# Patient Record
Sex: Male | Born: 1955 | Race: White | Hispanic: No | Marital: Single | Smoking: Former smoker
Health system: Southern US, Community
[De-identification: ages and names within clinical notes are randomized; demographics above are authoritative.]

## PROBLEM LIST (undated history)

## (undated) DIAGNOSIS — F419 Anxiety disorder, unspecified: Secondary | ICD-10-CM

## (undated) DIAGNOSIS — E079 Disorder of thyroid, unspecified: Secondary | ICD-10-CM

## (undated) DIAGNOSIS — C801 Malignant (primary) neoplasm, unspecified: Secondary | ICD-10-CM

## (undated) DIAGNOSIS — K219 Gastro-esophageal reflux disease without esophagitis: Secondary | ICD-10-CM

## (undated) DIAGNOSIS — F1010 Alcohol abuse, uncomplicated: Secondary | ICD-10-CM

## (undated) DIAGNOSIS — G4730 Sleep apnea, unspecified: Secondary | ICD-10-CM

## (undated) DIAGNOSIS — I10 Essential (primary) hypertension: Secondary | ICD-10-CM

## (undated) DIAGNOSIS — E785 Hyperlipidemia, unspecified: Secondary | ICD-10-CM

## (undated) HISTORY — PX: COLONOSCOPY: SHX5424

## (undated) HISTORY — DX: Hyperlipidemia, unspecified: E78.5

## (undated) HISTORY — DX: Essential (primary) hypertension: I10

## (undated) HISTORY — DX: Anxiety disorder, unspecified: F41.9

## (undated) HISTORY — DX: Disorder of thyroid, unspecified: E07.9

## (undated) HISTORY — PX: UPPER GASTROINTESTINAL ENDOSCOPY: SHX188

## (undated) HISTORY — DX: Gastro-esophageal reflux disease without esophagitis: K21.9

## (undated) HISTORY — PX: THYROIDECTOMY: SHX17

---

## 2011-03-31 DIAGNOSIS — C73 Malignant neoplasm of thyroid gland: Secondary | ICD-10-CM | POA: Insufficient documentation

## 2011-10-01 DIAGNOSIS — K279 Peptic ulcer, site unspecified, unspecified as acute or chronic, without hemorrhage or perforation: Secondary | ICD-10-CM

## 2011-10-01 HISTORY — DX: Peptic ulcer, site unspecified, unspecified as acute or chronic, without hemorrhage or perforation: K27.9

## 2016-04-30 HISTORY — PX: COLONOSCOPY: SHX174

## 2017-05-19 ENCOUNTER — Emergency Department (HOSPITAL_COMMUNITY)
Admission: EM | Admit: 2017-05-19 | Discharge: 2017-05-19 | Disposition: A | Discharge status: Home / Self Care | Payer: BC Managed Care – PPO | Attending: Emergency Medicine | Admitting: Emergency Medicine

## 2017-05-19 ENCOUNTER — Encounter (HOSPITAL_COMMUNITY): Payer: Self-pay | Admitting: Emergency Medicine

## 2017-05-19 DIAGNOSIS — F17210 Nicotine dependence, cigarettes, uncomplicated: Secondary | ICD-10-CM | POA: Insufficient documentation

## 2017-05-19 DIAGNOSIS — F1010 Alcohol abuse, uncomplicated: Secondary | ICD-10-CM | POA: Insufficient documentation

## 2017-05-19 DIAGNOSIS — F329 Major depressive disorder, single episode, unspecified: Secondary | ICD-10-CM | POA: Diagnosis not present

## 2017-05-19 DIAGNOSIS — R260 Ataxic gait: Secondary | ICD-10-CM | POA: Diagnosis present

## 2017-05-19 HISTORY — DX: Alcohol abuse, uncomplicated: F10.10

## 2017-05-19 LAB — ETHANOL: Alcohol, Ethyl (B): 394 mg/dL (ref <5)

## 2017-05-19 LAB — COMPREHENSIVE METABOLIC PANEL
ALBUMIN: 4.9 g/dL (ref 3.5–5.0)
ALK PHOS: 59 U/L (ref 38–126)
ALT: 37 U/L (ref 17–63)
ANION GAP: 20 — AB (ref 5–15)
AST: 35 U/L (ref 15–41)
BUN: 16 mg/dL (ref 6–20)
CALCIUM: 9.3 mg/dL (ref 8.9–10.3)
CO2: 18 mmol/L — AB (ref 22–32)
Chloride: 101 mmol/L (ref 101–111)
Creatinine, Ser: 0.76 mg/dL (ref 0.61–1.24)
GFR calc Af Amer: >60 mL/min (ref >60)
GFR calc non Af Amer: >60 mL/min (ref >60)
GLUCOSE: 95 mg/dL (ref 65–99)
Potassium: 3.8 mmol/L (ref 3.5–5.1)
SODIUM: 139 mmol/L (ref 135–145)
Total Bilirubin: 0.8 mg/dL (ref 0.3–1.2)
Total Protein: 8.3 g/dL — ABNORMAL HIGH (ref 6.5–8.1)

## 2017-05-19 LAB — CBC
HCT: 45.1 % (ref 39.0–52.0)
HEMOGLOBIN: 16.7 g/dL (ref 13.0–17.0)
MCH: 33.2 pg (ref 26.0–34.0)
MCHC: 37 g/dL — ABNORMAL HIGH (ref 30.0–36.0)
MCV: 89.7 fL (ref 78.0–100.0)
Platelets: 303 10*3/uL (ref 150–400)
RBC: 5.03 MIL/uL (ref 4.22–5.81)
RDW: 12.9 % (ref 11.5–15.5)
WBC: 10.7 10*3/uL — ABNORMAL HIGH (ref 4.0–10.5)

## 2017-05-19 NOTE — Discharge Instructions (Signed)
Follow up with out patient treatment of alcohol abuse
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
## 2017-05-19 NOTE — ED Notes (Signed)
MD made aware that pt has arrived.

## 2017-05-19 NOTE — ED Notes (Signed)
Bed: [facility] Expected date:  Expected time:  Means of arrival:  Comments: 

## 2017-05-19 NOTE — ED Notes (Signed)
Spoke with male officer who verbalizes en route with pt to hospital.

## 2017-05-19 NOTE — ED Notes (Addendum)
Spoke with Roderic Palau, MD regarding pt status and complaint. Verbal to place CMP, CBC, and ethanol level.   Pt ambulated with steady gait to consultation room for blood draw. Pt tearful.

## 2017-05-19 NOTE — ED Provider Notes (Signed)
[name]   CSN: [id number] Arrival date & time: 05/19/17  1133     History   Chief Complaint Chief Complaint  Patient presents with  . Alcohol Intoxication    HPI Vernon Fowler is a 61 y.o. male.  Patient brought in by police for alcohol abuse.   The history is provided by the patient. No language interpreter was used.  Alcohol Intoxication  This is a chronic problem. The current episode started more than 1 week ago. The problem occurs constantly. The problem has not changed since onset.Pertinent negatives include no chest pain, no abdominal pain and no headaches. Nothing aggravates the symptoms. Nothing relieves the symptoms.    Past Medical History:  Diagnosis Date  . Alcohol abuse     There are no active problems to display for this patient.   History reviewed. No pertinent surgical history.     Home Medications    Prior to Admission medications   Not on File    Family History No family history on file.  Social History Social History  Substance Use Topics  . Smoking status: Current Every Day Smoker    Types: Cigarettes  . Smokeless tobacco: Not on file  . Alcohol use Yes     Allergies   Patient has no known allergies.   Review of Systems Review of Systems  Constitutional: Negative for appetite change and fatigue.  HENT: Negative for congestion, ear discharge and sinus pressure.   Eyes: Negative for discharge.  Respiratory: Negative for cough.   Cardiovascular: Negative for chest pain.  Gastrointestinal: Negative for abdominal pain and diarrhea.  Genitourinary: Negative for frequency and hematuria.  Musculoskeletal: Negative for back pain.  Skin: Negative for rash.  Neurological: Negative for seizures and headaches.  Psychiatric/Behavioral: Positive for dysphoric mood. Negative for hallucinations.     Physical Exam Updated Vital Signs BP 127/86 (BP Location: Right Arm)   Pulse 78   Temp 98.3 F (36.8 C)  (Oral)   Resp 16   SpO2 94%   Physical Exam  Constitutional: He is oriented to person, place, and time. He appears well-developed.  HENT:  Head: Normocephalic.  Eyes: Conjunctivae and EOM are normal. No scleral icterus.  Neck: Neck supple. No thyromegaly present.  Cardiovascular: Normal rate and regular rhythm.  Exam reveals no gallop and no friction rub.   No murmur heard. Pulmonary/Chest: No stridor. He has no wheezes. He has no rales. He exhibits no tenderness.  Abdominal: He exhibits no distension. There is no tenderness. There is no rebound.  Musculoskeletal: Normal range of motion. He exhibits no edema.  Lymphadenopathy:    He has no cervical adenopathy.  Neurological: He is oriented to person, place, and time. He exhibits normal muscle tone. Coordination normal.  Mildly ataxic  Skin: No rash noted. No erythema.  Psychiatric:  Patient depressed but not suicidal or homicidal     ED Treatments / Results  Labs (all labs ordered are listed, but only abnormal results are displayed) Labs Reviewed  CBC - Abnormal; Notable for the following:       Result Value   WBC 10.7 (*)    MCHC 37.0 (*)    All other components within normal limits  COMPREHENSIVE METABOLIC PANEL - Abnormal; Notable for the following:    CO2 18 (*)    Total Protein 8.3 (*)    Anion gap 20 (*)    All other components within normal limits  ETHANOL - Abnormal; Notable for the following:  Alcohol, Ethyl (B) 394 (*)    All other components within normal limits    EKG  EKG Interpretation None       Radiology No results found.  Procedures Procedures (including critical care time)  Medications Ordered in ED Medications - No data to display   Initial Impression / Assessment and Plan / ED Course  I have reviewed the triage vital signs and the nursing notes.  Pertinent labs & imaging results that were available during my care of the patient were reviewed by me and considered in my medical  decision making (see chart for details).     Patient with alcohol abuse. He is going be taken away by the police. And then will follow-up for alcohol abuse evident outpatient  Final Clinical Impressions(s) / ED Diagnoses   Final diagnoses:  ETOH abuse    New Prescriptions New Prescriptions   No medications on file     Milton Ferguson, MD 05/19/17 1434

## 2017-05-19 NOTE — ED Notes (Addendum)
Per Beulah Gandy, [facility] police left with pt after blood draw. This Probation officer informed Rosita Fire, EMT and Oran Rein, off duty [employer] that pt is not medically cleared by EDP. Oran Rein, off duty [employer] calling info channel to inform pt is not medically cleared from hospital. Hanley Seamen, charge RN and Roderic Palau, MD aware of all the above.

## 2017-06-10 ENCOUNTER — Other Ambulatory Visit (HOSPITAL_COMMUNITY): Payer: Self-pay | Admitting: Ophthalmology

## 2017-06-10 ENCOUNTER — Ambulatory Visit (HOSPITAL_COMMUNITY)
Admission: RE | Admit: 2017-06-10 | Discharge: 2017-06-10 | Disposition: A | Discharge status: Home / Self Care | Payer: BC Managed Care – PPO | Source: Ambulatory Visit | Attending: Cardiology | Admitting: Cardiology

## 2017-06-10 DIAGNOSIS — H3582 Retinal ischemia: Secondary | ICD-10-CM

## 2017-06-10 DIAGNOSIS — H539 Unspecified visual disturbance: Secondary | ICD-10-CM

## 2017-06-10 DIAGNOSIS — I6523 Occlusion and stenosis of bilateral carotid arteries: Secondary | ICD-10-CM | POA: Insufficient documentation

## 2017-10-15 ENCOUNTER — Encounter (HOSPITAL_COMMUNITY): Payer: Self-pay | Admitting: Emergency Medicine

## 2017-10-15 ENCOUNTER — Emergency Department (HOSPITAL_COMMUNITY)
Admission: EM | Admit: 2017-10-15 | Discharge: 2017-10-15 | Disposition: A | Discharge status: Home / Self Care | Payer: BC Managed Care – PPO | Attending: Emergency Medicine | Admitting: Emergency Medicine

## 2017-10-15 DIAGNOSIS — Z7982 Long term (current) use of aspirin: Secondary | ICD-10-CM | POA: Diagnosis not present

## 2017-10-15 DIAGNOSIS — F17210 Nicotine dependence, cigarettes, uncomplicated: Secondary | ICD-10-CM | POA: Diagnosis not present

## 2017-10-15 DIAGNOSIS — Z79899 Other long term (current) drug therapy: Secondary | ICD-10-CM | POA: Insufficient documentation

## 2017-10-15 DIAGNOSIS — R0789 Other chest pain: Secondary | ICD-10-CM | POA: Diagnosis not present

## 2017-10-15 DIAGNOSIS — Y908 Blood alcohol level of 240 mg/100 ml or more: Secondary | ICD-10-CM | POA: Diagnosis not present

## 2017-10-15 DIAGNOSIS — F1010 Alcohol abuse, uncomplicated: Secondary | ICD-10-CM | POA: Diagnosis not present

## 2017-10-15 DIAGNOSIS — F1020 Alcohol dependence, uncomplicated: Secondary | ICD-10-CM | POA: Diagnosis present

## 2017-10-15 LAB — RAPID URINE DRUG SCREEN, HOSP PERFORMED
Amphetamines: NOT DETECTED
Barbiturates: NOT DETECTED
Benzodiazepines: NOT DETECTED
Cocaine: NOT DETECTED
Opiates: NOT DETECTED
Tetrahydrocannabinol: NOT DETECTED

## 2017-10-15 LAB — COMPREHENSIVE METABOLIC PANEL
ALBUMIN: 5.1 g/dL — AB (ref 3.5–5.0)
ALK PHOS: 57 U/L (ref 38–126)
ALT: 58 U/L (ref 17–63)
AST: 51 U/L — AB (ref 15–41)
Anion gap: 17 — ABNORMAL HIGH (ref 5–15)
BUN: 14 mg/dL (ref 6–20)
CALCIUM: 9.3 mg/dL (ref 8.9–10.3)
CO2: 21 mmol/L — ABNORMAL LOW (ref 22–32)
CREATININE: 0.79 mg/dL (ref 0.61–1.24)
Chloride: 100 mmol/L — ABNORMAL LOW (ref 101–111)
GFR calc non Af Amer: >60 mL/min (ref >60)
GLUCOSE: 95 mg/dL (ref 65–99)
Potassium: 4 mmol/L (ref 3.5–5.1)
Sodium: 138 mmol/L (ref 135–145)
Total Bilirubin: 1.2 mg/dL (ref 0.3–1.2)
Total Protein: 8.4 g/dL — ABNORMAL HIGH (ref 6.5–8.1)

## 2017-10-15 LAB — ETHANOL: Alcohol, Ethyl (B): 270 mg/dL — ABNORMAL HIGH (ref <10)

## 2017-10-15 LAB — CBC
HCT: 43.9 % (ref 39.0–52.0)
Hemoglobin: 16 g/dL (ref 13.0–17.0)
MCH: 34.3 pg — AB (ref 26.0–34.0)
MCHC: 36.4 g/dL — AB (ref 30.0–36.0)
MCV: 94 fL (ref 78.0–100.0)
PLATELETS: 300 10*3/uL (ref 150–400)
RBC: 4.67 MIL/uL (ref 4.22–5.81)
RDW: 14.3 % (ref 11.5–15.5)
WBC: 6.4 10*3/uL (ref 4.0–10.5)

## 2017-10-15 LAB — TROPONIN I

## 2017-10-15 MED ORDER — CHLORDIAZEPOXIDE HCL 25 MG PO CAPS: ORAL_CAPSULE | ORAL | 0 refills | Status: DC

## 2017-10-15 MED ORDER — LORAZEPAM 1 MG PO TABS
1.0000 mg | ORAL_TABLET | Freq: Once | ORAL | Status: AC
  Administered 2017-10-15: 1 mg via ORAL
  Filled 2017-10-15: qty 1

## 2017-10-15 NOTE — Patient Outreach (Signed)
CPSS met with the patient and provided substance use recovery support. Patient states his plan after discharge is to attend more AA meetings and get connected with a good sponsor. Patient had 6 months sober attending AA/NA meetings before recently relapsing. Patient received inpatient substance use treatment earlier last year at [facility]. CPSS utilized motivational interviewing skills to highlight patients strengths and hope for his substance use recovery. CPSS also provided CPSS contact information and encouraged the patient to contact CPSS at anytime for substance use recovery support.

## 2017-10-15 NOTE — ED Notes (Signed)
Pt discharged to home. DC instructions given. Pt received discharge prescription up front in the Main ED. Left unit ambulatory and stable. No concerns voiced. Hale Bogus.

## 2017-10-15 NOTE — Discharge Instructions (Signed)
As discussed, your evaluation today has been largely reassuring.  But, it is important that you monitor your condition carefully, and do not hesitate to return to the ED if you develop new, or concerning changes in your condition. ? ?Otherwise, please follow-up with your physician for appropriate ongoing care. ? ?
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
## 2017-10-15 NOTE — ED Triage Notes (Signed)
Patient here from home with complaints of etoh. Patient requesting detox. States that he has a sponsor at [facility] and was sober. States that when he detox he does not have seizures. Denies SI/HI. Last drink last night.

## 2017-10-15 NOTE — ED Notes (Signed)
Bed: WA32 Expected date:  Expected time:  Means of arrival:  Comments: 

## 2017-10-28 ENCOUNTER — Encounter: Payer: Self-pay | Admitting: Physician Assistant

## 2017-10-28 ENCOUNTER — Ambulatory Visit: Payer: BC Managed Care – PPO | Admitting: Physician Assistant

## 2017-10-28 ENCOUNTER — Other Ambulatory Visit: Payer: Self-pay

## 2017-10-28 VITALS — BP 132/70 | HR 69 | Temp 97.9°F | Resp 18 | Ht 74.0 in | Wt 209.8 lb

## 2017-10-28 DIAGNOSIS — K219 Gastro-esophageal reflux disease without esophagitis: Secondary | ICD-10-CM | POA: Diagnosis not present

## 2017-10-28 DIAGNOSIS — F411 Generalized anxiety disorder: Secondary | ICD-10-CM | POA: Diagnosis not present

## 2017-10-28 DIAGNOSIS — F1010 Alcohol abuse, uncomplicated: Secondary | ICD-10-CM | POA: Diagnosis not present

## 2017-10-28 DIAGNOSIS — I10 Essential (primary) hypertension: Secondary | ICD-10-CM

## 2017-10-28 MED ORDER — BUSPIRONE HCL 10 MG PO TABS: 10.0000 mg | ORAL_TABLET | Freq: Three times a day (TID) | ORAL | 0 refills | Status: DC

## 2017-10-28 NOTE — Patient Instructions (Addendum)
Please download the APP called mychart - then use the text to activate this APP - this will allow you to look at your labs and contact me as well as make appointments to see me in the future.     IF you received an x-ray today, you will receive an invoice from [facility]. Please contact [facility] at [phone] with questions or concerns regarding your invoice.   IF you received labwork today, you will receive an invoice from [facility]. Please contact [facility] at [phone] with questions or concerns regarding your invoice.   Our billing staff will not be able to assist you with questions regarding bills from these companies.  You will be contacted with the lab results as soon as they are available. The fastest way to get your results is to activate your My Chart account. Instructions are located on the last page of this paperwork. If you have not heard from us regarding the results in 2 weeks, please contact this office.     
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
## 2017-11-23 ENCOUNTER — Other Ambulatory Visit: Payer: Self-pay | Admitting: Physician Assistant

## 2017-11-23 DIAGNOSIS — F411 Generalized anxiety disorder: Secondary | ICD-10-CM

## 2017-12-09 ENCOUNTER — Encounter (HOSPITAL_COMMUNITY): Payer: Self-pay | Admitting: Psychology

## 2017-12-09 ENCOUNTER — Ambulatory Visit (INDEPENDENT_AMBULATORY_CARE_PROVIDER_SITE_OTHER): Payer: BC Managed Care – PPO | Admitting: Psychology

## 2017-12-09 DIAGNOSIS — F1020 Alcohol dependence, uncomplicated: Secondary | ICD-10-CM | POA: Diagnosis not present

## 2017-12-09 NOTE — Addendum Note (Signed)
Addended byAmalia Hailey, Oaklie Durrett on: 12/09/2017 01:30 PM   Modules accepted: Level of Service
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
## 2017-12-12 ENCOUNTER — Other Ambulatory Visit (HOSPITAL_COMMUNITY): Payer: BC Managed Care – PPO | Attending: Psychiatry | Admitting: Psychology

## 2017-12-12 ENCOUNTER — Encounter (HOSPITAL_COMMUNITY): Payer: Self-pay | Admitting: Psychology

## 2017-12-12 DIAGNOSIS — F1020 Alcohol dependence, uncomplicated: Secondary | ICD-10-CM | POA: Diagnosis present

## 2017-12-13 ENCOUNTER — Encounter (HOSPITAL_COMMUNITY): Payer: Self-pay | Admitting: Psychology
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
## 2017-12-13 NOTE — Progress Notes (Signed)
    Daily Group Progress Note  Program: CD-IOP   12/13/2017 Vernon Fowler [id number]  Diagnosis: F10.20 Alcohol Use Disorder  Sobriety Date: 12/12/17  Group Time: 1-2:30pm  Participation Level: Active  Behavioral Response: Sharing  Type of Therapy: Process Group  Interventions: Supportive  Topic: Process: the first part of group was spent in process. Members shared about the past weekend and any challenges or 'speed bumps' they may have faced in early recovery. A new group member was present, and he introduced himself during this half of group and shared about his struggles with alcoholism. The medical director met with one group member for her initial session and another for a medication check. Six random drug tests were collected.   Group Time: 2:30-4pm  Participation Level: Active  Behavioral Response: Sharing  Type of Therapy: Psycho-education Group  Interventions: Communication  Topic: Psycho-Ed: Communication: The four communication styles: identifying your style. The second half of group was spent in a psycho-ed on communication. A handout was provided identifying the four primary styles, including passive, passive-aggressive, aggressive and, assertive. Members shared in reading the different styles and sharing about their role models in communication styles. Most of the group had poor models in their families and aggressive was the primary style identified. The discussion was lively, and members shared in greater detail about the way they first learned to communicate.  Summary: The patient was new to the group and introduced himself in the first half of group. The patient shared that he was a kind of expert on residential treatment having been in three facilities in the last 12 months. He has been sober for almost seven months, but most of that sober time was while he was in treatment. He reported he had relapsed after 121 days of sobriety. He identified his challenge is  to build accountability and keep busy. He lives alone and is not working. "I isolate", he explained. Patient also shared about his separation from his wife and her refusal to let him have his dog. The patent admitted he had not attended any meetings on Saturday or Sunday and had drank on both days. He attributed his drinking to 'sports'. He had watched the Master's both days and drank. 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 He believes that this is part of his issue. The patient provided helpful feedback to another group member and responded well in his first group session.   UDS collected: Yes Results: pending  AA/NA attended?: No  Sponsor?: Yes   Brandon Melnick, LCAS 12/13/2017 2:08 PM

## 2017-12-14 ENCOUNTER — Other Ambulatory Visit (HOSPITAL_COMMUNITY): Payer: BC Managed Care – PPO

## 2017-12-15 ENCOUNTER — Telehealth (HOSPITAL_COMMUNITY): Payer: Self-pay | Admitting: Psychology

## 2017-12-15 ENCOUNTER — Other Ambulatory Visit (HOSPITAL_COMMUNITY): Payer: BC Managed Care – PPO

## 2017-12-19 ENCOUNTER — Telehealth (HOSPITAL_COMMUNITY): Payer: Self-pay | Admitting: Psychology

## 2017-12-19 ENCOUNTER — Other Ambulatory Visit (HOSPITAL_COMMUNITY): Payer: BC Managed Care – PPO

## 2017-12-20 ENCOUNTER — Other Ambulatory Visit: Payer: Self-pay | Admitting: Physician Assistant

## 2017-12-20 DIAGNOSIS — F411 Generalized anxiety disorder: Secondary | ICD-10-CM

## 2017-12-21 ENCOUNTER — Other Ambulatory Visit (HOSPITAL_COMMUNITY): Payer: BC Managed Care – PPO

## 2017-12-22 ENCOUNTER — Other Ambulatory Visit (HOSPITAL_COMMUNITY): Payer: BC Managed Care – PPO

## 2017-12-23 DIAGNOSIS — M720 Palmar fascial fibromatosis [Dupuytren]: Secondary | ICD-10-CM | POA: Insufficient documentation

## 2017-12-23 HISTORY — DX: Palmar fascial fibromatosis (dupuytren): M72.0

## 2017-12-24 ENCOUNTER — Other Ambulatory Visit: Payer: Self-pay | Admitting: Physician Assistant

## 2017-12-24 DIAGNOSIS — F411 Generalized anxiety disorder: Secondary | ICD-10-CM

## 2017-12-26 ENCOUNTER — Other Ambulatory Visit (HOSPITAL_COMMUNITY): Payer: BC Managed Care – PPO | Admitting: Medical

## 2017-12-27 ENCOUNTER — Telehealth (HOSPITAL_COMMUNITY): Payer: Self-pay | Admitting: Psychology

## 2017-12-28 ENCOUNTER — Other Ambulatory Visit (INDEPENDENT_AMBULATORY_CARE_PROVIDER_SITE_OTHER): Payer: BC Managed Care – PPO | Admitting: Medical

## 2017-12-28 ENCOUNTER — Other Ambulatory Visit (HOSPITAL_COMMUNITY): Payer: Self-pay | Admitting: Medical

## 2017-12-28 DIAGNOSIS — F1020 Alcohol dependence, uncomplicated: Secondary | ICD-10-CM

## 2017-12-28 NOTE — Progress Notes (Signed)
  [place] Dependency Intensive Outpatient Discharge Summary   Chue Berkovich [id number]  Date of Admission: 12/26/2017 Date of Discharge: 12/28/2017  Course of Treatment: Pt withdrew after 1 Group citing need to attend to outside issues  Goals and Activities to Help Maintain Sobriety: 1. Stay away from old friends who continue to drink and use mind-altering chemicals. 2. Continue practicing Fair Fighting rules in interpersonal conflicts. 3. Continue alcohol and drug refusal skills and call on support systems.  Referrals: NA   Aftercare services: NA 1. Attend AA/NA meetings as ofter as you used 2. Obtain a sponsor and a home group in [place].    Client has NOT participated in the development of this discharge plan and has NOT received a copy of this completed plan  Darlyne Russian  12/28/2017   Darlyne Russian, PA-C 12/28/2017

## 2017-12-29 ENCOUNTER — Other Ambulatory Visit (HOSPITAL_COMMUNITY): Payer: BC Managed Care – PPO

## 2017-12-29 ENCOUNTER — Encounter (HOSPITAL_COMMUNITY): Payer: Self-pay | Admitting: Medical

## 2017-12-29 NOTE — Progress Notes (Signed)
  [place] Dependency Intensive Outpatient Discharge Summary   Vernon Fowler [id number]  Date of Admission: 12/26/2017 Date of Discharge: 12/28/2017  Course of Treatment: Pt withdrew from treatment due to his perception that he had too many outside issues he neded to attend to that precluded his coming to treatment,  Goals and Activities to Help Maintain Sobriety: 1. Stay away from old friends who continue to drink and use mind-altering chemicals. 2. Continue practicing Fair Fighting rules in interpersonal conflicts. 3. Continue alcohol and drug refusal skills and call on support systems. 4. Keep scheduled appts with providers  Referrals: NA   Aftercare services: NA 1. Attend AA/NA meetings as often as you drank 2. Obtain a sponsor and a home group in [place].        Client has NOT participated in the development of this discharge plan and has NOT received a copy of this completed plan  Darlyne Russian  12/29/2017   Darlyne Russian, PA-C 12/29/2017

## 2018-01-02 ENCOUNTER — Other Ambulatory Visit (HOSPITAL_COMMUNITY): Payer: BC Managed Care – PPO

## 2018-01-04 ENCOUNTER — Other Ambulatory Visit (HOSPITAL_COMMUNITY): Payer: BC Managed Care – PPO

## 2018-01-05 ENCOUNTER — Other Ambulatory Visit (HOSPITAL_COMMUNITY): Payer: BC Managed Care – PPO

## 2018-01-08 DIAGNOSIS — E039 Hypothyroidism, unspecified: Secondary | ICD-10-CM | POA: Diagnosis present

## 2018-01-08 DIAGNOSIS — E785 Hyperlipidemia, unspecified: Secondary | ICD-10-CM | POA: Diagnosis present

## 2018-01-09 ENCOUNTER — Other Ambulatory Visit (HOSPITAL_COMMUNITY): Payer: BC Managed Care – PPO

## 2018-01-11 ENCOUNTER — Other Ambulatory Visit (HOSPITAL_COMMUNITY): Payer: BC Managed Care – PPO

## 2018-01-12 ENCOUNTER — Other Ambulatory Visit (HOSPITAL_COMMUNITY): Payer: BC Managed Care – PPO

## 2018-01-13 ENCOUNTER — Other Ambulatory Visit: Payer: Self-pay | Admitting: Physician Assistant

## 2018-01-13 ENCOUNTER — Telehealth: Payer: Self-pay | Admitting: Physician Assistant

## 2018-01-13 DIAGNOSIS — F411 Generalized anxiety disorder: Secondary | ICD-10-CM

## 2018-01-13 NOTE — Telephone Encounter (Signed)
Copied from [place] [phone]. Topic: Quick Communication - See Telephone Encounter >> Jan 13, 2018  9:50 AM Vernon Fowler wrote: CRM for notification. See Telephone encounter for: 05/17  Dr. Pablo Ledger asked him to call for a prescription on Naltrexone tablet or Vivitrol injection. Please call and let him know   Vernon Fowler [facility] [address], [place] [address] Phone: [phone] Fax: [phone]

## 2018-01-14 NOTE — Telephone Encounter (Signed)
Phone message sent to Vernon Fowler.

## 2018-01-16 ENCOUNTER — Other Ambulatory Visit (HOSPITAL_COMMUNITY): Payer: BC Managed Care – PPO

## 2018-01-17 NOTE — Telephone Encounter (Signed)
mychart message sent to pt about making an apt with [facility]

## 2018-01-17 NOTE — Telephone Encounter (Signed)
I am happy to write this for patient - please have him make an appt to discuss which would be best for him.  If we decided on th injection we will send to pharmacy at this appt and then he can go get it and then return to clinic for [place] to give.  At that appt we will discuss how we will carry out monthly injections if that is what we decide to do for him.

## 2018-01-18 ENCOUNTER — Other Ambulatory Visit (HOSPITAL_COMMUNITY): Payer: BC Managed Care – PPO

## 2018-01-19 ENCOUNTER — Other Ambulatory Visit (HOSPITAL_COMMUNITY): Payer: BC Managed Care – PPO

## 2018-01-25 ENCOUNTER — Other Ambulatory Visit (HOSPITAL_COMMUNITY): Payer: BC Managed Care – PPO

## 2018-01-26 ENCOUNTER — Other Ambulatory Visit (HOSPITAL_COMMUNITY): Payer: BC Managed Care – PPO

## 2018-01-30 ENCOUNTER — Other Ambulatory Visit (HOSPITAL_COMMUNITY): Payer: BC Managed Care – PPO

## 2018-02-01 ENCOUNTER — Other Ambulatory Visit (HOSPITAL_COMMUNITY): Payer: BC Managed Care – PPO

## 2018-02-02 ENCOUNTER — Other Ambulatory Visit (HOSPITAL_COMMUNITY): Payer: BC Managed Care – PPO

## 2018-02-06 ENCOUNTER — Other Ambulatory Visit (HOSPITAL_COMMUNITY): Payer: BC Managed Care – PPO

## 2018-02-08 ENCOUNTER — Other Ambulatory Visit (HOSPITAL_COMMUNITY): Payer: BC Managed Care – PPO

## 2018-02-09 ENCOUNTER — Other Ambulatory Visit (HOSPITAL_COMMUNITY): Payer: BC Managed Care – PPO

## 2018-02-12 ENCOUNTER — Other Ambulatory Visit: Payer: Self-pay | Admitting: Physician Assistant

## 2018-02-12 DIAGNOSIS — F411 Generalized anxiety disorder: Secondary | ICD-10-CM

## 2018-02-13 ENCOUNTER — Other Ambulatory Visit (HOSPITAL_COMMUNITY): Payer: BC Managed Care – PPO

## 2018-02-15 ENCOUNTER — Other Ambulatory Visit (HOSPITAL_COMMUNITY): Payer: BC Managed Care – PPO

## 2018-02-16 ENCOUNTER — Other Ambulatory Visit (HOSPITAL_COMMUNITY): Payer: BC Managed Care – PPO

## 2018-02-20 ENCOUNTER — Other Ambulatory Visit: Payer: Self-pay | Admitting: Physician Assistant

## 2018-02-20 ENCOUNTER — Other Ambulatory Visit (HOSPITAL_COMMUNITY): Payer: BC Managed Care – PPO

## 2018-02-20 DIAGNOSIS — F411 Generalized anxiety disorder: Secondary | ICD-10-CM

## 2018-02-22 ENCOUNTER — Other Ambulatory Visit (HOSPITAL_COMMUNITY): Payer: BC Managed Care – PPO

## 2018-02-23 ENCOUNTER — Other Ambulatory Visit (HOSPITAL_COMMUNITY): Payer: BC Managed Care – PPO

## 2018-02-27 ENCOUNTER — Other Ambulatory Visit (HOSPITAL_COMMUNITY): Payer: BC Managed Care – PPO

## 2018-03-01 ENCOUNTER — Other Ambulatory Visit (HOSPITAL_COMMUNITY): Payer: BC Managed Care – PPO

## 2018-03-06 ENCOUNTER — Other Ambulatory Visit (HOSPITAL_COMMUNITY): Payer: BC Managed Care – PPO

## 2018-03-08 ENCOUNTER — Other Ambulatory Visit (HOSPITAL_COMMUNITY): Payer: BC Managed Care – PPO

## 2018-03-08 ENCOUNTER — Institutional Professional Consult (permissible substitution): Payer: Self-pay | Admitting: Pulmonary Disease

## 2018-03-09 ENCOUNTER — Other Ambulatory Visit (HOSPITAL_COMMUNITY): Payer: BC Managed Care – PPO

## 2018-03-13 ENCOUNTER — Other Ambulatory Visit (HOSPITAL_COMMUNITY): Payer: BC Managed Care – PPO

## 2018-03-16 ENCOUNTER — Other Ambulatory Visit: Payer: Self-pay | Admitting: Physician Assistant

## 2018-03-16 DIAGNOSIS — F411 Generalized anxiety disorder: Secondary | ICD-10-CM

## 2018-04-13 ENCOUNTER — Telehealth: Payer: Self-pay | Admitting: Physician Assistant

## 2018-04-13 DIAGNOSIS — F411 Generalized anxiety disorder: Secondary | ICD-10-CM

## 2018-04-14 ENCOUNTER — Ambulatory Visit (INDEPENDENT_AMBULATORY_CARE_PROVIDER_SITE_OTHER): Payer: BC Managed Care – PPO | Admitting: Pulmonary Disease

## 2018-04-14 ENCOUNTER — Encounter: Payer: Self-pay | Admitting: Pulmonary Disease

## 2018-04-14 VITALS — BP 126/70 | HR 68 | Ht 74.0 in | Wt 208.0 lb

## 2018-04-14 DIAGNOSIS — K22719 Barrett's esophagus with dysplasia, unspecified: Secondary | ICD-10-CM | POA: Diagnosis not present

## 2018-04-14 DIAGNOSIS — G4733 Obstructive sleep apnea (adult) (pediatric): Secondary | ICD-10-CM

## 2018-04-14 NOTE — Progress Notes (Signed)
[facility]  Chief Complaint  Patient presents with  . sleep consult    self referral currently on CPAP    Constitutional: BP 126/70 (BP Location: Left Arm, Cuff Size: Normal)   Pulse 68   Ht 6\' 2"  (1.88 m)   Wt 208 lb (94.3 kg)   SpO2 98%   BMI 26.71 kg/m   History of Present Illness: Vernon Fowler is a 62 y.o. male with obstructive sleep apnea.  He has a sleep study in 2001 while living in [place].  He has been using CPAP since.  He thinks his current machine is about 110 to 62 years old.  He gets his supplies from a mail order company.  He would like to establish a local DME and wants more regular follow up.  He feels CPAP helps and no issues with mask fit.    He goes to sleep at 11 pm.  He falls asleep in 15 minutes.  He wakes up some times to use the bathroom.  He gets out of bed at 8 am.  He feels rested in the morning.  He denies morning headache.  He does not use anything to help him fall sleep or stay awake.  He denies sleep walking, sleep talking, bruxism, or nightmares.  There is no history of restless legs.  He denies sleep hallucinations, sleep paralysis, or cataplexy.  The Epworth score is 0 out of 24.  He has history of reflux and Barrett's esophagus.  He was seen by GI in [facility].  He needs GI in [place].  Comprehensive Respiratory Exam:  Appearance - well kempt  ENMT - nasal mucosa moist, turbinates clear, midline nasal septum, no dental lesions, no gingival bleeding, no oral exudates, no tonsillar hypertrophy Neck - no masses, trachea midline, no thyromegaly, no elevation in JVP Respiratory - normal appearance of chest wall, normal respiratory effort w/o accessory muscle use, no dullness on percussion, no wheezing or rales CV - s1s2 regular rate and rhythm, no murmurs, no peripheral edema, radial pulses symmetric GI - soft, non tender, no masses, no hepatosplenomegaly Lymph - no adenopathy noted in neck and axillary  areas MSK - normal muscle strength and tone, normal gait Ext - no cyanosis, clubbing, or joint inflammation noted Skin - no rashes, lesions, or ulcers Neuro - oriented to person, place, and time Psych - normal mood and affect   Assessment/Plan:  Obstructive sleep apnea. - will arrange for home sleep study to assess current status of sleep apnea - will then arrange for local DME for supplies and determine if he needs to get a new machine - he is compliant with CPAP and reports benefit - continue CPAP 9 cm H2O  Barrett's Esophagus. - will arrange for referral to GI in [place] for continued monitoring  Cardiovascular risk. - had an extensive discussion regarding the adverse health consequences related to untreated sleep disordered breathing - specifically discussed the risks for hypertension, coronary artery disease, cardiac dysrhythmias, cerebrovascular disease, and diabetes - lifestyle modification discussed  Safe driving practices. - discussed how sleep disruption can increase risk of accidents, particularly when driving - safe driving practices were discussed  Therapies for obstructive sleep apnea. - if the sleep study shows significant sleep apnea, then various therapies for treatment were reviewed: CPAP, oral appliance, and surgical interventions    Patient Instructions  Will arrange for referral to Gastroenterology to assess status of Barrett's Esophagus  Will arrange for home sleep study  Follow up in 4 months    Chesley Mires, MD [place] 04/14/2018, 5:06 PM  Flow Sheet  Sleep tests: CPAP 01/14/18 to 04/13/18 >> used on 63 of 90 nights with average 7 hrs 35 min.  Average AHI 1.5 with CPAP 9 cm H2O  Review of Systems: Constitutional: Negative.   HENT: Negative.   Eyes: Negative.   Respiratory: Positive for apnea.   Cardiovascular: Negative.   Gastrointestinal: Negative.   Endocrine: Negative.   Genitourinary: Negative.   Musculoskeletal:  Negative.   Skin: Negative.   Allergic/Immunologic: Negative.   Neurological: Positive for headaches.  Hematological: Negative.   Psychiatric/Behavioral: Positive for sleep disturbance.   Past Medical History: He  has a past medical history of Alcohol abuse, Anxiety, GERD (gastroesophageal reflux disease), and Hypertension.  Past Surgical History: He  has no past surgical history on file.  Family History: His family history includes Anxiety disorder in his daughter and mother; Cancer in his maternal grandmother, mother, and paternal grandmother; Diabetes in his father; Heart disease in his paternal grandfather; Hyperlipidemia in his father; Hypertension in his father.  Social History: He  reports that he has quit smoking. His smoking use included cigarettes. He has never used smokeless tobacco. He reports that he drinks alcohol. He reports that he does not use drugs.  Medications: Allergies as of 04/14/2018      Reactions   Sulfa Antibiotics Rash   Fixed based skin reaction      Medication List        Accurate as of 04/14/18  5:06 PM. Always use your most recent med list.          aspirin EC 81 MG tablet Take 81 mg by mouth 3 (three) times a week.   busPIRone 10 MG tablet Commonly known as:  BUSPAR TAKE ONE TABLET BY MOUTH THREE TIMES A DAY   carboxymethylcellulose 0.5 % Soln Commonly known as:  REFRESH PLUS Place 1 drop into both eyes daily.   COQ10 PO Take 1 capsule by mouth daily.   FISH OIL PO Take 1 capsule by mouth daily.   gabapentin 100 MG capsule Commonly known as:  NEURONTIN Take by mouth.   irbesartan 150 MG tablet Commonly known as:  AVAPRO Take 150 mg by mouth daily.   levothyroxine 200 MCG tablet Commonly known as:  SYNTHROID, LEVOTHROID Take 200 mcg by mouth See admin instructions. Take 1 tablet (200 mcg) by mouth daily, except on Sundays (6 days a week)   multivitamin with minerals Tabs tablet Take 1 tablet by mouth daily. Men's 50+     omeprazole 20 MG capsule Commonly known as:  PRILOSEC Take 20 mg by mouth daily.   propranolol 20 MG tablet Commonly known as:  INDERAL Take 20 mg by mouth 2 (two) times daily.   rosuvastatin 10 MG tablet Commonly known as:  CRESTOR Take 10 mg by mouth daily.   traZODone 50 MG tablet Commonly known as:  DESYREL Take by mouth.

## 2018-04-14 NOTE — Progress Notes (Signed)
   Subjective:    Patient ID: Vernon Fowler, male    DOB: Feb 15, 1956, 62 y.o.   MRN: [id number]  HPI    Review of Systems  Constitutional: Negative.   HENT: Negative.   Eyes: Negative.   Respiratory: Positive for apnea.   Cardiovascular: Negative.   Gastrointestinal: Negative.   Endocrine: Negative.   Genitourinary: Negative.   Musculoskeletal: Negative.   Skin: Negative.   Allergic/Immunologic: Negative.   Neurological: Positive for headaches.  Hematological: Negative.   Psychiatric/Behavioral: Positive for sleep disturbance.       Objective:   Physical Exam        Assessment & Plan:

## 2018-04-14 NOTE — Patient Instructions (Signed)
Will arrange for referral to Gastroenterology to assess status of Barrett's Esophagus  Will arrange for home sleep study  Follow up in 4 months

## 2018-04-17 MED ORDER — BUSPIRONE HCL 10 MG PO TABS: 10.0000 mg | ORAL_TABLET | Freq: Three times a day (TID) | ORAL | 0 refills | Status: DC

## 2018-04-17 NOTE — Addendum Note (Signed)
Addended by: Elliot Cousin on: 04/17/2018 10:58 AM   Modules accepted: Orders

## 2018-04-17 NOTE — Telephone Encounter (Signed)
Patient called and states he needs a refill of his busPIRone (BUSPAR) 10 MG tablet . He has an appt for a med refill on 04/25/18. He is wondering if a weeks worth of pills can be sent to his pharmacy     [facility] [address] [place] [phone] (Phone) [phone] (Fax)

## 2018-04-25 ENCOUNTER — Ambulatory Visit: Payer: BC Managed Care – PPO | Admitting: Physician Assistant

## 2018-04-27 ENCOUNTER — Other Ambulatory Visit: Payer: Self-pay | Admitting: Physician Assistant

## 2018-04-27 DIAGNOSIS — F411 Generalized anxiety disorder: Secondary | ICD-10-CM

## 2018-05-04 ENCOUNTER — Other Ambulatory Visit: Payer: Self-pay | Admitting: Physician Assistant

## 2018-05-04 DIAGNOSIS — F411 Generalized anxiety disorder: Secondary | ICD-10-CM

## 2018-06-19 ENCOUNTER — Telehealth: Payer: Self-pay | Admitting: Pulmonary Disease

## 2018-06-19 DIAGNOSIS — G4733 Obstructive sleep apnea (adult) (pediatric): Secondary | ICD-10-CM

## 2018-06-19 NOTE — Telephone Encounter (Signed)
Spoke with pt, requesting an order for a new cpap machine.  Pt currently has a Resmed S9 through [facility].  Phone #: [phone].  Pt states he has had current machine approx 6 years.  Also states that this was discussed at his last OV.  VS please advise if ok to order new machine, and if so what pressure settings.  Thanks!

## 2018-06-20 NOTE — Telephone Encounter (Signed)
Order has been placed for the split night study.  Called and spoke with Vernon Fowler letting him know this information. Vernon Fowler expressed understanding. Nothing further needed.  Nothing further needed.

## 2018-06-20 NOTE — Telephone Encounter (Signed)
Dr. Halford Chessman, please advise if you are okay with [place] ordering a split-night study for pt. Thanks!

## 2018-06-20 NOTE — Telephone Encounter (Signed)
PCC's please advise for [place] a status update on when pt can be able to do the home sleep study. Thanks!

## 2018-06-20 NOTE — Telephone Encounter (Signed)
This patient has [facility] that will require an in-lab split-night study.  Our records show that [facility] sent Dr. Halford Chessman a message in August requesting this.  I do not see where a split night has been ordered.

## 2018-06-20 NOTE — Telephone Encounter (Signed)
I do not recall receiving a message from [place] and there is nothing in Epic related to this.  Okay to place order for split night sleep study.

## 2018-06-20 NOTE — Telephone Encounter (Signed)
He was seen in consultation on 04/14/18.  He was to have home sleep study done to determine current status of sleep apnea and then arrange for new DME in [place] area.  Please check status of when he will be able to do home sleep study.  Once this is done, we can then send an order to arrange for a new DME and new CPAP machine.

## 2018-06-23 ENCOUNTER — Encounter: Payer: Self-pay | Admitting: Family Medicine

## 2018-08-02 ENCOUNTER — Encounter (HOSPITAL_BASED_OUTPATIENT_CLINIC_OR_DEPARTMENT_OTHER): Payer: Self-pay

## 2018-08-04 DIAGNOSIS — K2270 Barrett's esophagus without dysplasia: Secondary | ICD-10-CM | POA: Diagnosis present

## 2018-08-08 ENCOUNTER — Other Ambulatory Visit: Payer: Self-pay | Admitting: Internal Medicine

## 2018-08-08 DIAGNOSIS — F17210 Nicotine dependence, cigarettes, uncomplicated: Secondary | ICD-10-CM

## 2018-08-11 ENCOUNTER — Ambulatory Visit
Admission: RE | Admit: 2018-08-11 | Discharge: 2018-08-11 | Disposition: A | Discharge status: Home / Self Care | Payer: BC Managed Care – PPO | Source: Ambulatory Visit | Attending: Internal Medicine | Admitting: Internal Medicine

## 2018-08-11 DIAGNOSIS — F17210 Nicotine dependence, cigarettes, uncomplicated: Secondary | ICD-10-CM

## 2018-08-14 ENCOUNTER — Other Ambulatory Visit: Payer: Self-pay | Admitting: Internal Medicine

## 2018-08-14 DIAGNOSIS — R911 Solitary pulmonary nodule: Secondary | ICD-10-CM

## 2018-08-18 ENCOUNTER — Ambulatory Visit
Admission: RE | Admit: 2018-08-18 | Discharge: 2018-08-18 | Disposition: A | Discharge status: Home / Self Care | Payer: BC Managed Care – PPO | Source: Ambulatory Visit | Attending: Internal Medicine | Admitting: Internal Medicine

## 2018-08-18 DIAGNOSIS — R911 Solitary pulmonary nodule: Secondary | ICD-10-CM

## 2018-09-01 DIAGNOSIS — K7010 Alcoholic hepatitis without ascites: Secondary | ICD-10-CM | POA: Insufficient documentation

## 2018-11-14 ENCOUNTER — Telehealth: Payer: Self-pay | Admitting: Pulmonary Disease

## 2018-11-14 NOTE — Telephone Encounter (Signed)
Called and spoke with patient, he is checking into a rehab facility tomorrow due to Alcohol detox. This treatment center does not allow CPAP machines. He will be there for 4-5 days. He is needing a letter stating that he doesn't need to use it.   VS please advise thank you.

## 2018-11-14 NOTE — Telephone Encounter (Signed)
Will still await VS' recommendations on letter.

## 2018-11-14 NOTE — Telephone Encounter (Signed)
Patient states needs letter for tomorrow.  He is going into detox tomorrow.  Will come pick up the letter.

## 2018-11-15 ENCOUNTER — Encounter: Payer: Self-pay | Admitting: *Deleted

## 2018-11-15 NOTE — Telephone Encounter (Signed)
Spoke with the pt and notified of recs per TP  He verbalized understanding  Letter faxed to [facility] at [phone]

## 2018-11-15 NOTE — Telephone Encounter (Signed)
Will send this over to APP of the morning due to [place] stating that VS is on vacation.   TP please advise, on this, thank you. Patient will need this by 12:00 today.

## 2018-11-15 NOTE — Telephone Encounter (Signed)
Patient called stating needs letter today.  Gong into detox today.  Patient phone number is [phone].

## 2018-11-15 NOTE — Telephone Encounter (Signed)
That is strange they would not allow CPAP At bedtime  , I guess if n/v may be an issue ,not sure   I guess that is fine if he is not able to use it .  I could not find his previous sleep study results to see how bad his OSA was , last ov Dr. Irving Copas for HST, did he have this done ? Last compliance showed he only used 63/90 days . So he goes without it some nights.    Can write order that he may stay off CPAP during those 5 days of rehab   . It would be best to use CPAP and safest for patient but understand he needs to detox .   Please contact office for sooner follow up if symptoms do not improve or worsen or seek emergency care

## 2018-12-19 ENCOUNTER — Other Ambulatory Visit: Payer: Self-pay

## 2018-12-19 ENCOUNTER — Ambulatory Visit (INDEPENDENT_AMBULATORY_CARE_PROVIDER_SITE_OTHER): Payer: BC Managed Care – PPO | Admitting: Gastroenterology

## 2018-12-19 ENCOUNTER — Encounter: Payer: Self-pay | Admitting: Gastroenterology

## 2018-12-19 DIAGNOSIS — K219 Gastro-esophageal reflux disease without esophagitis: Secondary | ICD-10-CM

## 2018-12-19 DIAGNOSIS — K22719 Barrett's esophagus with dysplasia, unspecified: Secondary | ICD-10-CM

## 2018-12-19 NOTE — Progress Notes (Signed)
TELEHEALTH VISIT  Referring Provider: Overton Mam, MD Primary Care Physician:  Overton Mam, MD   Tele-visit due to COVID-19 pandemic Patient requested visit virtually, consented to the virtual encounter via video enabled telemedicine application (Zoom) Contact made at: 14:23 12/19/18 Patient verified by name and date of birth Location of patient: Home Location provider: [place] [facility] Names of persons participating: Me, patient, Tinnie Gens CMA Time spent on telehealth visit: 27 minutes I discussed the limitations of evaluation and management by telemedicine. The patient expressed understanding and agreed to proceed.  Reason for Consultation:  Barrett's esophagus   IMPRESSION:  Barrett's esophagus on EGD in [place] 2-3 years ago GERD controlled on daily PPI therapy ? 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  Will obtain prior records to clarify diagnosis and surveillance recommendations.  We reviewed the natural history of Barrett's esophagus.  I recommended that [name] continue daily PPI therapy at this time.  I have encouraged him to reach out to [facility] to schedule his recommended follow-up.  PLAN: Obtain records from Dr. Lacey Jensen at [facility] (endoscopy records, pathology results, and GI office notes)  Continue omeprazole 40 mg daily (3 months, with one year of refills) I will contact the patient after reviewing Dr. Gwenyth Ober records to determine the  timing of endoscopy Abstinence from alcohol encouraged with ongoing formal alcohol counseling Follow-up with [facility] recommended recommended  I consented the patient discussing the risks, benefits, and alternatives to endoscopic evaluation. In particular, we discussed the risks that include, but are not limited to, reaction to medication, cardiopulmonary compromise, bleeding requiring blood transfusion, aspiration resulting in pneumonia, perforation requiring surgery, lack of diagnosis, severe illness requiring hospitalization, and even death. We reviewed the risk of missed lesion including polyps or even cancer. The patient acknowledges these risks and asks that we proceed.    HPI: Vernon Fowler is a 63 y.o. [employer].  Referred by Dr. Virgina Jock for Barrett's esophagus.   The history is obtained to the patient and review of his electronic health record.  I have also reviewed his history and CareEverywhere. Moved back to [place] last year from [place].   Previously followed by a gastroenterologist - Dr. Lacey Jensen - in [place]. Duodenal ulcer diagnosed in college. Lifetime history of reflux well controlled on PPI for reflux for years, breakthrough symptoms if he misses 2 or 3 consecutive doses. Last endoscopy 3 years ago showed Barrett's esophagus. This was the first time Barrett's was seen. A nutritionist told him that Barrett's always leads to cancer and this raised his concerns. His PCP, Dr. Virgina Jock, reviewed risks and treatment with him and provided significant reassurance. No symptoms associated with the Barrett's.   Colonoscopy at age 52 and again at age 31. He understands they were both normal but also mentioned a diagnosis of polyps.   He has been followed at [facility] for alcohol-related liver disease followed by  Dr. Damita Lack.  He was last seen by Griffith Citron, PA 10/27/2017.  He was initially seen in consultation in 2017.  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  Ferritin was elevated at 657 and transferrin saturation was 77%.  Serologic evaluation to exclude alpha-1 antitrypsin, Wilson's disease, and viral hepatitis was negative.  He underwent liver biopsy 01/08/2016 that showed steatosis without steatohepatitis.  3+ iron staining was noted within hepatocytes but with a peri-portal focus.  Periportal fibrosis was present but there was no bridging or centrilobular fibrosis.  He had 4 phlebotomy treatments with the removal of 756 mL each time.     He has ongoing care with mental health and substance abuse. He is not drinking any alcohol. Notes no alcohol in 32 days. Doing counseling at the [facility] and AA. He is feeling very realistic and optimistic. Motivated to liver for his two daughters.  He will follow-up with [facility] as previously planned.   He has no history of hepatic decompensation.  He has no history of jaundice, scleral icterus, ascites, or encephalopathy.  No history of GI bleeding.  Father had peptic ulcer disease. No known family history of colon cancer or polyps. No family history of liver disease.  Paternal grandmother with pancreatic cancer.  Past Medical History:  Diagnosis Date  . Alcohol abuse   . Anxiety   . GERD (gastroesophageal reflux disease)   . Hypertension     No past surgical history on file.  Current Outpatient Medications  Medication Sig Dispense Refill  . aspirin EC 81 MG tablet Take 81 mg by mouth 3 (three) times a week.    . busPIRone (BUSPAR) 10 MG tablet TAKE ONE TABLET BY MOUTH THREE TIMES A DAY 30 tablet 0  . carboxymethylcellulose (REFRESH PLUS) 0.5 % SOLN Place 1 drop into both eyes daily.    . Coenzyme Q10 (COQ10 PO) Take 1 capsule by mouth daily.    Marland Kitchen gabapentin (NEURONTIN) 100 MG capsule Take by mouth.    . irbesartan (AVAPRO) 150 MG tablet Take 150 mg by mouth daily.    Marland Kitchen levothyroxine (SYNTHROID, LEVOTHROID) 200 MCG tablet Take 200 mcg by mouth See  admin instructions. Take 1 tablet (200 mcg) by mouth daily, except on Sundays (6 days a week)  0  . Multiple Vitamin (MULTIVITAMIN WITH MINERALS) TABS tablet Take 1 tablet by mouth daily. Men's 50+    . Omega-3 Fatty Acids (FISH OIL PO) Take 1 capsule by mouth daily.    Marland Kitchen omeprazole (PRILOSEC) 20 MG capsule Take 20 mg by mouth daily.    . propranolol (INDERAL) 20 MG tablet Take 20 mg by mouth 2 (two) times daily.    . rosuvastatin (CRESTOR) 10 MG tablet Take 10 mg by mouth daily.  0  . traZODone (DESYREL) 50 MG tablet Take by mouth.     No current facility-administered medications for this visit.     Allergies as of 12/19/2018 - Review Complete 04/14/2018  Allergen Reaction Noted  . Sulfa antibiotics Rash 03/31/2011    Family History  Problem Relation Age of Onset  . Cancer Mother   . Anxiety disorder Mother   . Diabetes Father   . Hyperlipidemia Father   . Hypertension Father   . Cancer Maternal Grandmother   . Cancer Paternal Grandmother   . Heart disease Paternal Grandfather   . Anxiety disorder Daughter     Social History   Socioeconomic History  . Marital status: Single  Spouse name: Not on file  . Number of children: Not on file  . Years of education: Not on file  . Highest education level: Not on file  Occupational History  . Not on file  Social Needs  . Financial resource strain: Not on file  . Food insecurity:    Worry: Not on file    Inability: Not on file  . Transportation needs:    Medical: Not on file    Non-medical: Not on file  Tobacco Use  . Smoking status: Former Smoker    Types: Cigarettes  . Smokeless tobacco: Never Used  Substance and Sexual Activity  . Alcohol use: Yes    Comment: recovery ETOH - last drink 2/5  . Drug use: No  . Sexual activity: Not on file  Lifestyle  . Physical activity:    Days per week: Not on file    Minutes per session: Not on file  . Stress: Not on file  Relationships  . Social connections:    Talks on  phone: Not on file    Gets together: Not on file    Attends religious service: Not on file    Active member of club or organization: Not on file    Attends meetings of clubs or organizations: Not on file    Relationship status: Not on file  . Intimate partner violence:    Fear of current or ex partner: Not on file    Emotionally abused: Not on file    Physically abused: Not on file    Forced sexual activity: Not on file  Other Topics Concern  . Not on file  Social History Narrative   Works at [employer] to [employer] recently - currently separated   2 daughters - [place] and [place]    Review of Systems: ALL ROS discussed and all others negative except listed in HPI.  Physical Exam: General: in no acute distress Neuro: Alert and appropriate Psych: Normal affect and normal insight   Jeanann Balinski L. Tarri Glenn, MD, MPH [facility] 12/19/2018, 1:58 PM

## 2018-12-19 NOTE — Patient Instructions (Addendum)
The biopsies from your esophagus showed Barrett's esophagus. I have provide the ASGE paper brochure to you today for more information. I also recommend visiting the [email or web address] website for accurate information about Barrett's esophagus.  Please take your omeprazole every day. I recommend taking it 30-60 minutes prior to breakfast.   I will review the records from Dr. Lacey Jensen and let you know when your next upper endoscopy and colonoscopy should be scheduled.    It sounds like Dr. Virgina Jock provided you with good information about Barrett's esophagus.  I also recommend visiting the [email or web address] website if you would like additional information.  Congratulations on her 32 days of sobriety! Sounds like you have many good reasons to stay sober.  Please plan to follow-up at [facility] in the Hepatology Clinic as they previously recommended.   Thank you for your patience with me and our technology today! Please stay home, safe, and healthy. I look forward to meeting you in person in the future.

## 2019-02-23 NOTE — Patient Instructions (Addendum)
Weldon Nouri  02/23/2019   Your procedure is scheduled on: Thursday 03/01/2019   Report to [facility] Main  Entrance              Report to admitting at   [place] 19 TEST ON 02-26-19 @ 1:00 PM, THIS TEST MUST BE DONE BEFORE SURGERY, COME TO [place].            ONCE YOUR COVID TEST IS COMPLETED, PLEASE BEGIN THE QUARANTINE INSTRUCTIONS AS OUTLINED IN YOUR HANDOUT.   Call this number if you have problems the morning of surgery [phone]                Please bring CPAP mask and tubing to the hospital!   Remember: Do not eat food  :After Midnight.              NO SOLID FOOD AFTER MIDNIGHT THE NIGHT PRIOR TO SURGERY. NOTHING BY MOUTH EXCEPT CLEAR LIQUIDS UNTIL 3 HOURS PRIOR TO [place] SURGERY.             PLEASE FINISH ENSURE DRINK PER SURGEON ORDER 3 HOURS PRIOR TO SCHEDULED SURGERY TIME WHICH NEEDS TO BE COMPLETED AT _0940 am.   CLEAR LIQUID DIET   Foods Allowed                                                                     Foods Excluded  Coffee and tea, regular and decaf                             liquids that you cannot  Plain Jell-O in any flavor                                             see through such as: Fruit ices (not with fruit pulp)                                     milk, soups, orange juice  Iced Popsicles                                    All solid food Carbonated beverages, regular and diet                                    Cranberry, grape and apple juices Sports drinks like Gatorade Lightly seasoned clear broth or consume(fat free) Sugar, honey syrup  Sample Menu Breakfast                                Lunch  Supper Cranberry juice                    Beef broth                            Chicken broth Jell-O                                     Grape juice                           Apple juice Coffee or tea                         Jell-O                                      Popsicle                                                Coffee or tea                        Coffee or tea  _____________________________________________________________________               BRUSH YOUR TEETH MORNING OF SURGERY AND RINSE YOUR MOUTH OUT, NO CHEWING GUM CANDY OR MINTS.     Take these medicines the morning of surgery with A SIP OF WATER: Buspirone (Buspar), Gabapentin (Neurontin), Levothyroxine (Synthroid), Propranolol (Inderal), and Bupropion (Wellbutrin)                                You may not have any metal on your body including hair pins and              piercings     Do not wear jewelry,cologne, lotions, powders or  deodorant                        Men may shave face and neck.   Do not bring valuables to the hospital. [place].  Contacts, dentures or bridgework may not be worn into surgery.      Patients discharged the day of surgery will not be allowed to drive home. IF YOU ARE HAVING SURGERY AND GOING HOME THE SAME DAY, YOU MUST HAVE AN ADULT TO DRIVE YOU HOME AND BE WITH YOU FOR 24 HOURS. YOU MAY GO HOME BY TAXI OR UBER OR ORTHERWISE, BUT AN ADULT MUST ACCOMPANY YOU HOME AND STAY WITH YOU FOR 24 HOURS.  Name and phone number of your driver: Chales Abrahams [phone]               Please read over the following fact sheets you were given: _____________________________________________________________________             [facility] - Preparing for Surgery Before surgery, you can play an important role.  Because skin is not sterile,  your skin needs to be as free of germs as possible.  You can reduce the number of germs on your skin by washing with CHG (chlorahexidine gluconate) soap before surgery.  CHG is an antiseptic cleaner which kills germs and bonds with the skin to continue killing germs even after washing. Please DO NOT use if you have  an allergy to CHG or antibacterial soaps.  If your skin becomes reddened/irritated stop using the CHG and inform your nurse when you arrive at Short Stay. Do not shave (including legs and underarms) for at least 48 hours prior to the first CHG shower.  You may shave your face/neck. Please follow these instructions carefully:  1.  Shower with CHG Soap the night before surgery and the  morning of Surgery.  2.  If you choose to wash your hair, wash your hair first as usual with your  normal  shampoo.  3.  After you shampoo, rinse your hair and body thoroughly to remove the  shampoo.                           4.  Use CHG as you would any other liquid soap.  You can apply chg directly  to the skin and wash                       Gently with a scrungie or clean washcloth.  5.  Apply the CHG Soap to your body ONLY FROM THE NECK DOWN.   Do not use on face/ open                           Wound or open sores. Avoid contact with eyes, ears mouth and genitals (private parts).                       Wash face,  Genitals (private parts) with your normal soap.             6.  Wash thoroughly, paying special attention to the area where your surgery  will be performed.  7.  Thoroughly rinse your body with warm water from the neck down.  8.  DO NOT shower/wash with your normal soap after using and rinsing off  the CHG Soap.                9.  Pat yourself dry with a clean towel.            10.  Wear clean pajamas.            11.  Place clean sheets on your bed the night of your first shower and do not  sleep with pets. Day of Surgery : Do not apply any lotions/deodorants the morning of surgery.  Please wear clean clothes to the hospital/surgery center.  FAILURE TO FOLLOW THESE INSTRUCTIONS MAY RESULT IN THE CANCELLATION OF YOUR SURGERY PATIENT SIGNATURE_________________________________  NURSE  SIGNATURE__________________________________  ________________________________________________________________________   [name]  An incentive spirometer is a tool that can help keep your lungs clear and active. This tool measures how well you are filling your lungs with each breath. Taking long deep breaths may help reverse or decrease the chance of developing breathing (pulmonary) problems (especially infection) following:  A long period of time when you are unable to move or be active. BEFORE THE PROCEDURE   If the spirometer includes an  indicator to show your best effort, your nurse or respiratory therapist will set it to a desired goal.  If possible, sit up straight or lean slightly forward. Try not to slouch.  Hold the incentive spirometer in an upright position. INSTRUCTIONS FOR USE  1. Sit on the edge of your bed if possible, or sit up as far as you can in bed or on a chair. 2. Hold the incentive spirometer in an upright position. 3. Breathe out normally. 4. Place the mouthpiece in your mouth and seal your lips tightly around it. 5. Breathe in slowly and as deeply as possible, raising the piston or the ball toward the top of the column. 6. Hold your breath for 3-5 seconds or for as long as possible. Allow the piston or ball to fall to the bottom of the column. 7. Remove the mouthpiece from your mouth and breathe out normally. 8. Rest for a few seconds and repeat Steps 1 through 7 at least 10 times every 1-2 hours when you are awake. Take your time and take a few normal breaths between deep breaths. 9. The spirometer may include an indicator to show your best effort. Use the indicator as a goal to work toward during each repetition. 10. After each set of 10 deep breaths, practice coughing to be sure your lungs are clear. If you have an incision (the cut made at the time of surgery), support your incision when coughing by placing a pillow or rolled up towels firmly  against it. Once you are able to get out of bed, walk around indoors and cough well. You may stop using the incentive spirometer when instructed by your caregiver.  RISKS AND COMPLICATIONS  Take your time so you do not get dizzy or light-headed.  If you are in pain, you may need to take or ask for pain medication before doing incentive spirometry. It is harder to take a deep breath if you are having pain. AFTER USE  Rest and breathe slowly and easily.  It can be helpful to keep track of a log of your progress. Your caregiver can provide you with a simple table to help with this. If you are using the spirometer at home, follow these instructions: [place] IF:   You are having difficultly using the spirometer.  You have trouble using the spirometer as often as instructed.  Your pain medication is not giving enough relief while using the spirometer.  You develop fever of 100.5 F (38.1 C) or higher. SEEK IMMEDIATE MEDICAL CARE IF:   You cough up bloody sputum that had not been present before.  You develop fever of 102 F (38.9 C) or greater.  You develop worsening pain at or near the incision site. MAKE SURE YOU:   Understand these instructions.  Will watch your condition.  Will get help right away if you are not doing well or get worse. Document Released: 12/27/2006 Document Revised: 11/08/2011 Document Reviewed: 02/27/2007 [facility] Patient Information 2014 [place], [place].   ________________________________________________________________________

## 2019-02-26 ENCOUNTER — Encounter (HOSPITAL_COMMUNITY): Payer: Self-pay

## 2019-02-26 ENCOUNTER — Encounter (HOSPITAL_COMMUNITY)
Admission: RE | Admit: 2019-02-26 | Discharge: 2019-02-26 | Disposition: A | Discharge status: Home / Self Care | Payer: BC Managed Care – PPO | Source: Ambulatory Visit | Attending: Orthopedic Surgery | Admitting: Orthopedic Surgery

## 2019-02-26 ENCOUNTER — Other Ambulatory Visit (HOSPITAL_COMMUNITY)
Admission: RE | Admit: 2019-02-26 | Discharge: 2019-02-26 | Disposition: A | Discharge status: Home / Self Care | Payer: BC Managed Care – PPO | Source: Ambulatory Visit | Attending: Orthopedic Surgery | Admitting: Orthopedic Surgery

## 2019-02-26 ENCOUNTER — Other Ambulatory Visit: Payer: Self-pay

## 2019-02-26 DIAGNOSIS — Z01818 Encounter for other preprocedural examination: Secondary | ICD-10-CM | POA: Diagnosis not present

## 2019-02-26 DIAGNOSIS — M75101 Unspecified rotator cuff tear or rupture of right shoulder, not specified as traumatic: Secondary | ICD-10-CM | POA: Insufficient documentation

## 2019-02-26 DIAGNOSIS — Z1159 Encounter for screening for other viral diseases: Secondary | ICD-10-CM | POA: Insufficient documentation

## 2019-02-26 DIAGNOSIS — M19011 Primary osteoarthritis, right shoulder: Secondary | ICD-10-CM | POA: Diagnosis not present

## 2019-02-26 DIAGNOSIS — I10 Essential (primary) hypertension: Secondary | ICD-10-CM | POA: Insufficient documentation

## 2019-02-26 DIAGNOSIS — M7541 Impingement syndrome of right shoulder: Secondary | ICD-10-CM | POA: Diagnosis not present

## 2019-02-26 HISTORY — DX: Sleep apnea, unspecified: G47.30

## 2019-02-26 LAB — BASIC METABOLIC PANEL
Anion gap: 9 (ref 5–15)
BUN: 17 mg/dL (ref 8–23)
CO2: 28 mmol/L (ref 22–32)
Calcium: 9.7 mg/dL (ref 8.9–10.3)
Chloride: 102 mmol/L (ref 98–111)
Creatinine, Ser: 1.05 mg/dL (ref 0.61–1.24)
GFR calc Af Amer: >60 mL/min (ref >60)
GFR calc non Af Amer: >60 mL/min (ref >60)
Glucose, Bld: 134 mg/dL — ABNORMAL HIGH (ref 70–99)
Potassium: 4.2 mmol/L (ref 3.5–5.1)
Sodium: 139 mmol/L (ref 135–145)

## 2019-02-26 LAB — CBC
HCT: 43.3 % (ref 39.0–52.0)
Hemoglobin: 14.3 g/dL (ref 13.0–17.0)
MCH: 32.8 pg (ref 26.0–34.0)
MCHC: 33 g/dL (ref 30.0–36.0)
MCV: 99.3 fL (ref 80.0–100.0)
Platelets: 300 10*3/uL (ref 150–400)
RBC: 4.36 MIL/uL (ref 4.22–5.81)
RDW: 12.1 % (ref 11.5–15.5)
WBC: 5.1 10*3/uL (ref 4.0–10.5)
nRBC: 0 % (ref 0.0–0.2)

## 2019-02-26 LAB — SARS CORONAVIRUS 2 (TAT 6-24 HRS): SARS Coronavirus 2: NEGATIVE

## 2019-02-27 ENCOUNTER — Encounter (HOSPITAL_COMMUNITY): Payer: Self-pay

## 2019-03-01 ENCOUNTER — Encounter (HOSPITAL_COMMUNITY): Payer: Self-pay

## 2019-03-01 ENCOUNTER — Ambulatory Visit (HOSPITAL_COMMUNITY): Payer: BC Managed Care – PPO | Admitting: Anesthesiology

## 2019-03-01 ENCOUNTER — Ambulatory Visit (HOSPITAL_COMMUNITY)
Admission: RE | Admit: 2019-03-01 | Discharge: 2019-03-01 | Disposition: A | Discharge status: Home / Self Care | Payer: BC Managed Care – PPO | Attending: Orthopedic Surgery | Admitting: Orthopedic Surgery

## 2019-03-01 ENCOUNTER — Ambulatory Visit (HOSPITAL_COMMUNITY): Payer: BC Managed Care – PPO | Admitting: Physician Assistant

## 2019-03-01 ENCOUNTER — Encounter (HOSPITAL_COMMUNITY)
Admission: RE | Disposition: A | Discharge status: Home / Self Care | Payer: Self-pay | Source: Home / Self Care | Attending: Orthopedic Surgery

## 2019-03-01 DIAGNOSIS — G4730 Sleep apnea, unspecified: Secondary | ICD-10-CM | POA: Insufficient documentation

## 2019-03-01 DIAGNOSIS — K219 Gastro-esophageal reflux disease without esophagitis: Secondary | ICD-10-CM | POA: Insufficient documentation

## 2019-03-01 DIAGNOSIS — M7541 Impingement syndrome of right shoulder: Secondary | ICD-10-CM | POA: Diagnosis not present

## 2019-03-01 DIAGNOSIS — X58XXXA Exposure to other specified factors, initial encounter: Secondary | ICD-10-CM | POA: Insufficient documentation

## 2019-03-01 DIAGNOSIS — M19011 Primary osteoarthritis, right shoulder: Secondary | ICD-10-CM | POA: Insufficient documentation

## 2019-03-01 DIAGNOSIS — Z791 Long term (current) use of non-steroidal anti-inflammatories (NSAID): Secondary | ICD-10-CM | POA: Diagnosis not present

## 2019-03-01 DIAGNOSIS — Z8249 Family history of ischemic heart disease and other diseases of the circulatory system: Secondary | ICD-10-CM | POA: Insufficient documentation

## 2019-03-01 DIAGNOSIS — F419 Anxiety disorder, unspecified: Secondary | ICD-10-CM | POA: Diagnosis not present

## 2019-03-01 DIAGNOSIS — Z818 Family history of other mental and behavioral disorders: Secondary | ICD-10-CM | POA: Insufficient documentation

## 2019-03-01 DIAGNOSIS — Z87891 Personal history of nicotine dependence: Secondary | ICD-10-CM | POA: Diagnosis not present

## 2019-03-01 DIAGNOSIS — E890 Postprocedural hypothyroidism: Secondary | ICD-10-CM | POA: Diagnosis not present

## 2019-03-01 DIAGNOSIS — S46011D Strain of muscle(s) and tendon(s) of the rotator cuff of right shoulder, subsequent encounter: Secondary | ICD-10-CM

## 2019-03-01 DIAGNOSIS — Z809 Family history of malignant neoplasm, unspecified: Secondary | ICD-10-CM | POA: Insufficient documentation

## 2019-03-01 DIAGNOSIS — Z79899 Other long term (current) drug therapy: Secondary | ICD-10-CM | POA: Diagnosis not present

## 2019-03-01 DIAGNOSIS — I10 Essential (primary) hypertension: Secondary | ICD-10-CM | POA: Insufficient documentation

## 2019-03-01 DIAGNOSIS — M65811 Other synovitis and tenosynovitis, right shoulder: Secondary | ICD-10-CM | POA: Insufficient documentation

## 2019-03-01 DIAGNOSIS — M75121 Complete rotator cuff tear or rupture of right shoulder, not specified as traumatic: Secondary | ICD-10-CM | POA: Diagnosis not present

## 2019-03-01 HISTORY — PX: SHOULDER ARTHROSCOPY WITH ROTATOR CUFF REPAIR: SHX5685

## 2019-03-01 SURGERY — ARTHROSCOPY, SHOULDER, WITH ROTATOR CUFF REPAIR
Anesthesia: General | Laterality: Right

## 2019-03-01 MED ORDER — ROCURONIUM BROMIDE 10 MG/ML (PF) SYRINGE
PREFILLED_SYRINGE | INTRAVENOUS | Status: AC
  Filled 2019-03-01: qty 10

## 2019-03-01 MED ORDER — OXYCODONE HCL 5 MG/5ML PO SOLN: 5.0000 mg | Freq: Once | ORAL | Status: DC | PRN

## 2019-03-01 MED ORDER — DEXAMETHASONE SODIUM PHOSPHATE 10 MG/ML IJ SOLN
INTRAMUSCULAR | Status: DC | PRN
  Administered 2019-03-01: 10 mg via INTRAVENOUS

## 2019-03-01 MED ORDER — ONDANSETRON HCL 4 MG/2ML IJ SOLN
INTRAMUSCULAR | Status: DC | PRN
  Administered 2019-03-01: 4 mg via INTRAVENOUS

## 2019-03-01 MED ORDER — MIDAZOLAM HCL 2 MG/2ML IJ SOLN
INTRAMUSCULAR | Status: AC
  Administered 2019-03-01: 12:00:00 2 mg via INTRAVENOUS
  Filled 2019-03-01: qty 2

## 2019-03-01 MED ORDER — LACTATED RINGERS IV SOLN
INTRAVENOUS | Status: DC
  Administered 2019-03-01 (×2): via INTRAVENOUS

## 2019-03-01 MED ORDER — ONDANSETRON HCL 4 MG PO TABS: 4.0000 mg | ORAL_TABLET | Freq: Three times a day (TID) | ORAL | 0 refills | Status: DC | PRN

## 2019-03-01 MED ORDER — CHLORHEXIDINE GLUCONATE 4 % EX LIQD: 60.0000 mL | Freq: Once | CUTANEOUS | Status: DC

## 2019-03-01 MED ORDER — MIDAZOLAM HCL 2 MG/2ML IJ SOLN
1.0000 mg | INTRAMUSCULAR | Status: DC
  Administered 2019-03-01: 12:00:00 2 mg via INTRAVENOUS

## 2019-03-01 MED ORDER — OXYCODONE HCL 5 MG PO TABS: 5.0000 mg | ORAL_TABLET | Freq: Once | ORAL | Status: DC | PRN

## 2019-03-01 MED ORDER — ONDANSETRON HCL 4 MG/2ML IJ SOLN
INTRAMUSCULAR | Status: AC
  Filled 2019-03-01: qty 2

## 2019-03-01 MED ORDER — PROPOFOL 10 MG/ML IV BOLUS
INTRAVENOUS | Status: AC
  Filled 2019-03-01: qty 20

## 2019-03-01 MED ORDER — OXYCODONE-ACETAMINOPHEN 5-325 MG PO TABS: 1.0000 | ORAL_TABLET | ORAL | 0 refills | Status: DC | PRN

## 2019-03-01 MED ORDER — LIDOCAINE 2% (20 MG/ML) 5 ML SYRINGE
INTRAMUSCULAR | Status: AC
  Filled 2019-03-01: qty 5

## 2019-03-01 MED ORDER — NAPROXEN 500 MG PO TABS: 500.0000 mg | ORAL_TABLET | Freq: Two times a day (BID) | ORAL | 1 refills | Status: DC

## 2019-03-01 MED ORDER — BUPIVACAINE HCL (PF) 0.5 % IJ SOLN
INTRAMUSCULAR | Status: DC | PRN
  Administered 2019-03-01: 15 mL via PERINEURAL

## 2019-03-01 MED ORDER — PHENYLEPHRINE HCL (PRESSORS) 10 MG/ML IV SOLN
INTRAVENOUS | Status: AC
  Filled 2019-03-01: qty 1

## 2019-03-01 MED ORDER — EPHEDRINE SULFATE 50 MG/ML IJ SOLN
INTRAMUSCULAR | Status: DC | PRN
  Administered 2019-03-01: 5 mg via INTRAVENOUS

## 2019-03-01 MED ORDER — FENTANYL CITRATE (PF) 100 MCG/2ML IJ SOLN
INTRAMUSCULAR | Status: AC
  Filled 2019-03-01: qty 2

## 2019-03-01 MED ORDER — CYCLOBENZAPRINE HCL 10 MG PO TABS: 10.0000 mg | ORAL_TABLET | Freq: Three times a day (TID) | ORAL | 1 refills | Status: DC | PRN

## 2019-03-01 MED ORDER — BUPIVACAINE LIPOSOME 1.3 % IJ SUSP
INTRAMUSCULAR | Status: DC | PRN
  Administered 2019-03-01: 10 mL via PERINEURAL

## 2019-03-01 MED ORDER — PROPOFOL 10 MG/ML IV BOLUS
INTRAVENOUS | Status: DC | PRN
  Administered 2019-03-01: 190 mg via INTRAVENOUS

## 2019-03-01 MED ORDER — FENTANYL CITRATE (PF) 100 MCG/2ML IJ SOLN
INTRAMUSCULAR | Status: AC
  Administered 2019-03-01: 50 ug via INTRAVENOUS
  Filled 2019-03-01: qty 2

## 2019-03-01 MED ORDER — ONDANSETRON HCL 4 MG/2ML IJ SOLN: 4.0000 mg | Freq: Four times a day (QID) | INTRAMUSCULAR | Status: DC | PRN

## 2019-03-01 MED ORDER — DEXAMETHASONE SODIUM PHOSPHATE 10 MG/ML IJ SOLN
INTRAMUSCULAR | Status: AC
  Filled 2019-03-01: qty 1

## 2019-03-01 MED ORDER — FENTANYL CITRATE (PF) 100 MCG/2ML IJ SOLN: 25.0000 ug | INTRAMUSCULAR | Status: DC | PRN

## 2019-03-01 MED ORDER — GLYCOPYRROLATE 0.2 MG/ML IJ SOLN
INTRAMUSCULAR | Status: DC | PRN
  Administered 2019-03-01: 0.2 mg via INTRAVENOUS

## 2019-03-01 MED ORDER — EPHEDRINE 5 MG/ML INJ
INTRAVENOUS | Status: AC
  Filled 2019-03-01: qty 10

## 2019-03-01 MED ORDER — CEFAZOLIN SODIUM-DEXTROSE 2-4 GM/100ML-% IV SOLN
2.0000 g | INTRAVENOUS | Status: AC
  Administered 2019-03-01: 2 g via INTRAVENOUS
  Filled 2019-03-01: qty 100

## 2019-03-01 MED ORDER — SODIUM CHLORIDE 0.9 % IR SOLN
Status: DC | PRN
  Administered 2019-03-01 (×2): 6000 mL

## 2019-03-01 MED ORDER — FENTANYL CITRATE (PF) 100 MCG/2ML IJ SOLN
50.0000 ug | INTRAMUSCULAR | Status: DC
  Administered 2019-03-01: 50 ug via INTRAVENOUS

## 2019-03-01 MED ORDER — FENTANYL CITRATE (PF) 100 MCG/2ML IJ SOLN
INTRAMUSCULAR | Status: DC | PRN
  Administered 2019-03-01: 50 ug via INTRAVENOUS

## 2019-03-01 MED ORDER — SUGAMMADEX SODIUM 200 MG/2ML IV SOLN
INTRAVENOUS | Status: DC | PRN
  Administered 2019-03-01: 200 mg via INTRAVENOUS

## 2019-03-01 MED ORDER — ROCURONIUM BROMIDE 10 MG/ML (PF) SYRINGE
PREFILLED_SYRINGE | INTRAVENOUS | Status: DC | PRN
  Administered 2019-03-01: 10 mg via INTRAVENOUS
  Administered 2019-03-01: 50 mg via INTRAVENOUS

## 2019-03-01 SURGICAL SUPPLY — 66 items
ANCHOR PEEK 4.75X19.1 SWLK C (Anchor) ×3 IMPLANT
ANCHOR PEEK SWIVEL LOCK 5.5 (Anchor) ×1 IMPLANT
BLADE EXCALIBUR 4.0X13 (MISCELLANEOUS) ×2 IMPLANT
BLADE SURG SZ11 CARB STEEL (BLADE) ×2 IMPLANT
BOOTIES KNEE HIGH SLOAN (MISCELLANEOUS) ×4 IMPLANT
BURR OVAL 8 FLU 4.0X13 (MISCELLANEOUS) ×2 IMPLANT
CANNULA ACUFLEX KIT 5X76 (CANNULA) ×2 IMPLANT
CANNULA DRILOCK 5.0X75 (CANNULA) IMPLANT
CANNULA TWIST IN 8.25X7CM (CANNULA) ×1 IMPLANT
CONNECTOR 5 IN 1 STRAIGHT STRL (MISCELLANEOUS) ×2 IMPLANT
COOLER ICEMAN CLASSIC (MISCELLANEOUS) IMPLANT
COVER WAND RF STERILE (DRAPES) ×2 IMPLANT
DERMABOND ADVANCED (GAUZE/BANDAGES/DRESSINGS) ×1
DERMABOND ADVANCED .7 DNX12 (GAUZE/BANDAGES/DRESSINGS) IMPLANT
DISSECTOR  3.8MM X 13CM (MISCELLANEOUS) ×1
DISSECTOR 3.8MM X 13CM (MISCELLANEOUS) ×1 IMPLANT
DRAPE INCISE 23X17 IOBAN STRL (DRAPES) ×1
DRAPE INCISE 23X17 STRL (DRAPES) ×1 IMPLANT
DRAPE INCISE IOBAN 23X17 STRL (DRAPES) ×1 IMPLANT
DRAPE INCISE IOBAN 66X45 STRL (DRAPES) ×2 IMPLANT
DRAPE ORTHO SPLIT 77X108 STRL (DRAPES) ×2
DRAPE STERI 35X30 U-POUCH (DRAPES) ×2 IMPLANT
DRAPE SURG 17X11 SM STRL (DRAPES) ×2 IMPLANT
DRAPE SURG ORHT 6 SPLT 77X108 (DRAPES) ×2 IMPLANT
DRAPE U-SHAPE 47X51 STRL (DRAPES) ×2 IMPLANT
DRSG PAD ABDOMINAL 8X10 ST (GAUZE/BANDAGES/DRESSINGS) ×4 IMPLANT
DURAPREP 26ML APPLICATOR (WOUND CARE) ×4 IMPLANT
GAUZE SPONGE 4X4 12PLY STRL (GAUZE/BANDAGES/DRESSINGS) ×1 IMPLANT
GLOVE BIO SURGEON STRL SZ7.5 (GLOVE) ×2 IMPLANT
GLOVE BIO SURGEON STRL SZ8 (GLOVE) ×2 IMPLANT
GLOVE SS BIOGEL STRL SZ 7 (GLOVE) ×1 IMPLANT
GLOVE SS BIOGEL STRL SZ 7.5 (GLOVE) ×1 IMPLANT
GLOVE SUPERSENSE BIOGEL SZ 7 (GLOVE) ×1
GLOVE SUPERSENSE BIOGEL SZ 7.5 (GLOVE) ×1
GOWN STRL REUS W/ TWL XL LVL3 (GOWN DISPOSABLE) ×2 IMPLANT
GOWN STRL REUS W/TWL XL LVL3 (GOWN DISPOSABLE) ×2
KIT BASIN OR (CUSTOM PROCEDURE TRAY) ×2 IMPLANT
KIT SHOULDER TRACTION (DRAPES) ×2 IMPLANT
KIT TURNOVER KIT A (KITS) IMPLANT
MANIFOLD NEPTUNE II (INSTRUMENTS) ×3 IMPLANT
NDL SCORPION MULTI FIRE (NEEDLE) IMPLANT
NDL SPNL 18GX3.5 QUINCKE PK (NEEDLE) ×1 IMPLANT
NEEDLE SCORPION MULTI FIRE (NEEDLE) ×2 IMPLANT
NEEDLE SPNL 18GX3.5 QUINCKE PK (NEEDLE) ×2 IMPLANT
NS IRRIG 1000ML POUR BTL (IV SOLUTION) ×2 IMPLANT
PACK ARTHROSCOPY DSU (CUSTOM PROCEDURE TRAY) ×2 IMPLANT
PAD ARMBOARD 7.5X6 YLW CONV (MISCELLANEOUS) ×4 IMPLANT
PAD COLD SHLDR WRAP-ON (PAD) IMPLANT
PORT APPOLLO RF 90DEGREE MULTI (SURGICAL WAND) ×2 IMPLANT
PROBE APOLLO 90XL (SURGICAL WAND) ×1 IMPLANT
SLING ARM FOAM STRAP LRG (SOFTGOODS) ×1 IMPLANT
SLING ARM FOAM STRAP MED (SOFTGOODS) IMPLANT
SLING ULTRA II L (ORTHOPEDIC SUPPLIES) ×1 IMPLANT
SPONGE LAP 4X18 RFD (DISPOSABLE) IMPLANT
STRIP CLOSURE SKIN 1/2X4 (GAUZE/BANDAGES/DRESSINGS) ×2 IMPLANT
SUT FIBERWIRE #2 38 T-5 BLUE (SUTURE)
SUT MNCRL AB 3-0 PS2 18 (SUTURE) ×1 IMPLANT
SUT MNCRL AB 4-0 PS2 18 (SUTURE) ×1 IMPLANT
SUT PDS AB 0 CT 36 (SUTURE) IMPLANT
SUT TIGER TAPE 7 IN WHITE (SUTURE) ×1 IMPLANT
SUTURE FIBERWR #2 38 T-5 BLUE (SUTURE) IMPLANT
SYR 20CC LL (SYRINGE) ×2 IMPLANT
TAPE PAPER 3X10 WHT MICROPORE (GAUZE/BANDAGES/DRESSINGS) ×2 IMPLANT
TOWEL OR 17X26 10 PK STRL BLUE (TOWEL DISPOSABLE) ×4 IMPLANT
TUBING ARTHROSCOPY IRRIG 16FT (MISCELLANEOUS) ×1 IMPLANT
WATER STERILE IRR 1000ML POUR (IV SOLUTION) ×2 IMPLANT

## 2019-03-01 NOTE — Progress Notes (Signed)
Assisted Dr. Hodierne with right, ultrasound guided, interscalene  block. Side rails up, monitors on throughout procedure. See vital signs in flow sheet. Tolerated Procedure well. 

## 2019-03-01 NOTE — Op Note (Signed)
03/01/2019  2:28 PM  PATIENT:   Vernon Fowler  63 y.o. male  PRE-OPERATIVE DIAGNOSIS:  right shoulder impingement, acromioclavicular osteoarthritis, rotator cuff repair  POST-OPERATIVE DIAGNOSIS: Same with additional intraoperative findings of glenohumeral joint synovitis and labral tear  PROCEDURE:  1.  Right shoulder examination under anesthesia  2.  Right shoulder glenohumeral joint diagnostic arthroscopy  3.  Debridement of extensive degenerative labral tear  4.  Limited synovectomy  5.  Arthroscopic subacromial decompression and bursectomy  6.  Arthroscopic distal clavicle resection  7.  Arthroscopic rotator cuff repair using a double row suture bridge repair construct  SURGEON:  Salaam Battershell, Metta Clines M.D.  ASSISTANTS: Jenetta Loges, PA-C  ANESTHESIA:   General endotracheal and interscalene block with Exparel  EBL: Minimal  SPECIMEN: None  Drains: None   PATIENT DISPOSITION:  PACU - hemodynamically stable.    PLAN OF CARE: Discharge to home after PACU  Brief history:  Patient is a 63 year old male who has had chronic and progressively increasing right shoulder pain related to impingement syndrome with a recent MRI scan confirming a full-thickness rotator cuff tear.  Due to his ongoing pain and functional limitations and failure to respond to conservative management he is brought to the operating room at this time for planned right shoulder arthroscopy as described below  Preoperatively and counseled Mr. Kohlenberg regarding treatment options as well as potential risks versus benefits there.  Possible surgical complications were reviewed including bleeding, infection, neurovascular injury, persistent pain, loss of motion, anesthetic complication, and possible need for additional surgery.  He understands and accepts and agrees with our planned procedure.  Procedure in detail:  After undergoing routine preop evaluation patient received prophylactic antibiotics and  interscalene block with Exparel was established in the holding area by the anesthesia department.  Placed supine on the operating table and underwent smooth induction of a general endotracheal anesthesia.  Turned to the left lateral decubitus position on a beanbag and appropriate padded protected.  Right shoulder examination under anesthesia revealed full motion with no instability patterns noted.  Right arm was suspended at 70 degrees of abduction with 15 pounds of traction in the right shoulder girdle region was sterilely prepped and draped in standard fashion.  Timeout was called.  A posterior portal established into the glenohumeral joint anterior portal established under direct visualization.  The articular surfaces were found to be in good condition.  There was moderate synovitis and a synovectomy was performed anterosuperior.  There was degenerative labral tearing anterior superior and these areas were debrided with a shaver.  Tenosynovitis noted about the proximal biceps tendon this was also debrided.  The Arthrex wand was then used to obtain hemostasis.  Rotator cuff showed an essentially full-thickness defect involving the distal supraspinatus although on close inspection it appeared as though there may have been a very thin layer of bursal sided tissue intact.  The rotator cuff tear was debrided from the articular side to healthy tissue.  At this point final inspection and irrigation was then completed within the glenohumeral joint.  Fluid and instruments were removed.  The arm was then dropped down to 30 degrees of abduction with the arthroscope introduced into the subacromial space of the posterior portal and a direct lateral portal established in the subacromial space.  Abundant dense bursal tissue multiple adhesions were encountered and these were all divided and excised with combination the shaver and the Stryker wand.  The wand was then used to remove the periosteum from the undersurface of the  anterior half of the acromion and a subacromial decompression was performed with a bur creating a type I morphology.  A portal was then established directly anterior to the distal clavicle and the distal clavicle resection was performed with a bur.  Care was taken to confirm visualization of the entire circumference of the distal clavicle to ensure adequate removal of bone.  We then completed the subacromial/subdeltoid bursectomy.  Probing rotator cuff really show the area of the tear with there was just a very thin layer of bursal surface tissue intact and this was divided and debrided with shaver and there was a significant horizontal cleavage element to the tear and we debrided the rotator cuff back to healthy tissue and ultimately had a footprint approximate 2 and half centimeters in width.  Through a stab wound off the lateral margin of the acromion placed an Arthrex swivel lock suture anchor loaded with 2 fiber tapes.  We initially was a 475 but this did not achieve appropriate purchase so this was removed and we switch this to a 5 5 peek anchor and this allowed excellent purchase and fixation.  The 6 suture limbs were then shuttled equidistant across the width of the rotator cuff tear using the scorpion suture passer and then these were shuttled into 2 Lateral Row anchors in an alternating fashion creating a double row repair and allowing excellent apposition of the rotator cuff tendon against the bony bed and tuberosity.  I should mention that we prepared the bed on the tuberosity removing soft tissue and gently abrading the bone to bleeding base.  Upon completion was very pleased with the overall construct and suture limbs were then clipped.  Bursectomy was then completed.  Fluid and assist removed.  The portals were closed with a Monocryl and a Steri-Strip.  A dry dressing was taped about the right shoulder right and was placed into a sling immobilizer and the patient was awakened, extubated, taken care of  him stable addition.  Jenetta Loges, PA-C was used as an Environmental consultant throughout this case essential for help with positioning of the patient, positioning extremity, tissue manipulation, suture management, wound closure, and intraoperative decision-making.  Metta Clines Kenlei Safi MD   Contact # [phone]

## 2019-03-01 NOTE — Discharge Instructions (Signed)
   Kevin M. Supple, M.D., F.A.A.O.S. Orthopaedic Surgery Specializing in Arthroscopic and Reconstructive Surgery of the Shoulder [phone] [address] - [place],  [address] - Fax [phone]  POST-OP SHOULDER ARTHROSCOPIC ROTATOR CUFF  REPAIR INSTRUCTIONS  1. Call the office at [phone] to schedule your first post-op appointment 7-10 days from the date of your surgery.  2. Leave the steri-strips in place over your incisions when performing dressing changes and showering. You may remove your dressings and begin showering 72 hours from surgery. You can expect drainage that is clear to bloody in nature that occasionally will soak through your dressings. If this occurs go ahead and perform a dressing change. The drainage should lessen daily and when there is no drainage from your incisions feel free to go without a dressing.  3. Wear your sling/immobilizer at all times except to perform the exercises below or to occasionally let your arm dangle by your side to stretch your elbow. You also need to sleep in your sling immobilizer until instructed otherwise.  4. Range of motion to your elbow, wrist, and hand are encouraged 3-5 times daily. Exercise to your hand and fingers helps to reduce swelling you may experience.  5. Utilize ice to the shoulder 3-4 times minimum a day and additionally if you are experiencing pain.  6. You may one-armed drive when safely off of narcotics and muscle relaxants. You may use your hand that is in the sling to support the steering wheel only. However, should it be your right arm that is in the sling it is not to be used for gear shifting in a manual transmission.  7. Pain control following an exparel block  To help control your post-operative pain you received a nerve block  performed with Exparel which is a long acting anesthetic (numbing agent) which can provide pain relief and sensations of numbness (and relief of pain) in the operative  shoulder and arm for up to 3 days. Sometimes it provides mixed relief, meaning you may still have numbness in certain areas of the arm but can still be able to move  parts of that arm, hand, and fingers. We recommend that your prescribed pain medications  be used as needed. We do not feel it is necessary to "pre medicate" and "stay ahead" of pain.  Taking narcotic pain medications when you are not having any pain can lead to unnecessary and potentially dangerous side effects.    8. Pain medications can produce constipation along with their use. If you experience this, the use of an over the counter stool softener or laxative daily is recommended.   9. For additional questions or concerns, please do not hesitate to call the office. If after hours there is an answering service to forward your concerns to the physician on call.  POST-OP EXERCISES  Pendulum Exercises  Perform pendulum exercises while standing and bending at the waist. Support your uninvolved arm on a table or chair and allow your operated arm to hang freely. Make sure to do these exercises passively - not using you shoulder muscle.  Repeat 20 times. Do 3 sessions per day.   

## 2019-03-01 NOTE — Anesthesia Postprocedure Evaluation (Signed)
Anesthesia Post Note  Patient: Vernon Fowler  Procedure(s) Performed: Right shoulder arthroscopy, subacromial decompression, distal clavicle resection, rotator cuff repair (Right )     Patient location during evaluation: PACU Anesthesia Type: General Level of consciousness: awake and alert Pain management: pain level controlled Vital Signs Assessment: post-procedure vital signs reviewed and stable Respiratory status: spontaneous breathing, nonlabored ventilation, respiratory function stable and patient connected to nasal cannula oxygen Cardiovascular status: blood pressure returned to baseline and stable Postop Assessment: no apparent nausea or vomiting Anesthetic complications: no    Last Vitals:  Vitals:   03/01/19 1515 03/01/19 1530  BP: 131/87 131/89  Pulse: (!) 44 (!) 40  Resp: 19 12  Temp:  (!) 36.4 C  SpO2: 94% 95%    Last Pain:  Vitals:   03/01/19 1530  TempSrc:   PainSc: 0-No pain                 Veola Cafaro S

## 2019-03-01 NOTE — Anesthesia Procedure Notes (Signed)
Anesthesia Regional Block: Interscalene brachial plexus block   Pre-Anesthetic Checklist: ,, timeout performed, Correct Patient, Correct Site, Correct Laterality, Correct Procedure, Correct Position, site marked, Risks and benefits discussed,  Surgical consent,  Pre-op evaluation,  At surgeon's request and post-op pain management  Laterality: Right  Prep: chloraprep       Needles:  Injection technique: Single-shot  Needle Type: Echogenic Stimulator Needle     Needle Length: 5cm  Needle Gauge: 22     Additional Needles:   Procedures:, nerve stimulator,,,,,,,   Nerve Stimulator or Paresthesia:  Response: biceps flexion, 0.45 mA,   Additional Responses:   Narrative:  Start time: 03/01/2019 12:04 PM End time: 03/01/2019 12:11 PM Injection made incrementally with aspirations every 5 mL.  Performed by: Personally  Anesthesiologist: Albertha Ghee, MD  Additional Notes: Functioning IV was confirmed and monitors were applied.  A 35mm 22ga Arrow echogenic stimulator needle was used. Sterile prep and drape,hand hygiene and sterile gloves were used.  Negative aspiration and negative test dose prior to incremental administration of local anesthetic. The patient tolerated the procedure well.  Ultrasound guidance: relevent anatomy identified, needle position confirmed, local anesthetic spread visualized around nerve(s), vascular puncture avoided.  Image printed for medical record.

## 2019-03-01 NOTE — Transfer of Care (Signed)
Immediate Anesthesia Transfer of Care Note  Patient: Vernon Fowler  Procedure(s) Performed: Right shoulder arthroscopy, subacromial decompression, distal clavicle resection, rotator cuff repair (Right )  Patient Location: PACU  Anesthesia Type:General  Level of Consciousness: drowsy, patient cooperative and responds to stimulation  Airway & Oxygen Therapy: Patient Spontanous Breathing and Patient connected to face mask oxygen  Post-op Assessment: Report given to RN and Post -op Vital signs reviewed and stable  Post vital signs: Reviewed and stable  Last Vitals:  Vitals Value Taken Time  BP    Temp    Pulse    Resp    SpO2      Last Pain:  Vitals:   03/01/19 1212  TempSrc:   PainSc: 0-No pain      Patients Stated Pain Goal: 3 (59/74/16 [id number])  Complications: No apparent anesthesia complications

## 2019-03-01 NOTE — Anesthesia Preprocedure Evaluation (Addendum)
Anesthesia Evaluation  Patient identified by MRN, date of birth, ID band Patient awake    Reviewed: Allergy & Precautions, H&P , NPO status , Patient's Chart, lab work & pertinent test results  Airway Mallampati: II   Neck ROM: full    Dental   Pulmonary sleep apnea , former smoker,    breath sounds clear to auscultation       Cardiovascular hypertension,  Rhythm:regular Rate:Normal     Neuro/Psych PSYCHIATRIC DISORDERS Anxiety    GI/Hepatic PUD, GERD  ,(+)     substance abuse  alcohol use,   Endo/Other    Renal/GU      Musculoskeletal   Abdominal   Peds  Hematology   Anesthesia Other Findings   Reproductive/Obstetrics                             Anesthesia Physical Anesthesia Plan  ASA: II  Anesthesia Plan: General   Post-op Pain Management:  Regional for Post-op pain   Induction: Intravenous  PONV Risk Score and Plan: 2 and Ondansetron, Dexamethasone, Midazolam and Treatment may vary due to age or medical condition  Airway Management Planned: Oral ETT  Additional Equipment:   Intra-op Plan:   Post-operative Plan: Extubation in OR  Informed Consent: I have reviewed the patients History and Physical, chart, labs and discussed the procedure including the risks, benefits and alternatives for the proposed anesthesia with the patient or authorized representative who has indicated his/her understanding and acceptance.       Plan Discussed with: CRNA, Anesthesiologist and Surgeon  Anesthesia Plan Comments:         Anesthesia Quick Evaluation

## 2019-03-01 NOTE — Anesthesia Procedure Notes (Signed)

## 2019-03-01 NOTE — H&P (Signed)
Vernon Fowler    Chief Complaint: right shoulder impingement, acromioclavicular osteoarthritis, rotator cuff repair HPI: The patient is a 63 y.o. male with chronic and progressively increasing right shoulder pain with recent MRI scan confirming a full-thickness rotator cuff tear.  Due to his increasing pain and functional rotations he is brought to the operating room at this time for planned right shoulder arthroscopy  Past Medical History:  Diagnosis Date  . Alcohol abuse   . Anxiety   . GERD (gastroesophageal reflux disease)   . Hypertension   . Sleep apnea    w/CPAP    Past Surgical History:  Procedure Laterality Date  . COLONOSCOPY    . THYROIDECTOMY     1987    Family History  Problem Relation Age of Onset  . Cancer Mother   . Anxiety disorder Mother   . Diabetes Father   . Hyperlipidemia Father   . Hypertension Father   . Cancer Maternal Grandmother   . Cancer Paternal Grandmother   . Heart disease Paternal Grandfather   . Anxiety disorder Daughter     Social History:  reports that he quit smoking about 3 months ago. His smoking use included cigarettes. He quit after 4.00 years of use. He has never used smokeless tobacco. He reports current alcohol use. He reports that he does not use drugs.   Medications Prior to Admission  Medication Sig Dispense Refill  . b complex vitamins tablet Take 1 tablet by mouth daily.    Marland Kitchen buPROPion (WELLBUTRIN XL) 300 MG 24 hr tablet Take 300 mg by mouth daily.    . busPIRone (BUSPAR) 15 MG tablet Take 15 mg by mouth 3 (three) times daily.    . carboxymethylcellulose (REFRESH PLUS) 0.5 % SOLN Place 1 drop into both eyes 2 (two) times daily as needed (dry eyes).     . clonazePAM (KLONOPIN) 0.5 MG tablet Take 0.25-0.5 mg by mouth 2 (two) times daily as needed for anxiety.     . Coenzyme Q10 (COQ10) 100 MG CAPS Take 100 mg by mouth daily.     . diclofenac sodium (VOLTAREN) 1 % GEL Apply 2 g topically 4 (four) times daily.    Marland Kitchen  gabapentin (NEURONTIN) 100 MG capsule Take 100-200 mg by mouth 3 (three) times daily.     . irbesartan (AVAPRO) 150 MG tablet Take 150 mg by mouth daily.    Marland Kitchen levothyroxine (SYNTHROID, LEVOTHROID) 200 MCG tablet Take 200 mcg by mouth See admin instructions. Take 1 tablet (200 mcg) by mouth daily, except on Sundays (6 days a week)  0  . milk thistle 175 MG tablet Take 175 mg by mouth daily.    . Multiple Vitamin (MULTIVITAMIN WITH MINERALS) TABS tablet Take 1 tablet by mouth daily. Men's 50+    . Omega-3 Fatty Acids (FISH OIL PO) Take 1 capsule by mouth daily.    . propranolol (INDERAL) 10 MG tablet Take 10 mg by mouth 2 (two) times daily.     . rosuvastatin (CRESTOR) 10 MG tablet Take 10 mg by mouth daily.  0  . thiamine (VITAMIN B-1) 100 MG tablet Take 100 mg by mouth daily.    . traZODone (DESYREL) 50 MG tablet Take 50 mg by mouth at bedtime.     . busPIRone (BUSPAR) 10 MG tablet TAKE ONE TABLET BY MOUTH THREE TIMES A DAY (Patient not taking: Reported on 02/20/2019) 30 tablet 0     Physical Exam: Right shoulder examination demonstrates painful and guarded motion as  noted at his recent office visits.  His recent MRI scan is reviewed confirming a full-thickness rotator cuff tear.  AC joint arthropathy and bony impingement are also noted.  Vitals  Temp:  [98.7 F (37.1 C)] 98.7 F (37.1 C) (07/02 1108) Pulse Rate:  [51] 51 (07/02 1108) Resp:  [14] 14 (07/02 1108) BP: (119)/(81) 119/81 (07/02 1108) SpO2:  [98 %] 98 % (07/02 1108) Weight:  [98.1 kg] 98.1 kg (07/02 1100)  Assessment/Plan  Impression: right shoulder impingement, acromioclavicular osteoarthritis, rotator cuff repair  Plan of Action: Procedure(s): Right shoulder arthroscopy, subacromial decompression, distal clavicle resection, rotator cuff repair  Vernon Fowler M Vernon Fowler 03/01/2019, 11:57 AM Contact # [phone]

## 2019-03-06 ENCOUNTER — Encounter (HOSPITAL_COMMUNITY): Payer: Self-pay | Admitting: Orthopedic Surgery

## 2019-03-12 DIAGNOSIS — M25511 Pain in right shoulder: Secondary | ICD-10-CM | POA: Insufficient documentation

## 2019-06-19 DIAGNOSIS — N451 Epididymitis: Secondary | ICD-10-CM | POA: Insufficient documentation

## 2019-07-24 ENCOUNTER — Telehealth: Payer: Self-pay | Admitting: Pulmonary Disease

## 2019-07-24 NOTE — Telephone Encounter (Signed)
Called and spoke with pt who stated he is needing a new cpap machine without humidifier. Pt said that his machine is making a weird noise and is also blowing but pt said that this is making him short of breath. Pt said he has it on setting of 9cm.  Dr. Halford Chessman, please advise if it is okay to order pt a new cpap machine. Thanks!

## 2019-07-25 NOTE — Telephone Encounter (Signed)
Left message for patient to call back  

## 2019-07-25 NOTE — Telephone Encounter (Signed)
Not seen since August 2019.  Needs ROV before being able to place order.

## 2019-07-30 NOTE — Telephone Encounter (Signed)
Call made to patient, confirmed DOB, made aware he will need a OV. He states he will call back to schedule. I made him aware it can be virtual.   Nothing further needed at this time.

## 2019-08-20 ENCOUNTER — Other Ambulatory Visit: Payer: Self-pay | Admitting: Internal Medicine

## 2019-08-20 DIAGNOSIS — I712 Thoracic aortic aneurysm, without rupture, unspecified: Secondary | ICD-10-CM

## 2019-08-27 ENCOUNTER — Telehealth: Payer: Self-pay | Admitting: Gastroenterology

## 2019-08-27 NOTE — Telephone Encounter (Signed)
I would not expect this to be related to Barrett's, although it may be related to reflux. I would recommend trying to increase the dose of omeprazole to 40 mg twice daily between now and his follow-up appointment to see if that helps. I recommend that he review the possibility for symptoms due to CPAP and allergies with his pulmonologist and PCP. Thanks.

## 2019-08-27 NOTE — Telephone Encounter (Signed)
Spoke to the patient who wanted to report and increased amount of mucus production every morning for about the last 3 weeks. He does not know if it is related to his c-pap use, allergies or Barrett's. He experiences this only in the morning and is able to expel it when he wakes up. He states he knows it's "more than normal" and clear. He wanted f/u scheduled (10/04/19 at 11:10 am).

## 2019-08-28 ENCOUNTER — Other Ambulatory Visit: Payer: Self-pay | Admitting: *Deleted

## 2019-08-28 MED ORDER — OMEPRAZOLE 40 MG PO CPDR: 40.0000 mg | DELAYED_RELEASE_CAPSULE | Freq: Two times a day (BID) | ORAL | 2 refills | Status: DC

## 2019-08-28 NOTE — Telephone Encounter (Signed)
Script for omeprazole 40 mg BID sent to pharmacy on file. Patient recommended to f/u with a pulmonologist concerning continued symptoms.

## 2019-09-05 ENCOUNTER — Ambulatory Visit
Admission: RE | Admit: 2019-09-05 | Discharge: 2019-09-05 | Disposition: A | Discharge status: Home / Self Care | Payer: BC Managed Care – PPO | Source: Ambulatory Visit | Attending: Internal Medicine | Admitting: Internal Medicine

## 2019-09-05 ENCOUNTER — Other Ambulatory Visit: Payer: Self-pay | Admitting: Internal Medicine

## 2019-09-05 DIAGNOSIS — I712 Thoracic aortic aneurysm, without rupture, unspecified: Secondary | ICD-10-CM

## 2019-09-06 ENCOUNTER — Encounter: Payer: Self-pay | Admitting: Podiatry

## 2019-09-06 ENCOUNTER — Ambulatory Visit (INDEPENDENT_AMBULATORY_CARE_PROVIDER_SITE_OTHER): Payer: BC Managed Care – PPO

## 2019-09-06 ENCOUNTER — Ambulatory Visit: Payer: BC Managed Care – PPO | Admitting: Podiatry

## 2019-09-06 ENCOUNTER — Other Ambulatory Visit: Payer: Self-pay

## 2019-09-06 DIAGNOSIS — M7662 Achilles tendinitis, left leg: Secondary | ICD-10-CM | POA: Diagnosis not present

## 2019-09-06 DIAGNOSIS — M7661 Achilles tendinitis, right leg: Secondary | ICD-10-CM | POA: Diagnosis not present

## 2019-09-06 MED ORDER — NITROGLYCERIN 0.1 MG/HR TD PT24: 0.2000 mg | MEDICATED_PATCH | Freq: Every day | TRANSDERMAL | Status: DC

## 2019-09-07 NOTE — Progress Notes (Signed)
Subjective:   Patient ID: Vernon Fowler, male   DOB: 64 y.o.   MRN: [id number]   HPI Patient presents stating that he is had a lot of pain in regard to his Achilles of both feet that is been present for at least 3 months and has not been active because of the pain.  He states the pain is worse when he sleeps and hurts some when he gets up in the day and some during the day but worse at nighttime.  Patient no longer smokes and would like to be more active   Review of Systems  All other systems reviewed and are negative.       Objective:  Physical Exam Vitals and nursing note reviewed.  Constitutional:      Appearance: He is well-developed.  Pulmonary:     Effort: Pulmonary effort is normal.  Musculoskeletal:        General: Normal range of motion.  Skin:    General: Skin is warm.  Neurological:     Mental Status: He is alert.     Neurovascular status intact muscle strength was found to be adequate range of motion within normal limits.  Patient is found to have quite a bit of discomfort in the Achilles tendon bilateral at the muscle tendon junction with some thickening around this area and pain with palpation.  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  At this point I have recommended the utilization of nitro patches along with night splints to try to stretch his feet and discussed the possibility for MRI and possible surgery in the future  X-rays indicate that there is no indications that there is spurring but there is soft tissue density enlargement at the muscle tendon junction

## 2019-09-11 ENCOUNTER — Telehealth: Payer: Self-pay | Admitting: *Deleted

## 2019-09-11 MED ORDER — NITROGLYCERIN 0.4 MG/HR TD PT24: 0.4000 mg | MEDICATED_PATCH | Freq: Every day | TRANSDERMAL | 12 refills | Status: DC

## 2019-09-11 NOTE — Telephone Encounter (Signed)
I informed pt of Dr. Mellody Drown orders to [facility], and apologized for the delay.

## 2019-09-11 NOTE — Telephone Encounter (Signed)
Pt states he ws seen 09/06/2019 and Dr. Paulla Dolly was to prescribe Nitro patches for the pain in his achilles tendons, but they are not at the [facility] on [address] and [address].

## 2019-09-25 ENCOUNTER — Ambulatory Visit
Admission: RE | Admit: 2019-09-25 | Discharge: 2019-09-25 | Disposition: A | Discharge status: Home / Self Care | Payer: BC Managed Care – PPO | Source: Ambulatory Visit | Attending: Internal Medicine | Admitting: Internal Medicine

## 2019-09-25 DIAGNOSIS — I712 Thoracic aortic aneurysm, without rupture, unspecified: Secondary | ICD-10-CM

## 2019-09-25 MED ORDER — IOPAMIDOL (ISOVUE-370) INJECTION 76%
75.0000 mL | Freq: Once | INTRAVENOUS | Status: AC | PRN
  Administered 2019-09-25: 75 mL via INTRAVENOUS

## 2019-09-26 DIAGNOSIS — I2510 Atherosclerotic heart disease of native coronary artery without angina pectoris: Secondary | ICD-10-CM | POA: Insufficient documentation

## 2019-10-01 HISTORY — PX: FASCIOTOMY: SHX132

## 2019-10-04 ENCOUNTER — Ambulatory Visit: Payer: BC Managed Care – PPO | Admitting: Gastroenterology

## 2019-10-11 ENCOUNTER — Ambulatory Visit: Payer: BC Managed Care – PPO | Admitting: Podiatry

## 2019-10-16 ENCOUNTER — Telehealth: Payer: Self-pay | Admitting: Emergency Medicine

## 2019-10-16 NOTE — Telephone Encounter (Signed)
Spoke with Lattie Haw in Medical Records at Dr. Lacey Jensen she states they need a signed consent from patient before releasing any medical records. Fax consent to [phone]. I explained to her that patient has only been seen virtually and this was for continuity of care but she states their policy is they need a signed release. Will have patient sign release at upcoming appointment.

## 2019-10-18 ENCOUNTER — Ambulatory Visit: Payer: BC Managed Care – PPO | Admitting: Gastroenterology

## 2019-10-18 ENCOUNTER — Other Ambulatory Visit: Payer: Self-pay

## 2019-10-18 ENCOUNTER — Encounter: Payer: Self-pay | Admitting: Gastroenterology

## 2019-10-18 ENCOUNTER — Ambulatory Visit (INDEPENDENT_AMBULATORY_CARE_PROVIDER_SITE_OTHER): Payer: BC Managed Care – PPO | Admitting: Gastroenterology

## 2019-10-18 DIAGNOSIS — R0989 Other specified symptoms and signs involving the circulatory and respiratory systems: Secondary | ICD-10-CM

## 2019-10-18 DIAGNOSIS — K22719 Barrett's esophagus with dysplasia, unspecified: Secondary | ICD-10-CM

## 2019-10-18 DIAGNOSIS — K760 Fatty (change of) liver, not elsewhere classified: Secondary | ICD-10-CM

## 2019-10-18 DIAGNOSIS — Z86010 Personal history of colonic polyps: Secondary | ICD-10-CM

## 2019-10-18 NOTE — Patient Instructions (Addendum)
I have recommended a proton pump inhibitor, PPI, for treatment. PPIs work by inactivating a specific enzyme responsible for the final step of acid release in the stomach. PPIs effectively heal esophagitis and alleviate heartburn. They may also prevent progression of Barrett's Esophagus.   In general, PPIs are considered safe medicines. But they may make the gastrointestinal tract more susceptible to bacterial infections-including a serious diarrheal disease called Clostridium difficile-and may increase the long-term risk of hip fractures. Despite these concerns, PPIs are the preferred medication for treating esophagitis and severe cases of reflux.  I recommend that you start taking calcium carbonate 1200 mg daily with vitamin D 800 IU daily to protect your bones.  I have recommended an EGD to follow-up on your Barrett's. We will be able to discuss the best dose of PPI after those results are available.

## 2019-10-18 NOTE — Progress Notes (Signed)
TELEHEALTH VISIT  Referring Provider: Shon Baton, MD Primary Care Physician:  Shon Baton, MD   Tele-visit due to COVID-19 pandemic Patient requested visit virtually, consented to the virtual encounter via video enabled telemedicine application (Dox[employer]ity, had to use audio because of limited WiFi availability given the current ice storm) Contact made at: 10/18/19 10:10 Patient verified by name and date of birth Location of patient: Home Location provider: Home Names of persons participating: Me, patient, Tinnie Gens CMA Time spent on telehealth call: 29 minutes I discussed the limitations of evaluation and management by telemedicine. The patient expressed understanding and agreed to proceed.  Chief complaint:  Increased mucous   IMPRESSION:  Early-morning phlegm concerns Barrett's esophagus on EGD in [place] 2-3 years ago GERD controlled on daily PPI therapy ? 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 Virgina Jock is monitoring him now No known family history of colon polyps or cancer  Concerns about morning phlegm: I do not have an explanation for his concerns about phlegm. It does not sound like globus. I do not think it's related to his Barrett's. I recommend that he review this concern with Dr. Virgina Jock, Dr. Halford Chessman, or his ENT.   Diagnosis of Barrett's esophagus. I have been unable to obtain his prior endoscopy report.  EGD recommended for biopsies.  Continue PPI twice daily.  We discussed: the need for calcium with vit. D supplements because of possible interference with absorption; potential increased risk of bone fractures, GI infections including C Diff, cardiac related disease, potential of vitamin D, B12 and magnesium malabsorption that requires chronic monitoring; potential for causing osteoporosis and the need for monitoring.  We discussed the potential risks of use of these medicines with the benefit of their use. We discussed the option of discontinuation.  There obviously is a risk in both taking the medicines as well as stopping the medicines given his Barrett's. All questions were answered to the best of my ability. Encouraged him to continue to abstain from alcohol.  History of polyps: Colonoscopy from 2007 showed that the polyp removed was not an adenoma. Will work to obtain a copy of his more recent colonoscopy report. He will stop by the office and sign a release. There is no known family history of colon cancer or polyps.  NASH+/- ASH with associated iron overload: He has not followed up at [facility] since 2019. He feels that Dr. Virgina Jock is now monitoring his liver disease and that he does not need to return. Will obtain recent labs from Dr. Virgina Jock. Consider FibroSURE or elastography to restage his liver disease.    PLAN: - Obtain records from Dr. Lacey Jensen at [facility] (EGD report, pathology results, and GI office notes)  - Continue omeprazole 40 mg BID until the time of the endoscopy - EGD with Barrett's biopsies. -  Abstinence from alcohol encouraged with ongoing formal alcohol counseling - Obtain recent labs from Dr. Virgina Jock - Refer to Dr. Halford Chessman to address concerns about morning phlegm  I consented the patient discussing the risks, benefits, and alternatives to endoscopic evaluation. In particular, we discussed the risks that include, but are not limited to, reaction to medication,  cardiopulmonary compromise, bleeding requiring blood transfusion, aspiration resulting in pneumonia, perforation requiring surgery, lack of diagnosis, severe illness requiring hospitalization, and even death. We reviewed the risk of missed lesion including polyps or even cancer. The patient acknowledges these risks and asks that we proceed.  I spent 50 minutes of time, including in depth chart review of records in [facility] and records obtained today from [facility], communicating results with the patient directly, face-to-face time with the patient, coordinating care, ordering studies and medications as appropriate, and documentation.  HPI: Vernon Fowler is a 64 y.o. [employer]. Last seen as a telehealth visit 12/19/18 to establish care for Barrett's Esophagus after he moved back to [place] last year. The interval history is obtained through the patient and review of his electronic health record. He called in December with concerns for increased mucous/phlegm production that was worse in the morning. It improves after he hacks it up.  I increased his omeprazole to 40 mg BID and this appointment was arranged. I also recommended that he follow-up with his pulmonologist and PCP due to questions about CPAP.     Previously followed by a gastroenterologist - Dr. Lacey Jensen at [facility]. Duodenal ulcer diagnosed in college. Lifetime history of reflux well controlled on PPI for reflux for years, breakthrough symptoms if he misses 2 or 3 consecutive doses. Last endoscopy 3-4 years ago at [facility] showed Barrett's esophagus (patient report). This was the first time Barrett's was seen. No follow-up EGD has been performed. A nutritionist in an online class told him that Barrett's always leads to cancer and this raised his concerns.   He has been followed at [facility] for alcohol-related liver disease followed by Dr.  Damita Lack.  He was last seen by Griffith Citron, PA 10/27/2017.  He was initially seen in consultation in 2017.  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  Ferritin was elevated at 657 and transferrin saturation was 77%.  Serologic evaluation to exclude alpha-1 antitrypsin, Wilson's disease, and viral hepatitis was negative.  He underwent liver biopsy 01/08/2016 that showed steatosis without steatohepatitis.  3+ iron staining was noted within hepatocytes but with a peri-portal focus.  Periportal fibrosis was present but there was no bridging or centrilobular fibrosis.  He had 4 phlebotomy treatments with the removal of 756 mL each time.   He has no history of hepatic decompensation.  He has no history of jaundice, scleral icterus, ascites, or encephalopathy.  No history of GI bleeding.  He reports a colonoscopy at age 18 and age 25 in [place]. I reviewed records from 2007 showing a colonoscopy with Dr. Bonnita Nasuti. The "polyp" that was removed from the sigmoid was inflamed colonic mucosa. The patient reports a follow-up EGD and colonoscopy at that time.  I have been unable to obtain those results.   Since his last visit with me, he was evaluated by Dr. Redmond Baseman at [facility] 09/03/19 to consider alternatives to CPAP. The records from that visit do not report the complaints of increased mucous.    On the higher dose of omeprazole, he feels that  things have improved. But, he is concerned about the taking long term PPI therapy. He is concerned about the risks of Barrett's and getting esophageal cancer. No heartburn, brash, dysphonia, dysphagia, odynophagia, nausea, abdominal pain, blood in the stool.   Last seen by Dr. Virgina Jock 6 months ago. Follow-up with him in April. He had labs performed 3 weeks ago. He understands that his triglycerides are elevated. He is not drinking any alcohol.  Notes improvement in liver enzymes and hypertension since he quit drinking.     Past Medical History:   Diagnosis Date  . Alcohol abuse   . Anxiety   . GERD (gastroesophageal reflux disease)   . Hypertension   . Sleep apnea    w/CPAP    Past Surgical History:  Procedure Laterality Date  . COLONOSCOPY    . SHOULDER ARTHROSCOPY WITH ROTATOR CUFF REPAIR Right 03/01/2019   Procedure: Right shoulder arthroscopy, subacromial decompression, distal clavicle resection, rotator cuff repair;  Surgeon: Justice Britain, MD;  Location: [facility];  Service: Orthopedics;  Laterality: Right;  . THYROIDECTOMY     1987    Current Outpatient Medications  Medication Sig Dispense Refill  . b complex vitamins tablet Take 1 tablet by mouth daily.    Marland Kitchen buPROPion (WELLBUTRIN XL) 300 MG 24 hr tablet Take 300 mg by mouth daily.    . carboxymethylcellulose (REFRESH PLUS) 0.5 % SOLN Place 1 drop into both eyes 2 (two) times daily as needed (dry eyes).     . clonazePAM (KLONOPIN) 0.5 MG tablet Take 0.25-0.5 mg by mouth 2 (two) times daily as needed for anxiety.     . Coenzyme Q10 (COQ10) 100 MG CAPS Take 100 mg by mouth daily.     . diclofenac sodium (VOLTAREN) 1 % GEL Apply 2 g topically 4 (four) times daily.    Marland Kitchen gabapentin (NEURONTIN) 100 MG capsule Take 100-200 mg by mouth 3 (three) times daily.     . irbesartan (AVAPRO) 150 MG tablet Take 150 mg by mouth daily.    Marland Kitchen levothyroxine (SYNTHROID, LEVOTHROID) 200 MCG tablet Take 200 mcg by mouth See admin instructions. Take 1 tablet (200 mcg) by mouth daily, except on Sundays (6 days a week)  0  . milk thistle 175 MG tablet Take 175 mg by mouth daily.    . Multiple Vitamin (MULTIVITAMIN WITH MINERALS) TABS tablet Take 1 tablet by mouth daily. Men's 50+    . naproxen (NAPROSYN) 500 MG tablet Take 1 tablet (500 mg total) by mouth 2 (two) times daily with a meal. 60 tablet 1  . Omega-3 Fatty Acids (FISH OIL PO) Take 1 capsule by mouth daily.    Marland Kitchen omeprazole (PRILOSEC) 40 MG capsule Take 1 capsule (40 mg total) by mouth 2 (two) times daily. 60 capsule 2  . propranolol  (INDERAL) 10 MG tablet Take 10 mg by mouth 2 (two) times daily.     . rosuvastatin (CRESTOR) 10 MG tablet Take 10 mg by mouth daily.  0  . thiamine (VITAMIN B-1) 100 MG tablet Take 100 mg by mouth daily.    . traMADol (ULTRAM) 50 MG tablet Take 50 mg by mouth as needed.    . traZODone (DESYREL) 50 MG tablet Take 50 mg by mouth at bedtime.      No current facility-administered medications for this visit.    Allergies as of 10/18/2019 - Review Complete 09/06/2019  Allergen Reaction Noted  . Sulfa antibiotics Rash 03/31/2011    Family History  Problem Relation Age  of Onset  . Cancer Mother   . Anxiety disorder Mother   . Diabetes Father   . Hyperlipidemia Father   . Hypertension Father   . Cancer Maternal Grandmother   . Cancer Paternal Grandmother   . Heart disease Paternal Grandfather   . Anxiety disorder Daughter     Social History   Socioeconomic History  . Marital status: Single    Spouse name: Not on file  . Number of children: Not on file  . Years of education: Not on file  . Highest education level: Not on file  Occupational History  . Not on file  Tobacco Use  . Smoking status: Former Smoker    Years: 4.00    Types: Cigarettes    Quit date: 11/17/2018    Years since quitting: 0.9  . Smokeless tobacco: Never Used  . Tobacco comment: 4 per day  Substance and Sexual Activity  . Alcohol use: Yes    Comment: recovery ETOH - last drink 2/5  . Drug use: No  . Sexual activity: Not on file  Other Topics Concern  . Not on file  Social History Narrative   Works at [employer] to [employer] recently - currently separated   2 daughters - [place] and [place]   Social Determinants of Health   Financial Resource Strain:   . Difficulty of Paying Living Expenses: Not on file  Food Insecurity:   . Worried About Charity fundraiser in the Last Year: Not on file  . Ran Out of Food in the Last Year: Not on file  Transportation Needs:   . Lack of  Transportation (Medical): Not on file  . Lack of Transportation (Non-Medical): Not on file  Physical Activity:   . Days of Exercise per Week: Not on file  . Minutes of Exercise per Session: Not on file  Stress:   . Feeling of Stress : Not on file  Social Connections:   . Frequency of Communication with Friends and Family: Not on file  . Frequency of Social Gatherings with Friends and Family: Not on file  . Attends Religious Services: Not on file  . Active Member of Clubs or Organizations: Not on file  . Attends Archivist Meetings: Not on file  . Marital Status: Not on file  Intimate Partner Violence:   . Fear of Current or Ex-Partner: Not on file  . Emotionally Abused: Not on file  . Physically Abused: Not on file  . Sexually Abused: Not on file     Physical Exam: General: in no acute distress Neuro: Alert and appropriate Psych: Normal affect and normal insight   Harbor Paster L. Tarri Glenn, MD, MPH [facility] 10/18/2019, 9:46 AM

## 2019-10-19 NOTE — Addendum Note (Signed)
Addended by: Wyline Beady on: 10/19/2019 12:15 PM   Modules accepted: Orders

## 2019-10-23 ENCOUNTER — Telehealth: Payer: Self-pay | Admitting: Gastroenterology

## 2019-10-23 DIAGNOSIS — K22719 Barrett's esophagus with dysplasia, unspecified: Secondary | ICD-10-CM

## 2019-10-23 DIAGNOSIS — R0989 Other specified symptoms and signs involving the circulatory and respiratory systems: Secondary | ICD-10-CM

## 2019-10-23 NOTE — Telephone Encounter (Signed)
Pt stated that he returned your call regarding an appointment.

## 2019-10-23 NOTE — Telephone Encounter (Signed)
Spoke with patient and scheduled him for endoscopy on 11-05-19 at 10 am. Covid test will be on 11-01-19. He has mychart and will view his instructions there, went over instructions verbally.

## 2019-11-05 ENCOUNTER — Encounter: Payer: BC Managed Care – PPO | Admitting: Gastroenterology

## 2019-12-10 ENCOUNTER — Other Ambulatory Visit: Payer: Self-pay | Admitting: Gastroenterology

## 2019-12-10 ENCOUNTER — Ambulatory Visit (INDEPENDENT_AMBULATORY_CARE_PROVIDER_SITE_OTHER): Payer: 59

## 2019-12-10 ENCOUNTER — Other Ambulatory Visit: Payer: Self-pay

## 2019-12-10 DIAGNOSIS — Z1159 Encounter for screening for other viral diseases: Secondary | ICD-10-CM

## 2019-12-11 LAB — SARS CORONAVIRUS 2 (TAT 6-24 HRS): SARS Coronavirus 2: NEGATIVE

## 2019-12-12 ENCOUNTER — Encounter: Payer: Self-pay | Admitting: Gastroenterology

## 2019-12-12 ENCOUNTER — Other Ambulatory Visit: Payer: Self-pay

## 2019-12-12 ENCOUNTER — Ambulatory Visit (AMBULATORY_SURGERY_CENTER): Payer: 59 | Admitting: Gastroenterology

## 2019-12-12 VITALS — BP 110/69 | HR 44 | Temp 97.1°F | Resp 15 | Ht 74.0 in | Wt 216.0 lb

## 2019-12-12 DIAGNOSIS — K22719 Barrett's esophagus with dysplasia, unspecified: Secondary | ICD-10-CM

## 2019-12-12 DIAGNOSIS — K449 Diaphragmatic hernia without obstruction or gangrene: Secondary | ICD-10-CM | POA: Diagnosis not present

## 2019-12-12 DIAGNOSIS — K219 Gastro-esophageal reflux disease without esophagitis: Secondary | ICD-10-CM

## 2019-12-12 DIAGNOSIS — K2950 Unspecified chronic gastritis without bleeding: Secondary | ICD-10-CM | POA: Diagnosis not present

## 2019-12-12 DIAGNOSIS — K3189 Other diseases of stomach and duodenum: Secondary | ICD-10-CM | POA: Diagnosis not present

## 2019-12-12 DIAGNOSIS — K259 Gastric ulcer, unspecified as acute or chronic, without hemorrhage or perforation: Secondary | ICD-10-CM

## 2019-12-12 MED ORDER — SODIUM CHLORIDE 0.9 % IV SOLN: 500.0000 mL | Freq: Once | INTRAVENOUS | Status: DC

## 2019-12-12 NOTE — Patient Instructions (Signed)
Handouts given for hiatal hernia and peptic ulcer disease.  Continue taking all of your medications.    Avoid NSAIDS (Aspirin, Ibuprofen, Aleve, Naproxen), you may use Tylenol as needed  YOU HAD AN ENDOSCOPIC PROCEDURE TODAY AT [place] [facility]:   Refer to the procedure report that was given to you for any specific questions about what was found during the examination.  If the procedure report does not answer your questions, please call your gastroenterologist to clarify.  If you requested that your care partner not be given the details of your procedure findings, then the procedure report has been included in a sealed envelope for you to review at your convenience later.  YOU SHOULD EXPECT: Some feelings of bloating in the abdomen. Passage of more gas than usual.  Walking can help get rid of the air that was put into your GI tract during the procedure and reduce the bloating. If you had a lower endoscopy (such as a colonoscopy or flexible sigmoidoscopy) you may notice spotting of blood in your stool or on the toilet paper. If you underwent a bowel prep for your procedure, you may not have a normal bowel movement for a few days.  Please Note:  You might notice some irritation and congestion in your nose or some drainage.  This is from the oxygen used during your procedure.  There is no need for concern and it should clear up in a day or so.  SYMPTOMS TO REPORT IMMEDIATELY:   Following upper endoscopy (EGD)  Vomiting of blood or coffee ground material  New chest pain or pain under the shoulder blades  Painful or persistently difficult swallowing  New shortness of breath  Fever of 100F or higher  Black, tarry-looking stools  For urgent or emergent issues, a gastroenterologist can be reached at any hour by calling [phone]. Do not use MyChart messaging for urgent concerns.    DIET:  We do recommend a small meal at first, but then you may proceed to your regular diet.  Drink  plenty of fluids but you should avoid alcoholic beverages for 24 hours.  ACTIVITY:  You should plan to take it easy for the rest of today and you should NOT DRIVE or use heavy machinery until tomorrow (because of the sedation medicines used during the test).    FOLLOW UP: Our staff will call the number listed on your records 48-72 hours following your procedure to check on you and address any questions or concerns that you may have regarding the information given to you following your procedure. If we do not reach you, we will leave a message.  We will attempt to reach you two times.  During this call, we will ask if you have developed any symptoms of COVID 19. If you develop any symptoms (ie: fever, flu-like symptoms, shortness of breath, cough etc.) before then, please call [phone].  If you test positive for Covid 19 in the 2 weeks post procedure, please call and report this information to [place].    If any biopsies were taken you will be contacted by phone or by letter within the next 1-3 weeks.  Please call us at [phone] if you have not heard about the biopsies in 3 weeks.    SIGNATURES/CONFIDENTIALITY: You and/or your care partner have signed paperwork which will be entered into your electronic medical record.  These signatures attest to the fact that that the information above on your After Visit Summary has been reviewed and  is understood.  Full responsibility of the confidentiality of this discharge information lies with you and/or your care-partner.

## 2019-12-12 NOTE — Progress Notes (Signed)
Called to room to assist during endoscopic procedure.  Patient ID and intended procedure confirmed with present staff. Received instructions for my participation in the procedure from the performing physician.  

## 2019-12-12 NOTE — Op Note (Signed)
[facility] Patient Name: Vernon Fowler Procedure Date: 12/12/2019 9:26 AM MRN: [id number] Endoscopist: Thornton Park MD, MD Age: 64 Referring MD:  Date of Birth: 1956/06/25 Gender: Male Account #: [id number] Procedure:                Upper GI endoscopy Indications:              Follow-up of Barrett's esophagus                           Early-morning phlegm concerns                           Barrett's esophagus on EGD in [place] 2-3 years                            ago                           GERD controlled on daily PPI therapyEarly- Medicines:                Monitored Anesthesia Care Procedure:                Pre-Anesthesia Assessment:                           - Prior to the procedure, a History and Physical                            was performed, and patient medications and                            allergies were reviewed. The patient's tolerance of                            previous anesthesia was also reviewed. The risks                            and benefits of the procedure and the sedation                            options and risks were discussed with the patient.                            All questions were answered, and informed consent                            was obtained. Prior Anticoagulants: The patient has                            taken no previous anticoagulant or antiplatelet                            agents. ASA Grade Assessment: II - A patient with  mild systemic disease. After reviewing the risks                            and benefits, the patient was deemed in                            satisfactory condition to undergo the procedure.                           After obtaining informed consent, the endoscope was                            passed under direct vision. Throughout the                            procedure, the patient's blood pressure, pulse, and                            oxygen  saturations were monitored continuously. The                            Endoscope was introduced through the mouth, and                            advanced to the third part of duodenum. The upper                            GI endoscopy was accomplished without difficulty.                            The patient tolerated the procedure well. Scope In: Scope Out: Findings:                 The Z-line was irregular and was found 39 cm from                            the incisors. There was a small punctate island.                            Biopsies were taken of the distal esophagus                            including the [place] with a cold forceps for                            histology. Estimated blood loss was minimal.                           Two non-bleeding superficial gastric ulcers with no                            stigmata of bleeding were found in the gastric  antrum. The largest lesion was 6 mm in largest                            dimension. Biopsies were taken with a cold forceps                            for histology. Estimated blood loss was minimal.                           Multiple erosions with no bleeding and no stigmata                            of recent bleeding were found in the gastric body                            and in the gastric antrum. Biopsies were taken from                            the antrum, body, and fundus with a cold forceps                            for histology. Estimated blood loss was minimal.                           A small hiatal hernia was present.                           The examined duodenum was normal.                           The cardia and gastric fundus were normal on                            retroflexion.                           The exam was otherwise without abnormality. Complications:            No immediate complications. Estimated blood loss:                            Minimal. Estimated  Blood Loss:     Estimated blood loss was minimal. Impression:               - Z-line irregular, 39 cm from the incisors.                            Biopsied.                           - Non-bleeding gastric ulcers with no stigmata of                            bleeding. Biopsied.                           -  Erosive gastropathy with no bleeding and no                            stigmata of recent bleeding. Biopsied.                           - Small hiatal hernia.                           - Normal examined duodenum.                           - The examination was otherwise normal. Recommendation:           - Patient has a contact number available for                            emergencies. The signs and symptoms of potential                            delayed complications were discussed with the                            patient. Return to normal activities tomorrow.                            Written discharge instructions were provided to the                            patient.                           - Resume previous diet.                           - Continue present medications including omeprazole                            40 mg BID.                           - No aspirin, ibuprofen, naproxen, or other                            non-steroidal anti-inflammatory drugs.                           - Await pathology results.                           - Return to my office to review these results. Thornton Park MD, MD 12/12/2019 10:41:51 AM This report has been signed electronically.

## 2019-12-12 NOTE — Progress Notes (Signed)
PT taken to PACU. Monitors in place. VSS. Report given to RN. 

## 2019-12-12 NOTE — Progress Notes (Signed)
LC temp DT vitals

## 2019-12-14 ENCOUNTER — Telehealth: Payer: Self-pay | Admitting: *Deleted

## 2019-12-14 NOTE — Telephone Encounter (Signed)
  Follow up Call-  Call back number 12/12/2019  Post procedure Call Back phone  # [phone]  Permission to leave phone message Yes     Patient questions:  Do you have a fever, pain , or abdominal swelling? No. Pain Score  0 *  Have you tolerated food without any problems? Yes.    Have you been able to return to your normal activities? Yes.    Do you have any questions about your discharge instructions: Diet   No. Medications  No. Follow up visit  No.  Do you have questions or concerns about your Care? No.  Actions: * If pain score is 4 or above: No action needed, pain <4.  1. Have you developed a fever since your procedure? no  2.   Have you had an respiratory symptoms (SOB or cough) since your procedure? no  3.   Have you tested positive for COVID 19 since your procedure no  4.   Have you had any family members/close contacts diagnosed with the COVID 19 since your procedure?  no   If yes to any of these questions please route to Joylene John, RN and Erenest Rasher, RN

## 2019-12-14 NOTE — Telephone Encounter (Signed)
No answer for post procedure call back. Left message for patient and will call back later this afternoon.

## 2019-12-18 ENCOUNTER — Encounter: Payer: Self-pay | Admitting: Gastroenterology

## 2019-12-21 ENCOUNTER — Telehealth: Payer: Self-pay | Admitting: Gastroenterology

## 2019-12-21 NOTE — Telephone Encounter (Signed)
Spoke with pt and reviewed path letter with him over the phone. States he will try an obtain his records again.

## 2019-12-21 NOTE — Telephone Encounter (Signed)
Pt calling inquiring about path results.  

## 2019-12-24 NOTE — Telephone Encounter (Signed)
Called back to check if we received EGD done by Dr Lacey Jensen at [facility] about 2 yrs ago. Tells me Dr Tarri Glenn really wanted to see it and provided the fax number [phone] so we can re-submit the release of information he completed if they have not been sent yet.

## 2019-12-25 NOTE — Telephone Encounter (Signed)
The EGD has not been received. Thank you for working to obtain it. Vernon Fowler did not have success in the past.

## 2020-01-07 ENCOUNTER — Ambulatory Visit (INDEPENDENT_AMBULATORY_CARE_PROVIDER_SITE_OTHER): Payer: 59

## 2020-01-07 ENCOUNTER — Ambulatory Visit (INDEPENDENT_AMBULATORY_CARE_PROVIDER_SITE_OTHER): Payer: 59 | Admitting: Orthopaedic Surgery

## 2020-01-07 ENCOUNTER — Other Ambulatory Visit: Payer: Self-pay

## 2020-01-07 ENCOUNTER — Encounter: Payer: Self-pay | Admitting: Orthopaedic Surgery

## 2020-01-07 VITALS — BP 136/86 | HR 60 | Ht 74.0 in | Wt 210.0 lb

## 2020-01-07 DIAGNOSIS — M25562 Pain in left knee: Secondary | ICD-10-CM

## 2020-01-07 DIAGNOSIS — G8929 Other chronic pain: Secondary | ICD-10-CM

## 2020-01-07 NOTE — Progress Notes (Signed)
Office Visit Note   Patient: Vernon Fowler           Date of Birth: 11/10/1955           MRN: [id number] Visit Date: 01/07/2020              Requested by: Shon Baton, [place],  [place] [address] PCP: Shon Baton, MD   Assessment & Plan: Visit Diagnoses:  1. Chronic pain of left knee     Plan: Discussed with patient quad strengthening exercises and knee friendly exercises.  Currently is not having significant pain such that he would want it injection next it would be cortisone injection in the may possibly benefit from supplemental injection in the future.  Questions were encouraged and answered by Dr. Ninfa Linden myself.  Follow-Up Instructions: Return if symptoms worsen or fail to improve.   Orders:  Orders Placed This Encounter  Procedures  . XR Knee 1-2 Views Left   No orders of the defined types were placed in this encounter.     Procedures: No procedures performed   Clinical Data: No additional findings.   Subjective: Chief Complaint  Patient presents with  . Left Knee - Pain    HPI Vernon Fowler is a 64 year old male were seen for the first time.  Comes in with left knee pain has been ongoing for some time.  He reports 6 years ago in had an injury that resulted in tib-fib fracture sounds as if this was allowed to heal without any surgical intervention.  Also history of left knee arthroscopy 8 years ago.  He denies any mechanical symptoms left knee swelling.  Pain mostly with going up stairs.  Review of Systems Negative for fevers chills shortness of breath chest pain  Objective: Vital Signs: BP 136/86   Pulse 60   Ht 6\' 2"  (1.88 m)   Wt 210 lb (95.3 kg)   BMI 26.96 kg/m   Physical Exam General: Well-developed well-nourished male no acute distress mood and affect appropriate. Psych: Alert and oriented x3. Ortho Exam Left knee no abnormal warmth erythema or effusion.  Negative McMurray's.  No instability valgus varus stressing of  either knee.  Good range of motion of both knees.  Crepitus patellofemoral region left knee with passive range of motion. Specialty Comments:  No specialty comments available.  Imaging: XR Knee 1-2 Views Left  Result Date: 01/07/2020 Left knee AP lateral views: No acute fracture.  Medial lateral joint lines well-maintained.  Mild to moderate patellofemoral changes.  No bony abnormalities otherwise.  No subluxation dislocation left knee.    PMFS History: Patient Active Problem List   Diagnosis Date Noted  . Alcohol use disorder, severe, dependence ([place]) 12/09/2017  . Hypertension 10/28/2017  . GERD (gastroesophageal reflux disease) 10/28/2017  . Alcohol abuse 10/28/2017  . GAD (generalized anxiety disorder) 10/28/2017  . PUD (peptic ulcer disease) 10/01/2011  . Thyroid cancer ([place]) 03/31/2011   Past Medical History:  Diagnosis Date  . Alcohol abuse   . Anxiety   . GERD (gastroesophageal reflux disease)   . Hypertension   . Sleep apnea    w/CPAP    Family History  Problem Relation Age of Onset  . Cancer Mother   . Anxiety disorder Mother   . Diabetes Father   . Hyperlipidemia Father   . Hypertension Father   . Cancer Maternal Grandmother   . Cancer Paternal Grandmother   . Heart disease Paternal Grandfather   . Anxiety disorder Daughter   .  Colon cancer Neg Hx   . Esophageal cancer Neg Hx   . Stomach cancer Neg Hx   . Rectal cancer Neg Hx     Past Surgical History:  Procedure Laterality Date  . COLONOSCOPY    . FASCIOTOMY Right 10/2019  . SHOULDER ARTHROSCOPY WITH ROTATOR CUFF REPAIR Right 03/01/2019   Procedure: Right shoulder arthroscopy, subacromial decompression, distal clavicle resection, rotator cuff repair;  Surgeon: Justice Britain, MD;  Location: [facility];  Service: Orthopedics;  Laterality: Right;  . THYROIDECTOMY     1987   Social History   Occupational History  . Not on file  Tobacco Use  . Smoking status: Former Smoker    Packs/day: 0.25    Years:  4.00    Pack years: 1.00    Types: Cigarettes    Quit date: 11/17/2018    Years since quitting: 1.1  . Smokeless tobacco: Never Used  . Tobacco comment: 4 per day  Substance and Sexual Activity  . Alcohol use: Not Currently    Comment: recovery ETOH - last drink 2/5  . Drug use: Not Currently  . Sexual activity: Not on file

## 2020-01-08 NOTE — Telephone Encounter (Signed)
Thank you :)

## 2020-01-17 ENCOUNTER — Ambulatory Visit: Payer: 59 | Admitting: Gastroenterology

## 2020-02-22 ENCOUNTER — Ambulatory Visit: Payer: 59 | Admitting: Gastroenterology

## 2020-05-27 ENCOUNTER — Other Ambulatory Visit: Payer: Self-pay | Admitting: Gastroenterology

## 2020-06-04 ENCOUNTER — Telehealth: Payer: Self-pay | Admitting: Gastroenterology

## 2020-06-04 ENCOUNTER — Other Ambulatory Visit: Payer: Self-pay

## 2020-06-04 MED ORDER — OMEPRAZOLE 40 MG PO CPDR: 40.0000 mg | DELAYED_RELEASE_CAPSULE | Freq: Two times a day (BID) | ORAL | 2 refills | Status: DC

## 2020-06-04 NOTE — Telephone Encounter (Signed)
Script called to pharmacy

## 2020-07-17 ENCOUNTER — Ambulatory Visit (INDEPENDENT_AMBULATORY_CARE_PROVIDER_SITE_OTHER): Payer: 59 | Admitting: Gastroenterology

## 2020-07-17 ENCOUNTER — Encounter: Payer: Self-pay | Admitting: Gastroenterology

## 2020-07-17 ENCOUNTER — Telehealth: Payer: Self-pay

## 2020-07-17 VITALS — BP 110/74 | HR 62 | Ht 74.0 in | Wt 216.0 lb

## 2020-07-17 DIAGNOSIS — K760 Fatty (change of) liver, not elsewhere classified: Secondary | ICD-10-CM | POA: Diagnosis not present

## 2020-07-17 DIAGNOSIS — K259 Gastric ulcer, unspecified as acute or chronic, without hemorrhage or perforation: Secondary | ICD-10-CM | POA: Diagnosis not present

## 2020-07-17 DIAGNOSIS — K22719 Barrett's esophagus with dysplasia, unspecified: Secondary | ICD-10-CM | POA: Diagnosis not present

## 2020-07-17 NOTE — Patient Instructions (Addendum)
PRESCRIPTION MEDICATION(S):    Continue to take omeprazole 40 mg twice daily.  OVER THE COUNTER MEDICATION(S): Please purchase the following medications over the counter and take as directed:   Continue to avoid all NSAIDs.  Congratulations on not drinking any alcohol.   Good luck on your efforts to quit smoking. Dr. Virgina Jock may have some additional suggestions for you.   Please call me to schedule an esophagram if you continue to have difficulty with swallowing.  I have recommended an upper endoscopy to follow-up on your stomach ulcers.   ENDOSCOPY: You have been scheduled for a endoscopy. Please follow written instructions given to you at your visit today.   INHALERS: If you use inhalers (even only as needed), please bring them with you on the day of your procedure.  When you have your annual labs with Dr. Virgina Jock, please ask him to obtain a fasting iron and ferritin.  We will try to obtain your records from Dr. Lacey Jensen at [facility]. Given our struggles, you may also want to give them a call to request your Endoscopy Report, Pathology Results and Office Notes. Please ask that they fax these results to [phone].  If you are age 64 or younger, your body mass index should be between 19-25. Your Body mass index is 27.73 kg/m. If this is out of the aformentioned range listed, please consider follow up with your Primary Care Provider.   Thank you for trusting me with your gastrointestinal care!    Thornton Park, MD, MPH

## 2020-07-17 NOTE — Telephone Encounter (Signed)
FAXED DOCUMENTS  Provider: Dr. Lacey Jensen  Document: Signed Medical Records Release Requested records: Endoscopy report, pathology results and OV notes  Above information has been faxed successfully to the Provider's office listed above. Documents and fax confirmation have been placed in the faxed file for future reference. Will await records.

## 2020-07-17 NOTE — Progress Notes (Signed)
Referring Provider: Shon Baton, MD Primary Care Physician:  Shon Baton, MD   Chief complaint:  Dysphagia, globus sensation   IMPRESSION:  Intermittent dysphagia to solids over the last 2-3 weeks Gastric ulcers on EGD 12/12/19    - biopsies negative for H pylori    - no NSAIDs Barrett's esophagus on EGD in [place] 2-3 years ago    - irregular z-line on EGD 12/12/19, biopsies negative for Barrett's GERD controlled on daily PPI therapy ? 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 Virgina Jock is monitoring him now No known family history of colon polyps or cancer  Intermittent dysphagia to solids over the last 2-3 weeks: May be GERD-related dysmotility. Recent biopsies negative for EOE. Plan esophagram +/- e mano if symptoms persist. He may call at any time to schedule.  Gastric ulcers: Without history of NSAIDs or H pylori, recommend repeat EGD to document healing.   Diagnosis of Barrett's esophagus. I have been unable to obtain his prior endoscopy report. EGD recommended for biopsies.  Continue PPI twice daily.  Encouraged him to continue to abstain from alcohol and to quit smoking.  History of polyps: Colonoscopy from 2007 showed that the polyp removed was not an adenoma. Will work to obtain a copy of his more recent colonoscopy report. He will stop by the office and sign a release. There is no known family history of colon  cancer or polyps.  NASH+/- ASH with associated iron overload: He has not followed up at [facility] since 2019. He feels that Dr. Virgina Jock is now monitoring his liver disease and that he does not need to return. He will have ferritin and iron checked with his annual labs later this month. Consider FibroSURE or elastography to restage his liver disease.    PLAN: - Obtain records from Dr. Lacey Jensen at [facility] (EGD report, pathology results, and GI office notes)  - Obtain fasting ferritin level, transferrin saturation, and liver enzymes with upcoming annual labs with Dr. Virgina Jock - Continue omeprazole 40 mg BID  - Avoid all NSAIDs - Esophagram +/- anal  - EGD to follow-up on gastric ulcers - Abstinence from alcohol encouraged with ongoing formal alcohol counseling - Low threshold to pursue elastography to follow-up on hepatic fibrosis - Please quit smoking  HPI: Vernon Fowler returns in scheduled follow-up for multiple GI concerns.  Duodenal ulcer diagnosed in college. Lifetime history of reflux well controlled on PPI for reflux for years, breakthrough symptoms if he misses 2 or 3 consecutive doses. Last endoscopy 4-5 years ago at [facility] with Dr. Lacey Jensen showed Barrett's esophagus (patient report). This was the first time Barrett's was seen. No follow-up EGD had been performed.  We have been unsuccessful in our efforts to get those prior records from Dr. Lacey Jensen.  He was previously  followed at [facility] for alcohol-related liver disease.  He was last seen by Griffith Citron, PA/ Dr. Damita Lack 10/27/2017.  He was initially seen in consultation in 2017.  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  Ferritin was elevated at 657 and transferrin saturation was 77%.  Serologic evaluation to exclude alpha-1 antitrypsin, Wilson's disease, and viral hepatitis were negative.  He underwent liver biopsy 01/08/2016 that showed steatosis without  steatohepatitis.  3+ iron staining was noted within hepatocytes but with a peri-portal focus.  Periportal fibrosis was present but there was no bridging or centrilobular fibrosis.  He had 4 phlebotomy treatments with the removal of 756 mL each time. Ferritin level normalized.  He has no history of hepatic decompensation.  He has no history of jaundice, scleral icterus, ascites, or encephalopathy.  No history of GI bleeding.  He reports a colonoscopy at age 84 and age 4 in [place]. I reviewed records from 2007 showing a colonoscopy with Dr. Bonnita Nasuti. The "polyp" that was removed from the sigmoid was inflamed colonic mucosa.   After his initial consultation with me, an EGD 12/12/19 showed:  - Irregular z-line 39 cm from the incisors with a small punctate island. Biopsies were negative for eosinophilic esophagitis and Barrett's. There were histologic changes of reflux.  - Two non-bleeding superficial gastric ulcers in the gastric antrum. The largest lesion was 6 mm in largest dimension. Biopsies showed focal erosion and inflammation. There was no H pylori. - A small hiatal hernia was present.  Returns today in follow-up reporting 2 to 4 episodes of solid-food dysphagia occurring over the last 2-3 weeks, occasional heartburn when consuming excessive caffeine, fleeting right sided abdominal pain that reminded him of pain that he had related to a duodenal ulcer in college, and bloating.  No odynophagia, globus, dysphonia, neck pain, sore throat, cough regurgitation. Overall feels that his reflux is under good control, although he often forgets to take his second dose of omeprazole.    He is not drinking any alcohol. He is smoking a small amount every day.  Last labs in [place] are from 01/2019. He has annual follow-up with Dr. Virgina Jock later this month.   Past Medical History:  Diagnosis Date  . Alcohol abuse   . Anxiety   . GERD (gastroesophageal reflux disease)   . Hypertension   . Sleep apnea     w/CPAP    Past Surgical History:  Procedure Laterality Date  . COLONOSCOPY    . FASCIOTOMY Right 10/2019  . SHOULDER ARTHROSCOPY WITH ROTATOR CUFF REPAIR Right 03/01/2019   Procedure: Right shoulder arthroscopy, subacromial decompression, distal clavicle resection, rotator cuff repair;  Surgeon: Justice Britain, MD;  Location: [facility];  Service: Orthopedics;  Laterality: Right;  . THYROIDECTOMY     1987    Current Outpatient Medications  Medication Sig Dispense Refill  . b complex vitamins tablet Take 1 tablet by mouth daily.    . betamethasone, augmented, (DIPROLENE) 0.05 % lotion betamethasone, augmented 0.05 % lotion    . buPROPion (WELLBUTRIN XL) 300 MG 24 hr tablet Take 300 mg by mouth daily.    . carboxymethylcellulose (REFRESH PLUS) 0.5 % SOLN Place 1 drop into both eyes 2 (two) times daily as needed (dry eyes).     . cefpodoxime (VANTIN) 200 MG tablet cefpodoxime 200 mg tablet    . clonazePAM (KLONOPIN) 0.5 MG tablet Take 0.25-0.5 mg by mouth 2 (two) times daily as needed for anxiety.     Marland Kitchen  Coenzyme Q10 (COQ10) 100 MG CAPS Take 100 mg by mouth daily.     . diclofenac sodium (VOLTAREN) 1 % GEL Apply 2 g topically 4 (four) times daily.    Marland Kitchen doxycycline (VIBRAMYCIN) 100 MG capsule doxycycline hyclate 100 mg capsule    . gabapentin (NEURONTIN) 100 MG capsule Take 100-200 mg by mouth 3 (three) times daily.     . irbesartan (AVAPRO) 150 MG tablet Take 150 mg by mouth daily.    Marland Kitchen levofloxacin (LEVAQUIN) 500 MG tablet levofloxacin 500 mg tablet    . levothyroxine (SYNTHROID, LEVOTHROID) 200 MCG tablet Take 200 mcg by mouth See admin instructions. Take 1 tablet (200 mcg) by mouth daily, except on Sundays (6 days a week)  0  . meloxicam (MOBIC) 15 MG tablet meloxicam 15 mg tablet    . methocarbamol (ROBAXIN) 500 MG tablet methocarbamol 500 mg tablet    . milk thistle 175 MG tablet Take 175 mg by mouth daily.    . Multiple Vitamin (MULTIVITAMIN WITH MINERALS) TABS tablet Take 1 tablet by mouth  daily. Men's 50+    . naproxen (NAPROSYN) 500 MG tablet Take 1 tablet (500 mg total) by mouth 2 (two) times daily with a meal. 60 tablet 1  . nitroGLYCERIN (NITRODUR - DOSED IN MG/24 HR) 0.4 mg/hr patch nitroglycerin 0.4 mg/hr transdermal 24 hour patch    . Omega-3 Fatty Acids (FISH OIL PO) Take 1 capsule by mouth daily.    Marland Kitchen omeprazole (PRILOSEC) 40 MG capsule Take 1 capsule (40 mg total) by mouth 2 (two) times daily. 60 capsule 2  . ondansetron (ZOFRAN) 8 MG tablet ondansetron HCl 8 mg tablet    . propranolol (INDERAL) 10 MG tablet Take 10 mg by mouth 2 (two) times daily.     . rosuvastatin (CRESTOR) 10 MG tablet Take 10 mg by mouth daily.  0  . thiamine (VITAMIN B-1) 100 MG tablet Take 100 mg by mouth daily.    . traMADol (ULTRAM) 50 MG tablet Take 50 mg by mouth as needed.    . traZODone (DESYREL) 50 MG tablet Take 50 mg by mouth at bedtime.      No current facility-administered medications for this visit.    Allergies as of 07/17/2020 - Review Complete 01/07/2020  Allergen Reaction Noted  . Sulfa antibiotics Rash 03/31/2011    Family History  Problem Relation Age of Onset  . Cancer Mother   . Anxiety disorder Mother   . Diabetes Father   . Hyperlipidemia Father   . Hypertension Father   . Cancer Maternal Grandmother   . Cancer Paternal Grandmother   . Heart disease Paternal Grandfather   . Anxiety disorder Daughter   . Colon cancer Neg Hx   . Esophageal cancer Neg Hx   . Stomach cancer Neg Hx   . Rectal cancer Neg Hx     Social History   Socioeconomic History  . Marital status: Single    Spouse name: Not on file  . Number of children: Not on file  . Years of education: Not on file  . Highest education level: Not on file  Occupational History  . Not on file  Tobacco Use  . Smoking status: Former Smoker    Packs/day: 0.25    Years: 4.00    Pack years: 1.00    Types: Cigarettes    Quit date: 11/17/2018    Years since quitting: 1.6  . Smokeless tobacco: Never  Used  . Tobacco comment: 4 per day  Vaping Use  . Vaping Use: Never used  Substance and Sexual Activity  . Alcohol use: Not Currently    Comment: recovery ETOH - last drink 2/5  . Drug use: Not Currently  . Sexual activity: Not on file  Other Topics Concern  . Not on file  Social History Narrative   Works at [employer] to [employer] recently - currently separated   2 daughters - [place] and [place]   Social Determinants of Health   Financial Resource Strain:   . Difficulty of Paying Living Expenses: Not on file  Food Insecurity:   . Worried About Charity fundraiser in the Last Year: Not on file  . Ran Out of Food in the Last Year: Not on file  Transportation Needs:   . Lack of Transportation (Medical): Not on file  . Lack of Transportation (Non-Medical): Not on file  Physical Activity:   . Days of Exercise per Week: Not on file  . Minutes of Exercise per Session: Not on file  Stress:   . Feeling of Stress : Not on file  Social Connections:   . Frequency of Communication with Friends and Family: Not on file  . Frequency of Social Gatherings with Friends and Family: Not on file  . Attends Religious Services: Not on file  . Active Member of Clubs or Organizations: Not on file  . Attends Archivist Meetings: Not on file  . Marital Status: Not on file  Intimate Partner Violence:   . Fear of Current or Ex-Partner: Not on file  . Emotionally Abused: Not on file  . Physically Abused: Not on file  . Sexually Abused: Not on file     Physical Exam: General:   Alert, in NAD. No scleral icterus. No bilateral temporal wasting.  Heart:  Regular rate and rhythm; no murmurs Pulm: Clear anteriorly; no wheezing Abdomen:  Soft. Nontender. Nondistended. Normal bowel sounds. No rebound or guarding. No fluid wave.  LAD: No inguinal or umbilical LAD Extremities:  Without edema. Neurologic:  Alert and  oriented x4;  grossly normal neurologically; no asterixis or  clonus. Skin: No jaundice. No palmar erythema or spider angioma.  Psych:  Alert and cooperative. Normal mood and affect.  Azyah Flett L. Tarri Glenn, MD, MPH [facility] 07/17/2020, 11:22 AM

## 2020-08-04 NOTE — Telephone Encounter (Signed)
FAXED [place] REQUEST  Provider: Dr. Lacey Jensen  Document: Signed ROI Records requested: Endoscopy and pathology reports, OV notes  Above information has again been faxed successfully to the Provider's office listed above. Documents and fax confirmations have been placed in the faxed file for future reference.

## 2020-08-06 ENCOUNTER — Ambulatory Visit (INDEPENDENT_AMBULATORY_CARE_PROVIDER_SITE_OTHER): Payer: 59 | Admitting: Physician Assistant

## 2020-08-06 ENCOUNTER — Ambulatory Visit (INDEPENDENT_AMBULATORY_CARE_PROVIDER_SITE_OTHER): Payer: 59

## 2020-08-06 ENCOUNTER — Encounter: Payer: Self-pay | Admitting: Physician Assistant

## 2020-08-06 DIAGNOSIS — G8929 Other chronic pain: Secondary | ICD-10-CM | POA: Diagnosis not present

## 2020-08-06 DIAGNOSIS — M25562 Pain in left knee: Secondary | ICD-10-CM | POA: Diagnosis not present

## 2020-08-06 NOTE — Progress Notes (Signed)
Office Visit Note   Patient: Vernon Fowler           Date of Birth: 1956-06-26           MRN: [id number] Visit Date: 08/06/2020              Requested by: Shon Baton, [place],  [place] [id number] PCP: Shon Baton, MD   Assessment & Plan: Visit Diagnoses:  1. Chronic pain of left knee     Plan: Due to the fact that patient had a recent Covid booster 5 days ago would not recommend cortisone injection today.  He is scheduled to go to therapy for other reasons with [facility].  Therefore we will have them work on quad strengthening both knees home exercise program and strengthening both knees.  Follow-Up Instructions: Return if symptoms worsen or fail to improve.   Orders:  Orders Placed This Encounter  Procedures  . XR Knee 1-2 Views Left   No orders of the defined types were placed in this encounter.     Procedures: No procedures performed   Clinical Data: No additional findings.   Subjective: Chief Complaint  Patient presents with  . Left Knee - Pain    HPI Mr. Shenck comes in today for left knee pain.  He states his pain is worse started 3 weeks ago.  We saw him back in May 2021 and at that point discussed quad strengthening.  X-rays at that time showed some mild to moderate patellofemoral changes.  Pain is behind the kneecap and lateral side of the knee.  No known injury.  History of left knee fibular shaft fracture and knee surgery for meniscal tear.  Review of Systems See HPI  Objective: Vital Signs: There were no vitals taken for this visit.  Physical Exam Constitutional:      Appearance: He is not ill-appearing or diaphoretic.  Pulmonary:     Effort: Pulmonary effort is normal.  Neurological:     Mental Status: He is alert and oriented to person, place, and time.  Psychiatric:        Mood and Affect: Mood normal.     Ortho Exam Left knee good range of motion without significant pain.  He is nontender with  palpation about the left knee.  McMurray's is negative.  No instability valgus varus stressing.  No abnormal warmth erythema or effusion left knee.  Passive range of motion does reveal significant patellofemoral crepitus left knee. Specialty Comments:  No specialty comments available.  Imaging: XR Knee 1-2 Views Left  Result Date: 08/06/2020 Left knee 2 views: No acute fracture.  Mild to moderate patellofemoral arthritic changes.  Otherwise knee is well-preserved.  Knee is well located.    PMFS History: Patient Active Problem List   Diagnosis Date Noted  . Pain of right shoulder joint on movement 03/12/2019  . Dupuytren contracture 12/23/2017  . Alcohol use disorder, severe, dependence ([place]) 12/09/2017  . Hypertension 10/28/2017  . GERD (gastroesophageal reflux disease) 10/28/2017  . Alcohol abuse 10/28/2017  . GAD (generalized anxiety disorder) 10/28/2017  . PUD (peptic ulcer disease) 10/01/2011  . Thyroid cancer ([place]) 03/31/2011   Past Medical History:  Diagnosis Date  . Alcohol abuse   . Anxiety   . GERD (gastroesophageal reflux disease)   . Hypertension   . Sleep apnea    w/CPAP    Family History  Problem Relation Age of Onset  . Cancer Mother   . Anxiety disorder Mother   .  Diabetes Father   . Hyperlipidemia Father   . Hypertension Father   . Cancer Maternal Grandmother   . Cancer Paternal Grandmother   . Heart disease Paternal Grandfather   . Anxiety disorder Daughter   . Colon cancer Neg Hx   . Esophageal cancer Neg Hx   . Stomach cancer Neg Hx   . Rectal cancer Neg Hx     Past Surgical History:  Procedure Laterality Date  . COLONOSCOPY    . FASCIOTOMY Right 10/2019  . SHOULDER ARTHROSCOPY WITH ROTATOR CUFF REPAIR Right 03/01/2019   Procedure: Right shoulder arthroscopy, subacromial decompression, distal clavicle resection, rotator cuff repair;  Surgeon: Justice Britain, MD;  Location: [facility];  Service: Orthopedics;  Laterality: Right;  . THYROIDECTOMY      1987   Social History   Occupational History  . Not on file  Tobacco Use  . Smoking status: Former Smoker    Packs/day: 0.25    Years: 4.00    Pack years: 1.00    Types: Cigarettes    Quit date: 11/17/2018    Years since quitting: 1.7  . Smokeless tobacco: Never Used  . Tobacco comment: 4 per day  Vaping Use  . Vaping Use: Never used  Substance and Sexual Activity  . Alcohol use: Not Currently    Comment: recovery ETOH - last drink 2/5  . Drug use: Not Currently  . Sexual activity: Not on file

## 2020-08-19 ENCOUNTER — Telehealth: Payer: Self-pay | Admitting: Gastroenterology

## 2020-08-19 NOTE — Telephone Encounter (Signed)
[facility] HIM Dept received 18 pages of medical records from [facility] Dr. Lacey Jensen. Sending via interoffice mail to Dr. Tarri Glenn  08/19/20  Alesia Morin

## 2020-09-10 NOTE — Telephone Encounter (Signed)
Three attempts have been made to obtain records from Dr. Lacey Jensen but all attempts were unsuccessful.

## 2020-09-18 ENCOUNTER — Other Ambulatory Visit: Payer: Self-pay | Admitting: Gastroenterology

## 2020-09-22 ENCOUNTER — Telehealth: Payer: Self-pay | Admitting: Gastroenterology

## 2020-09-22 NOTE — Telephone Encounter (Signed)
Please see comments below from Dr. Tarri Glenn regarding rescheduling the endoscopy.

## 2020-09-22 NOTE — Telephone Encounter (Signed)
Spoke with pt and let him know that his appt has been cancelled and for him to contact the office when he is ready to reschedule the EGD at his convenience. Pt verbalized understanding.

## 2020-09-22 NOTE — Telephone Encounter (Signed)
Noted. May reschedule at his convenience.

## 2020-09-23 ENCOUNTER — Encounter: Payer: 59 | Admitting: Gastroenterology

## 2020-12-03 ENCOUNTER — Other Ambulatory Visit: Payer: Self-pay | Admitting: Gastroenterology

## 2020-12-08 ENCOUNTER — Telehealth: Payer: Self-pay | Admitting: *Deleted

## 2020-12-08 ENCOUNTER — Ambulatory Visit (AMBULATORY_SURGERY_CENTER): Payer: 59 | Admitting: *Deleted

## 2020-12-08 ENCOUNTER — Other Ambulatory Visit: Payer: Self-pay

## 2020-12-08 VITALS — Ht 74.0 in | Wt 205.0 lb

## 2020-12-08 DIAGNOSIS — K259 Gastric ulcer, unspecified as acute or chronic, without hemorrhage or perforation: Secondary | ICD-10-CM

## 2020-12-08 NOTE — Progress Notes (Signed)
Patient's pre-visit was done today over the phone with the patient due to COVID-19 pandemic. Name,DOB and address verified. Insurance verified. Patient denies any allergies to Eggs and Soy. Patient denies any problems with anesthesia/sedation. Patient denies taking diet pills or blood thinners. Packet of Prep instructions mailed to patient including a copy of a consent form-pt is aware. Patient understands to call us back with any questions or concerns. The patient is COVID-19 fully vaccinated, per patient. Patient is aware of our care-partner policy and [id number] safety protocol. EMMI education assigned to the patient for the procedure, sent to [place].

## 2020-12-08 NOTE — Telephone Encounter (Signed)
Spoke with the patient, pre-visit completed.

## 2020-12-08 NOTE — Telephone Encounter (Signed)
Patient did not answer call for the [facility] appointment for today. I called patient, no answer, left a message for the patient to call us back.  Called patient 5 mins later. Patient did not answer call for the [facility] appointment for today. I called patient, no answer, left a message for the patient to call us back today before 5 pm or the PV and procedure will be cancelled

## 2020-12-13 ENCOUNTER — Inpatient Hospital Stay (HOSPITAL_COMMUNITY)
Admission: EM | Admit: 2020-12-13 | Discharge: 2020-12-18 | DRG: 336 | Disposition: A | Discharge status: Home / Self Care | Payer: 59 | Attending: General Surgery | Admitting: General Surgery

## 2020-12-13 ENCOUNTER — Encounter (HOSPITAL_COMMUNITY): Admission: EM | Disposition: A | Discharge status: Home / Self Care | Payer: Self-pay | Source: Home / Self Care

## 2020-12-13 ENCOUNTER — Encounter (HOSPITAL_COMMUNITY): Payer: Self-pay

## 2020-12-13 ENCOUNTER — Emergency Department (HOSPITAL_COMMUNITY): Payer: 59 | Admitting: Certified Registered Nurse Anesthetist

## 2020-12-13 ENCOUNTER — Other Ambulatory Visit: Payer: Self-pay

## 2020-12-13 ENCOUNTER — Emergency Department (HOSPITAL_COMMUNITY): Payer: 59

## 2020-12-13 DIAGNOSIS — K565 Intestinal adhesions [bands], unspecified as to partial versus complete obstruction: Secondary | ICD-10-CM | POA: Diagnosis present

## 2020-12-13 DIAGNOSIS — Z87891 Personal history of nicotine dependence: Secondary | ICD-10-CM

## 2020-12-13 DIAGNOSIS — F411 Generalized anxiety disorder: Secondary | ICD-10-CM | POA: Diagnosis present

## 2020-12-13 DIAGNOSIS — Z79890 Hormone replacement therapy (postmenopausal): Secondary | ICD-10-CM | POA: Diagnosis not present

## 2020-12-13 DIAGNOSIS — K56609 Unspecified intestinal obstruction, unspecified as to partial versus complete obstruction: Secondary | ICD-10-CM

## 2020-12-13 DIAGNOSIS — G8929 Other chronic pain: Secondary | ICD-10-CM | POA: Diagnosis present

## 2020-12-13 DIAGNOSIS — R1084 Generalized abdominal pain: Secondary | ICD-10-CM | POA: Diagnosis not present

## 2020-12-13 DIAGNOSIS — Z85850 Personal history of malignant neoplasm of thyroid: Secondary | ICD-10-CM

## 2020-12-13 DIAGNOSIS — R188 Other ascites: Secondary | ICD-10-CM | POA: Diagnosis present

## 2020-12-13 DIAGNOSIS — I10 Essential (primary) hypertension: Secondary | ICD-10-CM | POA: Diagnosis present

## 2020-12-13 DIAGNOSIS — D72828 Other elevated white blood cell count: Secondary | ICD-10-CM | POA: Diagnosis present

## 2020-12-13 DIAGNOSIS — K219 Gastro-esophageal reflux disease without esophagitis: Secondary | ICD-10-CM | POA: Diagnosis present

## 2020-12-13 DIAGNOSIS — Z79899 Other long term (current) drug therapy: Secondary | ICD-10-CM | POA: Diagnosis not present

## 2020-12-13 DIAGNOSIS — Z20822 Contact with and (suspected) exposure to covid-19: Secondary | ICD-10-CM | POA: Diagnosis present

## 2020-12-13 DIAGNOSIS — E890 Postprocedural hypothyroidism: Secondary | ICD-10-CM | POA: Diagnosis present

## 2020-12-13 HISTORY — DX: Malignant (primary) neoplasm, unspecified: C80.1

## 2020-12-13 HISTORY — PX: LAPAROTOMY: SHX154

## 2020-12-13 HISTORY — PX: SMALL INTESTINE SURGERY: SHX150

## 2020-12-13 HISTORY — DX: Unspecified intestinal obstruction, unspecified as to partial versus complete obstruction: K56.609

## 2020-12-13 LAB — CBC WITH DIFFERENTIAL/PLATELET
Abs Immature Granulocytes: 0.06 10*3/uL (ref 0.00–0.07)
Basophils Absolute: 0 10*3/uL (ref 0.0–0.1)
Basophils Relative: 0 %
Eosinophils Absolute: 0 10*3/uL (ref 0.0–0.5)
Eosinophils Relative: 0 %
HCT: 47 % (ref 39.0–52.0)
Hemoglobin: 16.5 g/dL (ref 13.0–17.0)
Immature Granulocytes: 0 %
Lymphocytes Relative: 13 %
Lymphs Abs: 1.8 10*3/uL (ref 0.7–4.0)
MCH: 34.2 pg — ABNORMAL HIGH (ref 26.0–34.0)
MCHC: 35.1 g/dL (ref 30.0–36.0)
MCV: 97.5 fL (ref 80.0–100.0)
Monocytes Absolute: 0.8 10*3/uL (ref 0.1–1.0)
Monocytes Relative: 5 %
Neutro Abs: 11.4 10*3/uL — ABNORMAL HIGH (ref 1.7–7.7)
Neutrophils Relative %: 82 %
Platelets: 372 10*3/uL (ref 150–400)
RBC: 4.82 MIL/uL (ref 4.22–5.81)
RDW: 12.7 % (ref 11.5–15.5)
WBC: 14.1 10*3/uL — ABNORMAL HIGH (ref 4.0–10.5)
nRBC: 0 % (ref 0.0–0.2)

## 2020-12-13 LAB — COMPREHENSIVE METABOLIC PANEL
ALT: 34 U/L (ref 0–44)
AST: 26 U/L (ref 15–41)
Albumin: 5 g/dL (ref 3.5–5.0)
Alkaline Phosphatase: 56 U/L (ref 38–126)
Anion gap: 12 (ref 5–15)
BUN: 14 mg/dL (ref 8–23)
CO2: 27 mmol/L (ref 22–32)
Calcium: 10.2 mg/dL (ref 8.9–10.3)
Chloride: 101 mmol/L (ref 98–111)
Creatinine, Ser: 1 mg/dL (ref 0.61–1.24)
GFR, Estimated: >60 mL/min (ref >60)
Glucose, Bld: 147 mg/dL — ABNORMAL HIGH (ref 70–99)
Potassium: 4.3 mmol/L (ref 3.5–5.1)
Sodium: 140 mmol/L (ref 135–145)
Total Bilirubin: 1 mg/dL (ref 0.3–1.2)
Total Protein: 8.1 g/dL (ref 6.5–8.1)

## 2020-12-13 LAB — RESP PANEL BY RT-PCR (FLU A&B, COVID) ARPGX2
Influenza A by PCR: NEGATIVE
Influenza B by PCR: NEGATIVE
SARS Coronavirus 2 by RT PCR: NEGATIVE

## 2020-12-13 LAB — LIPASE, BLOOD: Lipase: 30 U/L (ref 11–51)

## 2020-12-13 SURGERY — LAPAROTOMY, EXPLORATORY
Anesthesia: General

## 2020-12-13 MED ORDER — SODIUM CHLORIDE 0.9 % IR SOLN
Status: DC | PRN
  Administered 2020-12-13 (×3): 1000 mL

## 2020-12-13 MED ORDER — ONDANSETRON HCL 4 MG/2ML IJ SOLN: 4.0000 mg | Freq: Once | INTRAMUSCULAR | Status: DC | PRN

## 2020-12-13 MED ORDER — MIDAZOLAM HCL 5 MG/5ML IJ SOLN
INTRAMUSCULAR | Status: DC | PRN
  Administered 2020-12-13: 2 mg via INTRAVENOUS

## 2020-12-13 MED ORDER — SUGAMMADEX SODIUM 200 MG/2ML IV SOLN
INTRAVENOUS | Status: DC | PRN
  Administered 2020-12-13: 400 mg via INTRAVENOUS

## 2020-12-13 MED ORDER — ONDANSETRON HCL 4 MG/2ML IJ SOLN
4.0000 mg | Freq: Once | INTRAMUSCULAR | Status: AC
  Administered 2020-12-13: 4 mg via INTRAVENOUS
  Filled 2020-12-13: qty 2

## 2020-12-13 MED ORDER — ROCURONIUM BROMIDE 10 MG/ML (PF) SYRINGE
PREFILLED_SYRINGE | INTRAVENOUS | Status: DC | PRN
  Administered 2020-12-13: 80 mg via INTRAVENOUS

## 2020-12-13 MED ORDER — OXYCODONE HCL 5 MG/5ML PO SOLN: 5.0000 mg | Freq: Once | ORAL | Status: DC | PRN

## 2020-12-13 MED ORDER — ACETAMINOPHEN 160 MG/5ML PO SOLN: 325.0000 mg | ORAL | Status: DC | PRN

## 2020-12-13 MED ORDER — MEPERIDINE HCL 50 MG/ML IJ SOLN: 6.2500 mg | INTRAMUSCULAR | Status: DC | PRN

## 2020-12-13 MED ORDER — FENTANYL CITRATE (PF) 250 MCG/5ML IJ SOLN
INTRAMUSCULAR | Status: DC | PRN
  Administered 2020-12-13: 100 ug via INTRAVENOUS

## 2020-12-13 MED ORDER — SUCCINYLCHOLINE CHLORIDE 200 MG/10ML IV SOSY
PREFILLED_SYRINGE | INTRAVENOUS | Status: DC | PRN
  Administered 2020-12-13: 200 mg via INTRAVENOUS

## 2020-12-13 MED ORDER — HYDROMORPHONE HCL 1 MG/ML IJ SOLN
1.0000 mg | Freq: Once | INTRAMUSCULAR | Status: AC
  Administered 2020-12-13: 1 mg via INTRAVENOUS
  Filled 2020-12-13: qty 1

## 2020-12-13 MED ORDER — LACTATED RINGERS IV SOLN: INTRAVENOUS | Status: DC | PRN

## 2020-12-13 MED ORDER — ACETAMINOPHEN 325 MG PO TABS
325.0000 mg | ORAL_TABLET | ORAL | Status: DC | PRN
Start: 2020-12-13 — End: 2020-12-14

## 2020-12-13 MED ORDER — PIPERACILLIN-TAZOBACTAM 3.375 G IVPB 30 MIN
3.3750 g | Freq: Once | INTRAVENOUS | Status: AC
  Administered 2020-12-13: 3.375 g via INTRAVENOUS
  Filled 2020-12-13: qty 50

## 2020-12-13 MED ORDER — ONDANSETRON HCL 4 MG/2ML IJ SOLN
INTRAMUSCULAR | Status: DC | PRN
  Administered 2020-12-13: 4 mg via INTRAVENOUS

## 2020-12-13 MED ORDER — PROPOFOL 10 MG/ML IV BOLUS
INTRAVENOUS | Status: AC
  Filled 2020-12-13: qty 20

## 2020-12-13 MED ORDER — FENTANYL CITRATE (PF) 100 MCG/2ML IJ SOLN
INTRAMUSCULAR | Status: AC
  Filled 2020-12-13: qty 2

## 2020-12-13 MED ORDER — PROPOFOL 10 MG/ML IV BOLUS
INTRAVENOUS | Status: DC | PRN
Start: 2020-12-13 — End: 2020-12-14
  Administered 2020-12-13: 200 mg via INTRAVENOUS

## 2020-12-13 MED ORDER — IOHEXOL 300 MG/ML  SOLN
100.0000 mL | Freq: Once | INTRAMUSCULAR | Status: AC | PRN
  Administered 2020-12-13: 100 mL via INTRAVENOUS

## 2020-12-13 MED ORDER — MIDAZOLAM HCL 2 MG/2ML IJ SOLN
INTRAMUSCULAR | Status: AC
  Filled 2020-12-13: qty 2

## 2020-12-13 MED ORDER — SODIUM CHLORIDE 0.9 % IV BOLUS
1000.0000 mL | Freq: Once | INTRAVENOUS | Status: AC
  Administered 2020-12-13: 1000 mL via INTRAVENOUS

## 2020-12-13 MED ORDER — DEXAMETHASONE SODIUM PHOSPHATE 10 MG/ML IJ SOLN
INTRAMUSCULAR | Status: DC | PRN
  Administered 2020-12-13: 8 mg via INTRAVENOUS

## 2020-12-13 MED ORDER — LIDOCAINE 2% (20 MG/ML) 5 ML SYRINGE
INTRAMUSCULAR | Status: AC
  Filled 2020-12-13: qty 5

## 2020-12-13 MED ORDER — ONDANSETRON HCL 4 MG/2ML IJ SOLN
INTRAMUSCULAR | Status: AC
  Filled 2020-12-13: qty 2

## 2020-12-13 MED ORDER — MORPHINE SULFATE (PF) 4 MG/ML IV SOLN
4.0000 mg | Freq: Once | INTRAVENOUS | Status: AC
Start: 2020-12-13 — End: 2020-12-13
  Administered 2020-12-13: 4 mg via INTRAVENOUS
  Filled 2020-12-13: qty 1

## 2020-12-13 MED ORDER — DEXMEDETOMIDINE (PRECEDEX) IN NS 20 MCG/5ML (4 MCG/ML) IV SYRINGE
PREFILLED_SYRINGE | INTRAVENOUS | Status: DC | PRN
  Administered 2020-12-13: 20 ug via INTRAVENOUS
  Administered 2020-12-13 (×2): 10 ug via INTRAVENOUS

## 2020-12-13 MED ORDER — FENTANYL CITRATE (PF) 100 MCG/2ML IJ SOLN
25.0000 ug | INTRAMUSCULAR | Status: DC | PRN
  Administered 2020-12-14 (×3): 50 ug via INTRAVENOUS

## 2020-12-13 MED ORDER — KETAMINE HCL 10 MG/ML IJ SOLN
INTRAMUSCULAR | Status: AC
  Filled 2020-12-13: qty 1

## 2020-12-13 MED ORDER — LIDOCAINE 2% (20 MG/ML) 5 ML SYRINGE
INTRAMUSCULAR | Status: AC
  Filled 2020-12-13: qty 10

## 2020-12-13 MED ORDER — KETAMINE HCL 10 MG/ML IJ SOLN
INTRAMUSCULAR | Status: DC | PRN
  Administered 2020-12-13: 50 mg via INTRAVENOUS

## 2020-12-13 MED ORDER — ROCURONIUM BROMIDE 10 MG/ML (PF) SYRINGE
PREFILLED_SYRINGE | INTRAVENOUS | Status: AC
  Filled 2020-12-13: qty 10

## 2020-12-13 MED ORDER — LIDOCAINE 2% (20 MG/ML) 5 ML SYRINGE
INTRAMUSCULAR | Status: DC | PRN
  Administered 2020-12-13: 100 mg via INTRAVENOUS

## 2020-12-13 MED ORDER — SUGAMMADEX SODIUM 500 MG/5ML IV SOLN
INTRAVENOUS | Status: AC
  Filled 2020-12-13: qty 5

## 2020-12-13 MED ORDER — DEXAMETHASONE SODIUM PHOSPHATE 10 MG/ML IJ SOLN
INTRAMUSCULAR | Status: AC
  Filled 2020-12-13: qty 1

## 2020-12-13 MED ORDER — OXYCODONE HCL 5 MG PO TABS: 5.0000 mg | ORAL_TABLET | Freq: Once | ORAL | Status: DC | PRN

## 2020-12-13 MED ORDER — HYDROMORPHONE HCL 1 MG/ML IJ SOLN
1.0000 mg | Freq: Once | INTRAMUSCULAR | Status: AC
Start: 2020-12-13 — End: 2020-12-13
  Administered 2020-12-13: 1 mg via INTRAVENOUS
  Filled 2020-12-13: qty 1

## 2020-12-13 MED ORDER — SUCCINYLCHOLINE CHLORIDE 200 MG/10ML IV SOSY
PREFILLED_SYRINGE | INTRAVENOUS | Status: AC
  Filled 2020-12-13: qty 10

## 2020-12-13 SURGICAL SUPPLY — 43 items
BLADE EXTENDED COATED 6.5IN (ELECTRODE) IMPLANT
CELLS DAT CNTRL 66122 CELL SVR (MISCELLANEOUS) IMPLANT
CHLORAPREP W/TINT 26 (MISCELLANEOUS) ×2 IMPLANT
COVER WAND RF STERILE (DRAPES) IMPLANT
DRAIN CHANNEL 19F RND (DRAIN) IMPLANT
DRAPE LAPAROSCOPIC ABDOMINAL (DRAPES) ×2 IMPLANT
DRAPE SHEET LG 3/4 BI-LAMINATE (DRAPES) IMPLANT
DRSG OPSITE POSTOP 4X10 (GAUZE/BANDAGES/DRESSINGS) IMPLANT
DRSG OPSITE POSTOP 4X6 (GAUZE/BANDAGES/DRESSINGS) IMPLANT
DRSG OPSITE POSTOP 4X8 (GAUZE/BANDAGES/DRESSINGS) ×2 IMPLANT
ELECT REM PT RETURN 15FT ADLT (MISCELLANEOUS) ×2 IMPLANT
EVACUATOR SILICONE 100CC (DRAIN) IMPLANT
GAUZE SPONGE 4X4 12PLY STRL (GAUZE/BANDAGES/DRESSINGS) IMPLANT
GLOVE SURG ENC MOIS LTX SZ6.5 (GLOVE) ×4 IMPLANT
GLOVE SURG UNDER POLY LF SZ7 (GLOVE) ×4 IMPLANT
GOWN STRL REUS W/TWL XL LVL3 (GOWN DISPOSABLE) ×6 IMPLANT
HANDLE SUCTION POOLE (INSTRUMENTS) IMPLANT
KIT TURNOVER KIT A (KITS) ×2 IMPLANT
LEGGING LITHOTOMY PAIR STRL (DRAPES) IMPLANT
PACK COLON (CUSTOM PROCEDURE TRAY) ×2 IMPLANT
PENCIL SMOKE EVACUATOR (MISCELLANEOUS) IMPLANT
RETRACTOR WND ALEXIS 25 LRG (MISCELLANEOUS) IMPLANT
RTRCTR WOUND ALEXIS 18CM MED (MISCELLANEOUS)
RTRCTR WOUND ALEXIS 25CM LRG (MISCELLANEOUS)
STAPLER VISISTAT 35W (STAPLE) ×2 IMPLANT
SUCTION POOLE HANDLE (INSTRUMENTS)
SUT ETHILON 3 0 PS 1 (SUTURE) IMPLANT
SUT NOVA 1 T20/GS 25DT (SUTURE) ×6 IMPLANT
SUT PDS AB 1 CTX 36 (SUTURE) IMPLANT
SUT SILK 2 0 (SUTURE) ×1
SUT SILK 2 0 SH CR/8 (SUTURE) ×2 IMPLANT
SUT SILK 2-0 18XBRD TIE 12 (SUTURE) ×1 IMPLANT
SUT SILK 3 0 (SUTURE) ×1
SUT SILK 3 0 SH CR/8 (SUTURE) ×2 IMPLANT
SUT SILK 3-0 18XBRD TIE 12 (SUTURE) ×1 IMPLANT
SUT VIC AB 2-0 SH 18 (SUTURE) ×2 IMPLANT
SUT VIC AB 2-0 SH 27 (SUTURE) ×1
SUT VIC AB 2-0 SH 27X BRD (SUTURE) ×1 IMPLANT
SUT VIC AB 4-0 PS2 27 (SUTURE) ×2 IMPLANT
TOWEL OR 17X26 10 PK STRL BLUE (TOWEL DISPOSABLE) ×2 IMPLANT
TOWEL OR NON WOVEN STRL DISP B (DISPOSABLE) ×2 IMPLANT
TRAY FOLEY MTR SLVR 16FR STAT (SET/KITS/TRAYS/PACK) ×2 IMPLANT
TUBING CONNECTING 10 (TUBING) ×4 IMPLANT

## 2020-12-13 NOTE — H&P (Signed)
CC: Abdominal pain   HPI: Vernon Fowler is an 65 y.o. male who is here for acute onset of abdominal pain earlier this morning.  Pain is colicky in nature.  Occurs throughout the abdomen.  Associated with nausea.  No fevers chills constipation or diarrhea.  No past surgical history within the abdomen.  Past Medical History:  Diagnosis Date  . Alcohol abuse   . Anxiety   . Cancer ([place])   . GERD (gastroesophageal reflux disease)   . Hypertension   . Sleep apnea    w/CPAP    Past Surgical History:  Procedure Laterality Date  . COLONOSCOPY    . FASCIOTOMY Right 10/2019  . SHOULDER ARTHROSCOPY WITH ROTATOR CUFF REPAIR Right 03/01/2019   Procedure: Right shoulder arthroscopy, subacromial decompression, distal clavicle resection, rotator cuff repair;  Surgeon: Justice Britain, MD;  Location: [facility];  Service: Orthopedics;  Laterality: Right;  . THYROIDECTOMY     1987    Family History  Problem Relation Age of Onset  . Cancer Mother   . Anxiety disorder Mother   . Diabetes Father   . Hyperlipidemia Father   . Hypertension Father   . Cancer Maternal Grandmother   . Cancer Paternal Grandmother   . Heart disease Paternal Grandfather   . Anxiety disorder Daughter   . Colon cancer Neg Hx   . Esophageal cancer Neg Hx   . Stomach cancer Neg Hx   . Rectal cancer Neg Hx     Social:  reports that he quit smoking about 2 years ago. His smoking use included cigarettes. He has a 1.00 pack-year smoking history. He has never used smokeless tobacco. He reports previous alcohol use. He reports previous drug use.  Allergies:  Allergies  Allergen Reactions  . Sulfa Antibiotics Rash    Fixed based skin reaction    Medications: I have reviewed the patient's current medications.  Results for orders placed or performed during the hospital encounter of 12/13/20 (from the past 48 hour(s))  CBC with Differential     Status: Abnormal   Collection Time: 12/13/20  6:31 PM  Result Value Ref Range    WBC 14.1 (H) 4.0 - 10.5 K/uL   RBC 4.82 4.22 - 5.81 MIL/uL   Hemoglobin 16.5 13.0 - 17.0 g/dL   HCT 47.0 39.0 - 52.0 %   MCV 97.5 80.0 - 100.0 fL   MCH 34.2 (H) 26.0 - 34.0 pg   MCHC 35.1 30.0 - 36.0 g/dL   RDW 12.7 11.5 - 15.5 %   Platelets 372 150 - 400 K/uL   nRBC 0.0 0.0 - 0.2 %   Neutrophils Relative % 82 %   Neutro Abs 11.4 (H) 1.7 - 7.7 K/uL   Lymphocytes Relative 13 %   Lymphs Abs 1.8 0.7 - 4.0 K/uL   Monocytes Relative 5 %   Monocytes Absolute 0.8 0.1 - 1.0 K/uL   Eosinophils Relative 0 %   Eosinophils Absolute 0.0 0.0 - 0.5 K/uL   Basophils Relative 0 %   Basophils Absolute 0.0 0.0 - 0.1 K/uL   Immature Granulocytes 0 %   Abs Immature Granulocytes 0.06 0.00 - 0.07 K/uL    Comment: Performed at [facility], [place] [address], [place], [place] [address]  Comprehensive metabolic panel     Status: Abnormal   Collection Time: 12/13/20  6:31 PM  Result Value Ref Range   Sodium 140 135 - 145 mmol/L   Potassium 4.3 3.5 - 5.1 mmol/L   Chloride  101 98 - 111 mmol/L   CO2 27 22 - 32 mmol/L   Glucose, Bld 147 (H) 70 - 99 mg/dL    Comment: Glucose reference range applies only to samples taken after fasting for at least 8 hours.   BUN 14 8 - 23 mg/dL   Creatinine, Ser 1.00 0.61 - 1.24 mg/dL   Calcium 10.2 8.9 - 10.3 mg/dL   Total Protein 8.1 6.5 - 8.1 g/dL   Albumin 5.0 3.5 - 5.0 g/dL   AST 26 15 - 41 U/L   ALT 34 0 - 44 U/L   Alkaline Phosphatase 56 38 - 126 U/L   Total Bilirubin 1.0 0.3 - 1.2 mg/dL   GFR, Estimated >60 >60 mL/min    Comment: (NOTE) Calculated using the CKD-EPI Creatinine Equation (2021)    Anion gap 12 5 - 15    Comment: Performed at [facility], [place] [address], [place], [place] [address]  Lipase, blood     Status: None   Collection Time: 12/13/20  6:31 PM  Result Value Ref Range   Lipase 30 11 - 51 U/L    Comment: Performed at [facility], [place] [address] [place], [place] [address]    CT  Abdomen Pelvis W Contrast  Result Date: 12/13/2020 CLINICAL DATA:  Nonlocalized acute abdominal pain. EXAM: CT ABDOMEN AND PELVIS WITH CONTRAST TECHNIQUE: Multidetector CT imaging of the abdomen and pelvis was performed using the standard protocol following bolus administration of intravenous contrast. CONTRAST:  156mL OMNIPAQUE IOHEXOL 300 MG/ML  SOLN COMPARISON:  CT angio chest abdomen pelvis 09/25/2019 FINDINGS: Lower chest: No acute abnormality. Hepatobiliary: Redemonstration of fluid density lesion within the left hepatic lobe that likely represents a simple hepatic cyst (2:23). Subcentimeter hypodensity is too small to characterize. No focal liver abnormality. No gallstones, gallbladder wall thickening, or pericholecystic fluid. No biliary dilatation. Pancreas: No focal lesion. Normal pancreatic contour. No surrounding inflammatory changes. No main pancreatic ductal dilatation. Spleen: Normal in size without focal abnormality. Adrenals/Urinary Tract: No adrenal nodule bilaterally. Bilateral kidneys enhance symmetrically. No hydronephrosis. No hydroureter. The urinary bladder is unremarkable. On delayed imaging, there is no urothelial wall thickening and there are no filling defects in the opacified portions of the bilateral collecting systems or ureters. Stomach/Bowel: PO contrast administered and noted to layer within some loops of the small bowel. Stomach is within normal limits. Several loops of left upper abdomen small bowel are dilated with fluid. Associated mesenteric edema is noted. Likely associated closed loop obstruction with transition points best seen on coronal imaging (4:76). No evidence of large bowel wall thickening or dilatation. Appendix appears normal. Vascular/Lymphatic: No abdominal aorta or iliac aneurysm. Mild atherosclerotic plaque of the aorta and its branches. No abdominal, pelvic, or inguinal lymphadenopathy. Reproductive: Prominent prostate. Other: Trace simple free fluid. No  intraperitoneal free gas. No organized fluid collection. Musculoskeletal: No abdominal wall hernia or abnormality. No suspicious lytic or blastic osseous lesions. No acute displaced fracture. IMPRESSION: 1. Findings consistent with vascular compromise of the small bowel and concern for ischemia. Possible closed loop obstruction versus internal hernia. Recommend emergent surgical consultation. 2. Trace simple free fluid ascites. These results were called by telephone at the time of interpretation on 12/13/2020 at 9:15 pm to provider [facility] , who verbally acknowledged these results. Electronically Signed   By: Iven Finn M.D.   On: 12/13/2020 21:19    ROS - all of the below systems have been reviewed with the patient and positives  are indicated with bold text General: chills, fever or night sweats Eyes: blurry vision or double vision ENT: epistaxis or sore throat Allergy/Immunology: itchy/watery eyes or nasal congestion Hematologic/Lymphatic: bleeding problems, blood clots or swollen lymph nodes Endocrine: temperature intolerance or unexpected weight changes Breast: new or changing breast lumps or nipple discharge Resp: cough, shortness of breath, or wheezing CV: chest pain or dyspnea on exertion GI: as per HPI GU: dysuria, trouble voiding, or hematuria MSK: joint pain or joint stiffness Neuro: TIA or stroke symptoms Derm: pruritus and skin lesion changes Psych: anxiety and depression  PE Blood pressure 135/79, pulse 75, temperature 98.5 F (36.9 C), temperature source Oral, resp. rate 17, height 6\' 2"  (1.88 m), weight 93.9 kg, SpO2 96 %. Constitutional: NAD; conversant; no deformities Eyes: Moist conjunctiva; no lid lag; anicteric; PERRL Neck: Trachea midline; no thyromegaly Lungs: Normal respiratory effort; no tactile fremitus CV: RRR; no palpable thrills; no pitting edema GI: Abd soft, tender to palpation diffusely; no palpable hepatosplenomegaly MSK: Normal range of motion  of extremities; no clubbing/cyanosis Psychiatric: Appropriate affect; alert and oriented x3 Lymphatic: No palpable cervical or axillary lymphadenopathy  Results for orders placed or performed during the hospital encounter of 12/13/20 (from the past 48 hour(s))  CBC with Differential     Status: Abnormal   Collection Time: 12/13/20  6:31 PM  Result Value Ref Range   WBC 14.1 (H) 4.0 - 10.5 K/uL   RBC 4.82 4.22 - 5.81 MIL/uL   Hemoglobin 16.5 13.0 - 17.0 g/dL   HCT 47.0 39.0 - 52.0 %   MCV 97.5 80.0 - 100.0 fL   MCH 34.2 (H) 26.0 - 34.0 pg   MCHC 35.1 30.0 - 36.0 g/dL   RDW 12.7 11.5 - 15.5 %   Platelets 372 150 - 400 K/uL   nRBC 0.0 0.0 - 0.2 %   Neutrophils Relative % 82 %   Neutro Abs 11.4 (H) 1.7 - 7.7 K/uL   Lymphocytes Relative 13 %   Lymphs Abs 1.8 0.7 - 4.0 K/uL   Monocytes Relative 5 %   Monocytes Absolute 0.8 0.1 - 1.0 K/uL   Eosinophils Relative 0 %   Eosinophils Absolute 0.0 0.0 - 0.5 K/uL   Basophils Relative 0 %   Basophils Absolute 0.0 0.0 - 0.1 K/uL   Immature Granulocytes 0 %   Abs Immature Granulocytes 0.06 0.00 - 0.07 K/uL    Comment: Performed at [facility], [place] [address], [place], [place] [address]  Comprehensive metabolic panel     Status: Abnormal   Collection Time: 12/13/20  6:31 PM  Result Value Ref Range   Sodium 140 135 - 145 mmol/L   Potassium 4.3 3.5 - 5.1 mmol/L   Chloride 101 98 - 111 mmol/L   CO2 27 22 - 32 mmol/L   Glucose, Bld 147 (H) 70 - 99 mg/dL    Comment: Glucose reference range applies only to samples taken after fasting for at least 8 hours.   BUN 14 8 - 23 mg/dL   Creatinine, Ser 1.00 0.61 - 1.24 mg/dL   Calcium 10.2 8.9 - 10.3 mg/dL   Total Protein 8.1 6.5 - 8.1 g/dL   Albumin 5.0 3.5 - 5.0 g/dL   AST 26 15 - 41 U/L   ALT 34 0 - 44 U/L   Alkaline Phosphatase 56 38 - 126 U/L   Total Bilirubin 1.0 0.3 - 1.2 mg/dL   GFR, Estimated >60 >60 mL/min    Comment: (NOTE) Calculated  using the CKD-EPI Creatinine  Equation (2021)    Anion gap 12 5 - 15    Comment: Performed at [facility], [place] [address] [place], [place] [address]  Lipase, blood     Status: None   Collection Time: 12/13/20  6:31 PM  Result Value Ref Range   Lipase 30 11 - 51 U/L    Comment: Performed at [facility], [place] [address] [place], [place] [address]    CT Abdomen Pelvis W Contrast  Result Date: 12/13/2020 CLINICAL DATA:  Nonlocalized acute abdominal pain. EXAM: CT ABDOMEN AND PELVIS WITH CONTRAST TECHNIQUE: Multidetector CT imaging of the abdomen and pelvis was performed using the standard protocol following bolus administration of intravenous contrast. CONTRAST:  166mL OMNIPAQUE IOHEXOL 300 MG/ML  SOLN COMPARISON:  CT angio chest abdomen pelvis 09/25/2019 FINDINGS: Lower chest: No acute abnormality. Hepatobiliary: Redemonstration of fluid density lesion within the left hepatic lobe that likely represents a simple hepatic cyst (2:23). Subcentimeter hypodensity is too small to characterize. No focal liver abnormality. No gallstones, gallbladder wall thickening, or pericholecystic fluid. No biliary dilatation. Pancreas: No focal lesion. Normal pancreatic contour. No surrounding inflammatory changes. No main pancreatic ductal dilatation. Spleen: Normal in size without focal abnormality. Adrenals/Urinary Tract: No adrenal nodule bilaterally. Bilateral kidneys enhance symmetrically. No hydronephrosis. No hydroureter. The urinary bladder is unremarkable. On delayed imaging, there is no urothelial wall thickening and there are no filling defects in the opacified portions of the bilateral collecting systems or ureters. Stomach/Bowel: PO contrast administered and noted to layer within some loops of the small bowel. Stomach is within normal limits. Several loops of left upper abdomen small bowel are dilated with fluid. Associated mesenteric edema is noted. Likely associated closed loop obstruction with  transition points best seen on coronal imaging (4:76). No evidence of large bowel wall thickening or dilatation. Appendix appears normal. Vascular/Lymphatic: No abdominal aorta or iliac aneurysm. Mild atherosclerotic plaque of the aorta and its branches. No abdominal, pelvic, or inguinal lymphadenopathy. Reproductive: Prominent prostate. Other: Trace simple free fluid. No intraperitoneal free gas. No organized fluid collection. Musculoskeletal: No abdominal wall hernia or abnormality. No suspicious lytic or blastic osseous lesions. No acute displaced fracture. IMPRESSION: 1. Findings consistent with vascular compromise of the small bowel and concern for ischemia. Possible closed loop obstruction versus internal hernia. Recommend emergent surgical consultation. 2. Trace simple free fluid ascites. These results were called by telephone at the time of interpretation on 12/13/2020 at 9:15 pm to provider [facility] , who verbally acknowledged these results. Electronically Signed   By: Iven Finn M.D.   On: 12/13/2020 21:19     A/P: Vernon Fowler is an 65 y.o. male with what appears to be a closed-loop small bowel obstruction.  Patient with significant mesenteric edema on CT scan and elevated white count concerning for bowel compromise.  I have recommended an emergent exploratory laparotomy with possible bowel resection.  We have discussed this in detail.  Risk include bleeding, infection, damage to adjacent structures, hernia and need for additional procedures.  I believe the risk of nonintervention are much higher than surgery at this time and have recommended that we proceed.  Patient agrees.  All questions were answered.   Rosario Adie, MD  [facility]

## 2020-12-13 NOTE — ED Triage Notes (Signed)
Emergency Medicine Provider Triage Evaluation Note  Austine Wiedeman , a 65 y.o. male  was evaluated in triage.  Pt complains of generalized abdominal pain nausea.  Patient abdominal pain began this morning after waking.  Patient denies any fevers, chills, diarrhea, constipation, vomiting  Review of Systems  Positive: Abdominal pain, nausea Negative:  fevers, chills, diarrhea, constipation, vomiting   Physical Exam  BP (!) 148/91 (BP Location: Right Arm)   Pulse (!) 57   Temp 98.5 F (36.9 C) (Oral)   Resp 18   Ht 6\' 2"  (1.88 m)   Wt 93.9 kg   SpO2 100%   BMI 26.58 kg/m  Gen:   Awake, no distress   HEENT:  Atraumatic  Resp:  Normal effort  Cardiac:  Normal rate  Abd:   Nondistended, mild tenderness throughout abdomen MSK:   Moves extremities without difficulty  Neuro:  Speech clear   Medical Decision Making  Medically screening exam initiated at 5:05 PM.  Appropriate orders placed.  Kelden Lavallee was informed that the remainder of the evaluation will be completed by another provider, this initial triage assessment does not replace that evaluation, and the importance of remaining in the ED until their evaluation is complete.  Clinical Impression   The patient appears stable so that the remainder of the work up may be completed by another provider.      Loni Beckwith, [place] 12/13/20 1707

## 2020-12-13 NOTE — Anesthesia Procedure Notes (Signed)
Procedure Name: Intubation Performed by: Rosaland Lao, CRNA Pre-anesthesia Checklist: Patient identified, Emergency Drugs available, Suction available and Patient being monitored Patient Re-evaluated:Patient Re-evaluated prior to induction Oxygen Delivery Method: Circle system utilized Preoxygenation: Pre-oxygenation with 100% oxygen Induction Type: IV induction and Rapid sequence Laryngoscope Size: Mac and 4 Grade View: Grade II Tube type: Oral Number of attempts: 1 Airway Equipment and Method: Stylet and Oral airway Placement Confirmation: ETT inserted through vocal cords under direct vision,  positive ETCO2 and breath sounds checked- equal and bilateral Secured at: 23 cm Tube secured with: Tape Dental Injury: Teeth and Oropharynx as per pre-operative assessment

## 2020-12-13 NOTE — Anesthesia Preprocedure Evaluation (Addendum)
Anesthesia Evaluation  Patient identified by MRN, date of birth, ID band Patient awake    Reviewed: Allergy & Precautions, H&P , NPO status , Patient's Chart, lab work & pertinent test results  Airway Mallampati: II   Neck ROM: full    Dental   Pulmonary sleep apnea , former smoker,    breath sounds clear to auscultation       Cardiovascular hypertension, Pt. on medications and Pt. on home beta blockers  Rhythm:regular Rate:Normal     Neuro/Psych PSYCHIATRIC DISORDERS Anxiety    GI/Hepatic PUD, GERD  ,(+)     substance abuse  alcohol use,   Endo/Other  Hypothyroidism   Renal/GU      Musculoskeletal   Abdominal   Peds  Hematology   Anesthesia Other Findings   Reproductive/Obstetrics                             Anesthesia Physical  Anesthesia Plan  ASA: III and emergent  Anesthesia Plan: General   Post-op Pain Management:    Induction: Intravenous and Cricoid pressure planned  PONV Risk Score and Plan: 2 and Ondansetron, Dexamethasone and Treatment may vary due to age or medical condition  Airway Management Planned: Oral ETT  Additional Equipment: None  Intra-op Plan:   Post-operative Plan: Extubation in OR  Informed Consent: I have reviewed the patients History and Physical, chart, labs and discussed the procedure including the risks, benefits and alternatives for the proposed anesthesia with the patient or authorized representative who has indicated his/her understanding and acceptance.       Plan Discussed with: CRNA, Anesthesiologist and Surgeon  Anesthesia Plan Comments:        Anesthesia Quick Evaluation

## 2020-12-13 NOTE — Op Note (Signed)
12/13/2020  11:49 PM  PATIENT:  [name]  65 y.o. male  Patient Care Team: Shon Baton, MD as PCP - General (Internal Medicine)  PRE-OPERATIVE DIAGNOSIS:  closed loop bowel obstruction  POST-OPERATIVE DIAGNOSIS:  closed loop bowel obstruction  PROCEDURE:  EXPLORATORY LAPAROTOMY, LYSIS OF ADHESIONS    Surgeon(s): Leighton Ruff, MD  ASSISTANT: none   ANESTHESIA:   general  EBL: 10 ml Total I/O In: 1000 [I.V.:1000] Out: 60 [Urine:50; Blood:10]  DRAINS: none   SPECIMEN:  No Specimen  DISPOSITION OF SPECIMEN:  N/A  COUNTS:  YES  PLAN OF CARE: Admit to inpatient   PATIENT DISPOSITION:  PACU - hemodynamically stable.  INDICATION: 65 y.o. M with acute onset of abdominal pain.  Patient had an elevated white count and CT scan concerning for closed-loop bowel obstruction.  I recommended emergent surgery.   OR FINDINGS: Closed-loop bowel obstruction caused by omental adhesion in the left upper quadrant.  DESCRIPTION: the patient was identified in the preoperative holding area and taken to the OR where they were laid supine on the operating room table.  General anesthesia was induced without difficulty. SCDs were also noted to be in place prior to the initiation of anesthesia.  The patient was then prepped and draped in the usual sterile fashion.   A surgical timeout was performed indicating the correct patient, procedure, positioning and need for preoperative antibiotics.   I began by making an upper midline incision using a 10 blade scalpel.  This was carried down through the subcutaneous tissue using electrocautery.  I divided the fascia at midline.  I then entered the peritoneum bluntly.  I encountered some turbid, mildly purulent ascites.  I identified the dilated loop of bowel.  There was an adhesion from the omentum that was causing the obstruction.  This was isolated and divided using electrocautery.  I then ran the bowel from ligament of Treitz to terminal ileum.   There were no other signs of adhesions or obstruction.  An NG tube was placed and positioning was confirmed intraoperatively.  The abdomen was irrigated with 2 L of warm normal saline.  Hemostasis was good.  The fascia of the incision was closed using interrupted #1 Novafil sutures.  The subcutaneous tissue was reapproximated with a running 2-0 Vicryl suture.  The skin was closed using a running 4-0 Vicryl suture.  A sterile dressing was applied.  Patient was then awakened from anesthesia and sent to the postanesthesia care unit in stable condition.  All counts were correct per operating room staff.

## 2020-12-13 NOTE — ED Triage Notes (Signed)
Abdominal pain starting this morning, mild nausea, denies vomiting, diarrhea, or constipation.  Last BM this morning.

## 2020-12-13 NOTE — ED Provider Notes (Signed)
[facility] DEPT Provider Note   CSN: [id number] Arrival date & time: 12/13/20  1625     History Chief Complaint  Patient presents with  . Abdominal Pain    Vernon Fowler is a 65 y.o. male.  He has a history of reflux and ulcers and follows with [facility] Dr. Tarri Glenn.  He is complaining of acute onset diffuse abdominal pain that started this morning.  Rates it as 10 out of 10 initially now 6 out of 10 with episodes of spasm.  No fever.  Nausea 1 episode of vomiting.  Normal bowel movements.  No urinary symptoms.  No surgical history.  The history is provided by the patient.  Abdominal Pain Pain location:  Generalized Pain quality: cramping and stabbing   Pain radiates to:  Does not radiate Pain severity:  Severe Onset quality:  Sudden Timing:  Intermittent Progression:  Unchanged Chronicity:  New Context: recent travel   Context: not sick contacts and not trauma   Relieved by:  Nothing Worsened by:  Nothing Ineffective treatments:  OTC medications and vomiting Associated symptoms: nausea and vomiting   Associated symptoms: no chest pain, no constipation, no cough, no diarrhea, no dysuria, no fever, no hematemesis, no hematochezia, no hematuria, no shortness of breath and no sore throat   Risk factors: has not had multiple surgeries        Past Medical History:  Diagnosis Date  . Alcohol abuse   . Anxiety   . GERD (gastroesophageal reflux disease)   . Hypertension   . Sleep apnea    w/CPAP    Patient Active Problem List   Diagnosis Date Noted  . Pain of right shoulder joint on movement 03/12/2019  . Dupuytren contracture 12/23/2017  . Alcohol use disorder, severe, dependence ([place]) 12/09/2017  . Hypertension 10/28/2017  . GERD (gastroesophageal reflux disease) 10/28/2017  . Alcohol abuse 10/28/2017  . GAD (generalized anxiety disorder) 10/28/2017  . PUD (peptic ulcer disease) 10/01/2011  . Thyroid cancer ([place]) 03/31/2011     Past Surgical History:  Procedure Laterality Date  . COLONOSCOPY    . FASCIOTOMY Right 10/2019  . SHOULDER ARTHROSCOPY WITH ROTATOR CUFF REPAIR Right 03/01/2019   Procedure: Right shoulder arthroscopy, subacromial decompression, distal clavicle resection, rotator cuff repair;  Surgeon: Justice Britain, MD;  Location: [facility];  Service: Orthopedics;  Laterality: Right;  . THYROIDECTOMY     1987       Family History  Problem Relation Age of Onset  . Cancer Mother   . Anxiety disorder Mother   . Diabetes Father   . Hyperlipidemia Father   . Hypertension Father   . Cancer Maternal Grandmother   . Cancer Paternal Grandmother   . Heart disease Paternal Grandfather   . Anxiety disorder Daughter   . Colon cancer Neg Hx   . Esophageal cancer Neg Hx   . Stomach cancer Neg Hx   . Rectal cancer Neg Hx     Social History   Tobacco Use  . Smoking status: Former Smoker    Packs/day: 0.25    Years: 4.00    Pack years: 1.00    Types: Cigarettes    Quit date: 11/17/2018    Years since quitting: 2.0  . Smokeless tobacco: Never Used  . Tobacco comment: 4 per day  Vaping Use  . Vaping Use: Never used  Substance Use Topics  . Alcohol use: Not Currently    Comment: recovery ETOH - last drink 2/5  . Drug  use: Not Currently    Home Medications Prior to Admission medications   Medication Sig Start Date End Date Taking? Authorizing Provider  b complex vitamins tablet Take 1 tablet by mouth daily.    [provider]  buPROPion (WELLBUTRIN XL) 300 MG 24 hr tablet Take 300 mg by mouth daily.    [provider]  carboxymethylcellulose (REFRESH PLUS) 0.5 % SOLN Place 1 drop into both eyes 2 (two) times daily as needed (dry eyes).     [provider]  Coenzyme Q10 (COQ10) 100 MG CAPS Take 100 mg by mouth daily.     [provider]  gabapentin (NEURONTIN) 600 MG tablet Take by mouth. 07/10/20   [provider]  levothyroxine (SYNTHROID, LEVOTHROID)  200 MCG tablet Take 200 mcg by mouth See admin instructions. Take 1 tablet (200 mcg) by mouth daily, except on Sundays (6 days a week) 07/20/17   [provider]  LORazepam (ATIVAN) 1 MG tablet Take 1 mg by mouth 2 (two) times daily. 07/16/20   [provider]  methocarbamol (ROBAXIN) 750 MG tablet Take 750 mg by mouth 2 (two) times daily. 07/02/20   [provider]  milk thistle 175 MG tablet Take 175 mg by mouth daily.    [provider]  Multiple Vitamin (MULTIVITAMIN WITH MINERALS) TABS tablet Take 1 tablet by mouth daily. Men's 50+    [provider]  Omega-3 Fatty Acids (FISH OIL PO) Take 1 capsule by mouth daily.    [provider]  omeprazole (PRILOSEC) 40 MG capsule TAKE ONE CAPSULE BY MOUTH TWO TIMES A DAY 09/19/20   Thornton Park, MD  oxyCODONE (OXY IR/ROXICODONE) 5 MG immediate release tablet Take 5 mg by mouth 4 (four) times daily as needed. 07/08/20   [provider]  propranolol (INDERAL) 10 MG tablet Take 10 mg by mouth as needed.     [provider]  thiamine (VITAMIN B-1) 100 MG tablet Take 100 mg by mouth daily.    [provider]  traZODone (DESYREL) 50 MG tablet Take 50 mg by mouth at bedtime.  12/05/17   [provider]    Allergies    Sulfa antibiotics  Review of Systems   Review of Systems  Constitutional: Negative for fever.  HENT: Negative for sore throat.   Eyes: Negative for visual disturbance.  Respiratory: Negative for cough and shortness of breath.   Cardiovascular: Negative for chest pain.  Gastrointestinal: Positive for abdominal pain, nausea and vomiting. Negative for constipation, diarrhea, hematemesis and hematochezia.  Genitourinary: Negative for dysuria and hematuria.  Musculoskeletal: Negative for neck pain.  Skin: Negative for rash.  Neurological: Negative for headaches.    Physical Exam Updated Vital Signs BP (!) 148/91 (BP Location: Right Arm)   Pulse (!)  57   Temp 98.5 F (36.9 C) (Oral)   Resp 18   Ht 6\' 2"  (1.88 m)   Wt 93.9 kg   SpO2 100%   BMI 26.58 kg/m   Physical Exam Vitals and nursing note reviewed.  Constitutional:      Appearance: Normal appearance. He is well-developed.  HENT:     Head: Normocephalic and atraumatic.  Eyes:     Conjunctiva/sclera: Conjunctivae normal.  Cardiovascular:     Rate and Rhythm: Normal rate and regular rhythm.     Heart sounds: No murmur heard.   Pulmonary:     Effort: Pulmonary effort is normal. No respiratory distress.     Breath sounds: Normal breath  sounds.  Abdominal:     Palpations: Abdomen is soft.     Tenderness: There is generalized abdominal tenderness and tenderness in the left lower quadrant. There is no guarding or rebound.     Hernia: No hernia is present.  Musculoskeletal:        General: No deformity or signs of injury. Normal range of motion.     Cervical back: Neck supple.  Skin:    General: Skin is warm and dry.  Neurological:     General: No focal deficit present.     Mental Status: He is alert.     ED Results / Procedures / Treatments   Labs (all labs ordered are listed, but only abnormal results are displayed) Labs Reviewed  CBC WITH DIFFERENTIAL/PLATELET - Abnormal; Notable for the following components:      Result Value   WBC 14.1 (*)    MCH 34.2 (*)    Neutro Abs 11.4 (*)    All other components within normal limits  COMPREHENSIVE METABOLIC PANEL - Abnormal; Notable for the following components:   Glucose, Bld 147 (*)    All other components within normal limits  RESP PANEL BY RT-PCR (FLU A&B, COVID) ARPGX2  LIPASE, BLOOD    EKG EKG Interpretation  Date/Time:  Saturday December 13 2020 16:53:34 EDT Ventricular Rate:  59 PR Interval:  146 QRS Duration: 95 QT Interval:  437 QTC Calculation: 433 R Axis:   47 Text Interpretation: Sinus rhythm Minimal ST elevation, inferior leads 12 Lead; [place]-L[place]ar No significant change since prior 6/20  Confirmed by Aletta Edouard [phone]) on 12/13/2020 4:56:10 PM   Radiology CT Abdomen Pelvis W Contrast  Result Date: 12/13/2020 CLINICAL DATA:  Nonlocalized acute abdominal pain. EXAM: CT ABDOMEN AND PELVIS WITH CONTRAST TECHNIQUE: Multidetector CT imaging of the abdomen and pelvis was performed using the standard protocol following bolus administration of intravenous contrast. CONTRAST:  133mL OMNIPAQUE IOHEXOL 300 MG/ML  SOLN COMPARISON:  CT angio chest abdomen pelvis 09/25/2019 FINDINGS: Lower chest: No acute abnormality. Hepatobiliary: Redemonstration of fluid density lesion within the left hepatic lobe that likely represents a simple hepatic cyst (2:23). Subcentimeter hypodensity is too small to characterize. No focal liver abnormality. No gallstones, gallbladder wall thickening, or pericholecystic fluid. No biliary dilatation. Pancreas: No focal lesion. Normal pancreatic contour. No surrounding inflammatory changes. No main pancreatic ductal dilatation. Spleen: Normal in size without focal abnormality. Adrenals/Urinary Tract: No adrenal nodule bilaterally. Bilateral kidneys enhance symmetrically. No hydronephrosis. No hydroureter. The urinary bladder is unremarkable. On delayed imaging, there is no urothelial wall thickening and there are no filling defects in the opacified portions of the bilateral collecting systems or ureters. Stomach/Bowel: PO contrast administered and noted to layer within some loops of the small bowel. Stomach is within normal limits. Several loops of left upper abdomen small bowel are dilated with fluid. Associated mesenteric edema is noted. Likely associated closed loop obstruction with transition points best seen on coronal imaging (4:76). No evidence of large bowel wall thickening or dilatation. Appendix appears normal. Vascular/Lymphatic: No abdominal aorta or iliac aneurysm. Mild atherosclerotic plaque of the aorta and its branches. No abdominal, pelvic, or inguinal  lymphadenopathy. Reproductive: Prominent prostate. Other: Trace simple free fluid. No intraperitoneal free gas. No organized fluid collection. Musculoskeletal: No abdominal wall hernia or abnormality. No suspicious lytic or blastic osseous lesions. No acute displaced fracture. IMPRESSION: 1. Findings consistent with vascular compromise of the small bowel and concern for ischemia. Possible closed loop obstruction versus internal hernia. Recommend emergent  surgical consultation. 2. Trace simple free fluid ascites. These results were called by telephone at the time of interpretation on 12/13/2020 at 9:15 pm to provider [facility] , who verbally acknowledged these results. Electronically Signed   By: Iven Finn M.D.   On: 12/13/2020 21:19    Procedures .Critical Care Performed by: Hayden Rasmussen, MD Authorized by: Hayden Rasmussen, MD   Critical care provider statement:    Critical care time (minutes):  45   Critical care time was exclusive of:  Separately billable procedures and treating other patients   Critical care was necessary to treat or prevent imminent or life-threatening deterioration of the following conditions: acute abdomen.   Critical care was time spent personally by me on the following activities:  Discussions with consultants, evaluation of patient's response to treatment, examination of patient, ordering and performing treatments and interventions, ordering and review of laboratory studies, ordering and review of radiographic studies, pulse oximetry, re-evaluation of patient's condition, obtaining history from patient or surrogate, review of old charts and development of treatment plan with patient or surrogate     Medications Ordered in ED Medications  buPROPion (WELLBUTRIN XL) 24 hr tablet 300 mg (has no administration in time range)  gabapentin (NEURONTIN) capsule 600 mg (has no administration in time range)  levothyroxine (SYNTHROID) tablet 200 mcg (has no  administration in time range)  LORazepam (ATIVAN) tablet 1 mg (has no administration in time range)  methocarbamol (ROBAXIN) tablet 750 mg (has no administration in time range)  pantoprazole (PROTONIX) EC tablet 80 mg (has no administration in time range)  oxyCODONE (Oxy IR/ROXICODONE) immediate release tablet 5 mg (has no administration in time range)  traZODone (DESYREL) tablet 50 mg (has no administration in time range)  enoxaparin (LOVENOX) injection 40 mg (has no administration in time range)  dextrose 5 % and 0.45 % NaCl with KCl 20 mEq/L infusion ( Intravenous Infusion Verify 12/14/20 0900)  morphine 2 MG/ML injection 2 mg (2 mg Intravenous Given 12/14/20 0807)  diphenhydrAMINE (BENADRYL) 12.5 MG/5ML elixir 12.5 mg (has no administration in time range)    Or  diphenhydrAMINE (BENADRYL) injection 12.5 mg (has no administration in time range)  ondansetron (ZOFRAN-ODT) disintegrating tablet 4 mg (has no administration in time range)    Or  ondansetron (ZOFRAN) injection 4 mg (has no administration in time range)  simethicone (MYLICON) chewable tablet 40 mg (has no administration in time range)  fentaNYL (SUBLIMAZE) 100 MCG/2ML injection (has no administration in time range)  fentaNYL (SUBLIMAZE) 100 MCG/2ML injection (has no administration in time range)  lip balm (CARMEX) ointment (has no administration in time range)  morphine 4 MG/ML injection 4 mg (4 mg Intravenous Given 12/13/20 2029)  ondansetron (ZOFRAN) injection 4 mg (4 mg Intravenous Given 12/13/20 2030)  sodium chloride 0.9 % bolus 1,000 mL (0 mLs Intravenous Stopped 12/13/20 2232)  iohexol (OMNIPAQUE) 300 MG/ML solution 100 mL (100 mLs Intravenous Contrast Given 12/13/20 2034)  HYDROmorphone (DILAUDID) injection 1 mg (1 mg Intravenous Given 12/13/20 2103)  piperacillin-tazobactam (ZOSYN) IVPB 3.375 g (0 g Intravenous Stopped 12/13/20 2232)  HYDROmorphone (DILAUDID) injection 1 mg (1 mg Intravenous Given 12/13/20 2236)    ED Course   I have reviewed the triage vital signs and the nursing notes.  Pertinent labs & imaging results that were available during my care of the patient were reviewed by me and considered in my medical decision making (see chart for details).  Clinical Course as of 12/14/20 0934  Sat Dec 13, 2020  2120 Received a call from radiology that they are concerned he has a closed-loop obstruction.  I have ordered antibiotics maintenance fluid COVID swab and consult to general surgery Dr. Marcello Moores will evaluate the patient in the ED. [MB]  2204 Patient was seen by Dr. Marcello Moores patient, taken to the operating room for an expiration. [MB]    Clinical Course User Index [MB] Hayden Rasmussen, MD   MDM Rules/Calculators/A&P                         This patient complains of acute abdominal pain nausea vomiting; this involves an extensive number of treatment Options and is a complaint that carries with it a high risk of complications and Morbidity. The differential includes obstruction, perforation, peptic ulcer disease, diverticulitis, colitis  I ordered, reviewed and interpreted labs, which included CBC with elevated white count, normal hemoglobin, chemistries normal elevated glucose likely reactive, normal LFT's, COVID testing negative I ordered medication IV fluids IV pain medication, IV nausea medication, IV antibiotic X I ordered imaging studies which included CT abdomen and pelvis and I independently    visualized and interpreted imaging which showed possible closed-loop obstruction Previous records obtained and reviewed in epic, no recent admissions I consulted Dr Marcello Moores general surgery and discussed lab and imaging findings  Critical Interventions: Work-up and management of patient's acute ischemic bowel with aggressive antibiotics and IV fluids  After the interventions stated above, I reevaluated the patient and found patient to be symptomatically improved.  He is going to the operating room for  definitive care.   Final Clinical Impression(s) / ED Diagnoses Final diagnoses:  Generalized abdominal pain  Intestinal obstruction, unspecified cause, unspecified whether partial or complete [facility])    Rx / DC Orders ED Discharge Orders    None       Hayden Rasmussen, MD 12/14/20 [phone]

## 2020-12-14 MED ORDER — FENTANYL CITRATE (PF) 100 MCG/2ML IJ SOLN: 25.0000 ug | INTRAMUSCULAR | Status: DC | PRN

## 2020-12-14 MED ORDER — KCL IN DEXTROSE-NACL 20-5-0.45 MEQ/L-%-% IV SOLN
INTRAVENOUS | Status: DC
  Filled 2020-12-14 (×6): qty 1000

## 2020-12-14 MED ORDER — ONDANSETRON HCL 4 MG/2ML IJ SOLN: 4.0000 mg | Freq: Four times a day (QID) | INTRAMUSCULAR | Status: DC | PRN

## 2020-12-14 MED ORDER — PANTOPRAZOLE SODIUM 40 MG PO TBEC
80.0000 mg | DELAYED_RELEASE_TABLET | Freq: Every day | ORAL | Status: DC
  Administered 2020-12-14 – 2020-12-15 (×2): 80 mg via ORAL
  Filled 2020-12-14 (×2): qty 2

## 2020-12-14 MED ORDER — SIMETHICONE 80 MG PO CHEW
40.0000 mg | CHEWABLE_TABLET | Freq: Four times a day (QID) | ORAL | Status: DC | PRN
  Administered 2020-12-15: 40 mg via ORAL
  Filled 2020-12-14: qty 1

## 2020-12-14 MED ORDER — MEPERIDINE HCL 50 MG/ML IJ SOLN: 6.2500 mg | INTRAMUSCULAR | Status: DC | PRN

## 2020-12-14 MED ORDER — ACETAMINOPHEN 325 MG PO TABS: 325.0000 mg | ORAL_TABLET | ORAL | Status: DC | PRN

## 2020-12-14 MED ORDER — PHENOL 1.4 % MT LIQD
1.0000 | OROMUCOSAL | Status: DC | PRN
  Filled 2020-12-14 (×2): qty 177

## 2020-12-14 MED ORDER — BUPROPION HCL ER (XL) 300 MG PO TB24
300.0000 mg | ORAL_TABLET | Freq: Every day | ORAL | Status: DC
  Administered 2020-12-15 – 2020-12-18 (×4): 300 mg via ORAL
  Filled 2020-12-14 (×5): qty 1

## 2020-12-14 MED ORDER — MORPHINE SULFATE (PF) 2 MG/ML IV SOLN
2.0000 mg | INTRAVENOUS | Status: DC | PRN
  Administered 2020-12-14 – 2020-12-15 (×7): 2 mg via INTRAVENOUS
  Filled 2020-12-14 (×7): qty 1

## 2020-12-14 MED ORDER — OXYCODONE HCL 5 MG PO TABS
5.0000 mg | ORAL_TABLET | Freq: Four times a day (QID) | ORAL | Status: DC | PRN
  Administered 2020-12-14 – 2020-12-15 (×5): 5 mg via ORAL
  Filled 2020-12-14 (×6): qty 1

## 2020-12-14 MED ORDER — ONDANSETRON 4 MG PO TBDP: 4.0000 mg | ORAL_TABLET | Freq: Four times a day (QID) | ORAL | Status: DC | PRN

## 2020-12-14 MED ORDER — LORAZEPAM 1 MG PO TABS
1.0000 mg | ORAL_TABLET | Freq: Two times a day (BID) | ORAL | Status: DC
  Administered 2020-12-14 – 2020-12-15 (×3): 1 mg via ORAL
  Filled 2020-12-14 (×3): qty 1

## 2020-12-14 MED ORDER — FENTANYL CITRATE (PF) 100 MCG/2ML IJ SOLN
INTRAMUSCULAR | Status: AC
  Filled 2020-12-14: qty 2

## 2020-12-14 MED ORDER — DIPHENHYDRAMINE HCL 12.5 MG/5ML PO ELIX
12.5000 mg | ORAL_SOLUTION | Freq: Four times a day (QID) | ORAL | Status: DC | PRN
  Filled 2020-12-14: qty 5

## 2020-12-14 MED ORDER — OXYCODONE HCL 5 MG PO TABS: 5.0000 mg | ORAL_TABLET | Freq: Once | ORAL | Status: DC | PRN

## 2020-12-14 MED ORDER — OXYCODONE HCL 5 MG/5ML PO SOLN: 5.0000 mg | Freq: Once | ORAL | Status: DC | PRN

## 2020-12-14 MED ORDER — ONDANSETRON HCL 4 MG/2ML IJ SOLN: 4.0000 mg | Freq: Once | INTRAMUSCULAR | Status: DC | PRN

## 2020-12-14 MED ORDER — TRAZODONE HCL 50 MG PO TABS
50.0000 mg | ORAL_TABLET | Freq: Every day | ORAL | Status: DC
  Administered 2020-12-14 – 2020-12-17 (×3): 50 mg via ORAL
  Filled 2020-12-14 (×3): qty 1

## 2020-12-14 MED ORDER — ENOXAPARIN SODIUM 40 MG/0.4ML ~~LOC~~ SOLN
40.0000 mg | SUBCUTANEOUS | Status: DC
  Administered 2020-12-14 – 2020-12-17 (×4): 40 mg via SUBCUTANEOUS
  Filled 2020-12-14 (×4): qty 0.4

## 2020-12-14 MED ORDER — LEVOTHYROXINE SODIUM 200 MCG PO TABS
200.0000 ug | ORAL_TABLET | ORAL | Status: DC
  Administered 2020-12-15 – 2020-12-18 (×3): 200 ug via ORAL
  Filled 2020-12-14: qty 1
  Filled 2020-12-14: qty 2
  Filled 2020-12-14 (×2): qty 1
  Filled 2020-12-14: qty 2
  Filled 2020-12-14: qty 1
  Filled 2020-12-14: qty 2

## 2020-12-14 MED ORDER — GABAPENTIN 300 MG PO CAPS
600.0000 mg | ORAL_CAPSULE | Freq: Two times a day (BID) | ORAL | Status: DC
  Administered 2020-12-14 – 2020-12-15 (×3): 600 mg via ORAL
  Filled 2020-12-14 (×3): qty 2

## 2020-12-14 MED ORDER — LIP MEDEX EX OINT
TOPICAL_OINTMENT | CUTANEOUS | Status: AC
  Filled 2020-12-14: qty 7

## 2020-12-14 MED ORDER — METHOCARBAMOL 500 MG PO TABS
750.0000 mg | ORAL_TABLET | Freq: Two times a day (BID) | ORAL | Status: DC
  Administered 2020-12-14 – 2020-12-15 (×3): 750 mg via ORAL
  Filled 2020-12-14 (×3): qty 2

## 2020-12-14 MED ORDER — ACETAMINOPHEN 160 MG/5ML PO SOLN: 325.0000 mg | ORAL | Status: DC | PRN

## 2020-12-14 MED ORDER — DIPHENHYDRAMINE HCL 50 MG/ML IJ SOLN: 12.5000 mg | Freq: Four times a day (QID) | INTRAMUSCULAR | Status: DC | PRN

## 2020-12-14 NOTE — Progress Notes (Signed)
1 Day Post-Op ex lap, loa Subjective: Still having some pain but better.  No flatus or BM yet  Objective: Vital signs in last 24 hours: Temp:  [98.3 F (36.8 C)-98.7 F (37.1 C)] 98.6 F (37 C) (04/17 0745) Pulse Rate:  [46-75] 75 (04/17 0745) Resp:  [11-18] 18 (04/17 0745) BP: (122-179)/(79-120) 160/94 (04/17 0745) SpO2:  [96 %-100 %] 98 % (04/17 0745) Weight:  [93.9 kg] 93.9 kg (04/16 1641)   Intake/Output from previous day: 04/16 0701 - 04/17 0700 In: 1260 [P.O.:60; I.V.:1200] Out: 535 [Urine:175; Emesis/NG output:350; Blood:10] Intake/Output this shift: No intake/output data recorded.   General appearance: alert and cooperative GI: normal findings: soft, appropriately tender  Incision: slight bloody drainage  Lab Results:  Recent Labs    12/13/20 1831  WBC 14.1*  HGB 16.5  HCT 47.0  PLT 372   BMET Recent Labs    12/13/20 1831  NA 140  K 4.3  CL 101  CO2 27  GLUCOSE 147*  BUN 14  CREATININE 1.00  CALCIUM 10.2   PT/INR No results for input(s): LABPROT, INR in the last 72 hours. ABG No results for input(s): PHART, HCO3 in the last 72 hours.  Invalid input(s): PCO2, PO2  MEDS, Scheduled . buPROPion  300 mg Oral Daily  . enoxaparin (LOVENOX) injection  40 mg Subcutaneous Q24H  . fentaNYL      . fentaNYL      . gabapentin  600 mg Oral BID  . [START ON 12/15/2020] levothyroxine  200 mcg Oral Once per day on Mon Tue Wed Thu Fri Sat  . lip balm      . LORazepam  1 mg Oral BID  . methocarbamol  750 mg Oral BID  . pantoprazole  80 mg Oral Daily  . traZODone  50 mg Oral QHS    Studies/Results: CT Abdomen Pelvis W Contrast  Result Date: 12/13/2020 CLINICAL DATA:  Nonlocalized acute abdominal pain. EXAM: CT ABDOMEN AND PELVIS WITH CONTRAST TECHNIQUE: Multidetector CT imaging of the abdomen and pelvis was performed using the standard protocol following bolus administration of intravenous contrast. CONTRAST:  116mL OMNIPAQUE IOHEXOL 300 MG/ML  SOLN  COMPARISON:  CT angio chest abdomen pelvis 09/25/2019 FINDINGS: Lower chest: No acute abnormality. Hepatobiliary: Redemonstration of fluid density lesion within the left hepatic lobe that likely represents a simple hepatic cyst (2:23). Subcentimeter hypodensity is too small to characterize. No focal liver abnormality. No gallstones, gallbladder wall thickening, or pericholecystic fluid. No biliary dilatation. Pancreas: No focal lesion. Normal pancreatic contour. No surrounding inflammatory changes. No main pancreatic ductal dilatation. Spleen: Normal in size without focal abnormality. Adrenals/Urinary Tract: No adrenal nodule bilaterally. Bilateral kidneys enhance symmetrically. No hydronephrosis. No hydroureter. The urinary bladder is unremarkable. On delayed imaging, there is no urothelial wall thickening and there are no filling defects in the opacified portions of the bilateral collecting systems or ureters. Stomach/Bowel: PO contrast administered and noted to layer within some loops of the small bowel. Stomach is within normal limits. Several loops of left upper abdomen small bowel are dilated with fluid. Associated mesenteric edema is noted. Likely associated closed loop obstruction with transition points best seen on coronal imaging (4:76). No evidence of large bowel wall thickening or dilatation. Appendix appears normal. Vascular/Lymphatic: No abdominal aorta or iliac aneurysm. Mild atherosclerotic plaque of the aorta and its branches. No abdominal, pelvic, or inguinal lymphadenopathy. Reproductive: Prominent prostate. Other: Trace simple free fluid. No intraperitoneal free gas. No organized fluid collection. Musculoskeletal: No abdominal wall hernia  or abnormality. No suspicious lytic or blastic osseous lesions. No acute displaced fracture. IMPRESSION: 1. Findings consistent with vascular compromise of the small bowel and concern for ischemia. Possible closed loop obstruction versus internal hernia.  Recommend emergent surgical consultation. 2. Trace simple free fluid ascites. These results were called by telephone at the time of interpretation on 12/13/2020 at 9:15 pm to provider [facility] , who verbally acknowledged these results. Electronically Signed   By: Iven Finn M.D.   On: 12/13/2020 21:19    Assessment: s/p Procedure(s): EXPLORATORY LAPAROTOMY, LYSIS OF ADHESIONS Patient Active Problem List   Diagnosis Date Noted  . SBO (small bowel obstruction) ([place]) 12/13/2020  . Pain of right shoulder joint on movement 03/12/2019  . Dupuytren contracture 12/23/2017  . Alcohol use disorder, severe, dependence ([place]) 12/09/2017  . Hypertension 10/28/2017  . GERD (gastroesophageal reflux disease) 10/28/2017  . Alcohol abuse 10/28/2017  . GAD (generalized anxiety disorder) 10/28/2017  . PUD (peptic ulcer disease) 10/01/2011  . Thyroid cancer ([place]) 03/31/2011    Expected post op coure  Plan: keep NG until bowel function returns  Ambulate in hall   LOS: 1 day     .Rosario Adie, MD [facility], [place]    12/14/2020 8:39 AM

## 2020-12-14 NOTE — Anesthesia Postprocedure Evaluation (Signed)
Anesthesia Post Note  Patient: Vernon Fowler  Procedure(s) Performed: EXPLORATORY LAPAROTOMY, LYSIS OF ADHESIONS (N/A )     Patient location during evaluation: PACU Anesthesia Type: General Level of consciousness: awake and alert Pain management: pain level controlled Vital Signs Assessment: post-procedure vital signs reviewed and stable Respiratory status: spontaneous breathing, nonlabored ventilation, respiratory function stable and patient connected to nasal cannula oxygen Cardiovascular status: blood pressure returned to baseline and stable Postop Assessment: no apparent nausea or vomiting Anesthetic complications: no   No complications documented.  Last Vitals:  Vitals:   12/14/20 0600 12/14/20 0745  BP: (!) 169/93 (!) 160/94  Pulse: 74 75  Resp: 18 18  Temp: 36.9 C 37 C  SpO2: 98% 98%    Last Pain:  Vitals:   12/14/20 0900  TempSrc:   PainSc: 0-No pain                 Lavel Rieman

## 2020-12-14 NOTE — Transfer of Care (Signed)
Immediate Anesthesia Transfer of Care Note  Patient: Vernon Fowler  Procedure(s) Performed: EXPLORATORY LAPAROTOMY, LYSIS OF ADHESIONS (N/A )  Patient Location: PACU  Anesthesia Type:General  Level of Consciousness: awake, alert  and oriented  Airway & Oxygen Therapy: Patient Spontanous Breathing and Patient connected to face mask  Post-op Assessment: Report given to RN and Post -op Vital signs reviewed and stable  Post vital signs: Reviewed and stable  Last Vitals:  Vitals Value Taken Time  BP 148/89 12/14/20 0005  Temp    Pulse 63 12/14/20 0012  Resp 16 12/14/20 0012  SpO2 99 % 12/14/20 0012  Vitals shown include unvalidated device data.  Last Pain:  Vitals:   12/13/20 2103  TempSrc:   PainSc: 7          Complications: No complications documented.

## 2020-12-14 NOTE — Progress Notes (Addendum)
Pt requesting pain meds every 1.5 to 2 hrs . Marland Kitchen Pt seems very anxious/restless at times, asking the same questions again and again. Have alternated IV with po pain med. Pt up walking halls numerous times, out of bed constantly. At one point, pt received Gabapentin, Ativan, Robaxin and Oxycodone together.  Instructed that overdoing it w activities would increase his pain and should perhaps only walk 3-4 times a day (has already walked halls 5 times, plus up repeatedly in rm and to BR). Will cont to monitor.

## 2020-12-15 ENCOUNTER — Encounter (HOSPITAL_COMMUNITY): Payer: Self-pay | Admitting: General Surgery

## 2020-12-15 LAB — BASIC METABOLIC PANEL
Anion gap: 10 (ref 5–15)
BUN: 8 mg/dL (ref 8–23)
CO2: 26 mmol/L (ref 22–32)
Calcium: 8.7 mg/dL — ABNORMAL LOW (ref 8.9–10.3)
Chloride: 106 mmol/L (ref 98–111)
Creatinine, Ser: 0.94 mg/dL (ref 0.61–1.24)
GFR, Estimated: >60 mL/min (ref >60)
Glucose, Bld: 126 mg/dL — ABNORMAL HIGH (ref 70–99)
Potassium: 3.2 mmol/L — ABNORMAL LOW (ref 3.5–5.1)
Sodium: 142 mmol/L (ref 135–145)

## 2020-12-15 LAB — CBC
HCT: 40.2 % (ref 39.0–52.0)
Hemoglobin: 13.9 g/dL (ref 13.0–17.0)
MCH: 33.9 pg (ref 26.0–34.0)
MCHC: 34.6 g/dL (ref 30.0–36.0)
MCV: 98 fL (ref 80.0–100.0)
Platelets: 299 10*3/uL (ref 150–400)
RBC: 4.1 MIL/uL — ABNORMAL LOW (ref 4.22–5.81)
RDW: 13 % (ref 11.5–15.5)
WBC: 11.2 10*3/uL — ABNORMAL HIGH (ref 4.0–10.5)
nRBC: 0 % (ref 0.0–0.2)

## 2020-12-15 MED ORDER — MORPHINE SULFATE (PF) 2 MG/ML IV SOLN
2.0000 mg | INTRAVENOUS | Status: DC | PRN
  Administered 2020-12-15 – 2020-12-16 (×5): 2 mg via INTRAVENOUS
  Filled 2020-12-15 (×5): qty 1

## 2020-12-15 MED ORDER — KETOROLAC TROMETHAMINE 15 MG/ML IJ SOLN
15.0000 mg | Freq: Three times a day (TID) | INTRAMUSCULAR | Status: DC
  Administered 2020-12-15 – 2020-12-18 (×9): 15 mg via INTRAVENOUS
  Filled 2020-12-15 (×9): qty 1

## 2020-12-15 MED ORDER — METHOCARBAMOL 1000 MG/10ML IJ SOLN
500.0000 mg | Freq: Four times a day (QID) | INTRAVENOUS | Status: DC
  Administered 2020-12-15 – 2020-12-17 (×6): 500 mg via INTRAVENOUS
  Filled 2020-12-15 (×6): qty 500

## 2020-12-15 MED ORDER — PANTOPRAZOLE SODIUM 40 MG IV SOLR
40.0000 mg | Freq: Two times a day (BID) | INTRAVENOUS | Status: DC
  Administered 2020-12-15 – 2020-12-16 (×3): 40 mg via INTRAVENOUS
  Filled 2020-12-15 (×3): qty 40

## 2020-12-15 MED ORDER — POTASSIUM CHLORIDE 10 MEQ/100ML IV SOLN
10.0000 meq | INTRAVENOUS | Status: AC
  Administered 2020-12-15 (×4): 10 meq via INTRAVENOUS
  Filled 2020-12-15 (×4): qty 100

## 2020-12-15 MED ORDER — LORAZEPAM 2 MG/ML IJ SOLN
1.0000 mg | Freq: Two times a day (BID) | INTRAMUSCULAR | Status: DC | PRN
  Administered 2020-12-15 – 2020-12-17 (×3): 1 mg via INTRAVENOUS
  Filled 2020-12-15 (×3): qty 1

## 2020-12-15 NOTE — Progress Notes (Signed)
2 Days Post-Op  Subjective: No flatus or bowel movements yet. NG output was 1L yesterday, seems to be decreasing today. Ambulating multiple times throughout the day. Continued pain when ambulating.   Objective: Vital signs in last 24 hours: Temp:  [98.1 F (36.7 C)-98.2 F (36.8 C)] 98.2 F (36.8 C) (04/18 0553) Pulse Rate:  [56-67] 56 (04/18 1333) Resp:  [17-18] 17 (04/18 1333) BP: (143-153)/(85-89) 143/89 (04/18 1333) SpO2:  [96 %-98 %] 98 % (04/18 1333) Last BM Date: 12/13/20  Intake/Output from previous day: 04/17 0701 - 04/18 0700 In: 1533.7 [P.O.:360; I.V.:993.7; NG/GT:180] Out: 2600 [Urine:1600; Emesis/NG output:1000] Intake/Output this shift: Total I/O In: 0  Out: 100 [Emesis/NG output:100]  PE: General: resting comfortably, NAD Neuro: alert and oriented, no focal deficits HEENT: NG in place, draining gastric contents Resp: normal work of breathing Abdomen: soft, nondistended, appropriately tender. Midline incision clean and in tact, honeycomb dressing in place. Extremities: warm and well-perfused   Lab Results:  Recent Labs    12/13/20 1831 12/15/20 0359  WBC 14.1* 11.2*  HGB 16.5 13.9  HCT 47.0 40.2  PLT 372 299   BMET Recent Labs    12/13/20 1831 12/15/20 0359  NA 140 142  K 4.3 3.2*  CL 101 106  CO2 27 26  GLUCOSE 147* 126*  BUN 14 8  CREATININE 1.00 0.94  CALCIUM 10.2 8.7*   PT/INR No results for input(s): LABPROT, INR in the last 72 hours. CMP     Component Value Date/Time   NA 142 12/15/2020 0359   K 3.2 (L) 12/15/2020 0359   CL 106 12/15/2020 0359   CO2 26 12/15/2020 0359   GLUCOSE 126 (H) 12/15/2020 0359   BUN 8 12/15/2020 0359   CREATININE 0.94 12/15/2020 0359   CALCIUM 8.7 (L) 12/15/2020 0359   PROT 8.1 12/13/2020 1831   ALBUMIN 5.0 12/13/2020 1831   AST 26 12/13/2020 1831   ALT 34 12/13/2020 1831   ALKPHOS 56 12/13/2020 1831   BILITOT 1.0 12/13/2020 1831   GFRNONAA >60 12/15/2020 0359   GFRAA >60 02/26/2019 1205    Lipase     Component Value Date/Time   LIPASE 30 12/13/2020 1831       Studies/Results: CT Abdomen Pelvis W Contrast  Result Date: 12/13/2020 CLINICAL DATA:  Nonlocalized acute abdominal pain. EXAM: CT ABDOMEN AND PELVIS WITH CONTRAST TECHNIQUE: Multidetector CT imaging of the abdomen and pelvis was performed using the standard protocol following bolus administration of intravenous contrast. CONTRAST:  153mL OMNIPAQUE IOHEXOL 300 MG/ML  SOLN COMPARISON:  CT angio chest abdomen pelvis 09/25/2019 FINDINGS: Lower chest: No acute abnormality. Hepatobiliary: Redemonstration of fluid density lesion within the left hepatic lobe that likely represents a simple hepatic cyst (2:23). Subcentimeter hypodensity is too small to characterize. No focal liver abnormality. No gallstones, gallbladder wall thickening, or pericholecystic fluid. No biliary dilatation. Pancreas: No focal lesion. Normal pancreatic contour. No surrounding inflammatory changes. No main pancreatic ductal dilatation. Spleen: Normal in size without focal abnormality. Adrenals/Urinary Tract: No adrenal nodule bilaterally. Bilateral kidneys enhance symmetrically. No hydronephrosis. No hydroureter. The urinary bladder is unremarkable. On delayed imaging, there is no urothelial wall thickening and there are no filling defects in the opacified portions of the bilateral collecting systems or ureters. Stomach/Bowel: PO contrast administered and noted to layer within some loops of the small bowel. Stomach is within normal limits. Several loops of left upper abdomen small bowel are dilated with fluid. Associated mesenteric edema is noted. Likely associated closed loop  obstruction with transition points best seen on coronal imaging (4:76). No evidence of large bowel wall thickening or dilatation. Appendix appears normal. Vascular/Lymphatic: No abdominal aorta or iliac aneurysm. Mild atherosclerotic plaque of the aorta and its branches. No abdominal,  pelvic, or inguinal lymphadenopathy. Reproductive: Prominent prostate. Other: Trace simple free fluid. No intraperitoneal free gas. No organized fluid collection. Musculoskeletal: No abdominal wall hernia or abnormality. No suspicious lytic or blastic osseous lesions. No acute displaced fracture. IMPRESSION: 1. Findings consistent with vascular compromise of the small bowel and concern for ischemia. Possible closed loop obstruction versus internal hernia. Recommend emergent surgical consultation. 2. Trace simple free fluid ascites. These results were called by telephone at the time of interpretation on 12/13/2020 at 9:15 pm to provider [facility] , who verbally acknowledged these results. Electronically Signed   By: Iven Finn M.D.   On: 12/13/2020 21:19      Assessment/Plan 65 yo male with closed loop bowel obstruction, POD2 s/p exploratory laparotomy and lysis of adhesions. - NPO, maintenance IV fluids, K repleted - Continue NG tube, output does seem to be clearing. Awaiting return of bowel function. - Multimodal pain control, add on scheduled IV toradol and robaxin. Continue prn morphine. - Pulmonary toilet, ambulate - VTE: lovenox, SCDs - Dispo: inpatient, med-surg   LOS: 2 days    Michaelle Birks, MD [facility] Surgery General, Hepatobiliary and Pancreatic Surgery 12/15/20 2:07 PM

## 2020-12-16 ENCOUNTER — Telehealth: Payer: Self-pay | Admitting: Gastroenterology

## 2020-12-16 LAB — BASIC METABOLIC PANEL
Anion gap: 8 (ref 5–15)
BUN: 7 mg/dL — ABNORMAL LOW (ref 8–23)
CO2: 26 mmol/L (ref 22–32)
Calcium: 9 mg/dL (ref 8.9–10.3)
Chloride: 105 mmol/L (ref 98–111)
Creatinine, Ser: 0.96 mg/dL (ref 0.61–1.24)
GFR, Estimated: >60 mL/min (ref >60)
Glucose, Bld: 121 mg/dL — ABNORMAL HIGH (ref 70–99)
Potassium: 3.5 mmol/L (ref 3.5–5.1)
Sodium: 139 mmol/L (ref 135–145)

## 2020-12-16 LAB — MAGNESIUM: Magnesium: 1.8 mg/dL (ref 1.7–2.4)

## 2020-12-16 MED ORDER — OXYCODONE HCL 5 MG PO TABS
5.0000 mg | ORAL_TABLET | ORAL | Status: DC | PRN
  Administered 2020-12-16: 10 mg via ORAL
  Administered 2020-12-16: 5 mg via ORAL
  Administered 2020-12-16 – 2020-12-18 (×5): 10 mg via ORAL
  Administered 2020-12-18: 5 mg via ORAL
  Filled 2020-12-16 (×8): qty 2

## 2020-12-16 MED ORDER — ACETAMINOPHEN 500 MG PO TABS
1000.0000 mg | ORAL_TABLET | Freq: Four times a day (QID) | ORAL | Status: DC
  Administered 2020-12-16 – 2020-12-18 (×7): 1000 mg via ORAL
  Filled 2020-12-16 (×9): qty 2

## 2020-12-16 MED ORDER — POTASSIUM CHLORIDE CRYS ER 20 MEQ PO TBCR
40.0000 meq | EXTENDED_RELEASE_TABLET | Freq: Once | ORAL | Status: AC
  Administered 2020-12-16: 40 meq via ORAL
  Filled 2020-12-16: qty 2

## 2020-12-16 MED ORDER — MAGNESIUM OXIDE 400 (241.3 MG) MG PO TABS
400.0000 mg | ORAL_TABLET | Freq: Two times a day (BID) | ORAL | Status: AC
  Administered 2020-12-16 (×2): 400 mg via ORAL
  Filled 2020-12-16 (×2): qty 1

## 2020-12-16 MED ORDER — MORPHINE SULFATE (PF) 2 MG/ML IV SOLN
2.0000 mg | INTRAVENOUS | Status: DC | PRN
Start: 2020-12-16 — End: 2020-12-18
  Administered 2020-12-16: 2 mg via INTRAVENOUS
  Filled 2020-12-16: qty 1

## 2020-12-16 NOTE — Progress Notes (Signed)
    3 Days Post-Op  Subjective: Pain controlled. Reports he is now having flatus. Denies BM. Has already been up walking with AM with NG clamped. Ng output down-trending.   Objective: Vital signs in last 24 hours: Temp:  [97.7 F (36.5 C)-97.9 F (36.6 C)] 97.7 F (36.5 C) (04/19 0518) Pulse Rate:  [53-56] 53 (04/19 0518) Resp:  [14-17] 16 (04/19 0518) BP: (143-163)/(89-91) 150/91 (04/19 0518) SpO2:  [97 %-98 %] 97 % (04/19 0518) Last BM Date: 12/13/20  Intake/Output from previous day: 04/18 0701 - 04/19 0700 In: 1784.1 [I.V.:1684.1; IV Piggyback:100] Out: 1210 [Urine:900; Emesis/NG output:310] Intake/Output this shift: No intake/output data recorded.  PE: General: resting comfortably, NAD Neuro: alert and oriented, no focal deficits HEENT: NG in place, draining gastric contents Resp: normal work of breathing Abdomen: soft, nondistended, appropriately tender. Midline incision clean and in tact, honeycomb dressing in place. NGT in place, clamped, only 300 cc clear output last 24 hours- pink-tinged from chloraseptic spray Extremities: warm and well-perfused   Lab Results:  Recent Labs    12/13/20 1831 12/15/20 0359  WBC 14.1* 11.2*  HGB 16.5 13.9  HCT 47.0 40.2  PLT 372 299   BMET Recent Labs    12/15/20 0359 12/16/20 0450  NA 142 139  K 3.2* 3.5  CL 106 105  CO2 26 26  GLUCOSE 126* 121*  BUN 8 7*  CREATININE 0.94 0.96  CALCIUM 8.7* 9.0   PT/INR No results for input(s): LABPROT, INR in the last 72 hours. CMP     Component Value Date/Time   NA 139 12/16/2020 0450   K 3.5 12/16/2020 0450   CL 105 12/16/2020 0450   CO2 26 12/16/2020 0450   GLUCOSE 121 (H) 12/16/2020 0450   BUN 7 (L) 12/16/2020 0450   CREATININE 0.96 12/16/2020 0450   CALCIUM 9.0 12/16/2020 0450   PROT 8.1 12/13/2020 1831   ALBUMIN 5.0 12/13/2020 1831   AST 26 12/13/2020 1831   ALT 34 12/13/2020 1831   ALKPHOS 56 12/13/2020 1831   BILITOT 1.0 12/13/2020 1831   GFRNONAA >60  12/16/2020 0450   GFRAA >60 02/26/2019 1205   Lipase     Component Value Date/Time   LIPASE 30 12/13/2020 1831       Studies/Results: No results found.   Assessment/Plan 65 yo male with closed loop bowel obstruction, POD3 s/p exploratory laparotomy and lysis of adhesions. - D/C NGT, start CLD and await further bowel function.  - Multimodal pain control, add on PO meds - schedule PO tylenol, PRN oxycodone, continue toardol and robaxin. Continue prn morphine. - Pulmonary toilet, ambulate - FEN: CLD, decrease IVF to 50 mL hr; K 3.5, give another 40 mEq KCl today, Mg 1.8 will also give 400 mg BID mag ox. Goal K > 4.0, Mg > 2.0  - VTE: lovenox, SCDs - Dispo: inpatient, med-surg   LOS: 3 days    Michaelle Birks, MD [facility] Surgery General, Hepatobiliary and Pancreatic Surgery 12/16/20 8:14 AM

## 2020-12-16 NOTE — Telephone Encounter (Signed)
I am sorry to hear that he's in the hospital. I am wishing him a speedy recovery. We should definitely delay his endoscopy until he fully recovers. Thanks for the update.

## 2020-12-16 NOTE — Telephone Encounter (Signed)
Pt aware. He knows to call back when fully recovered from surgery.

## 2020-12-16 NOTE — Telephone Encounter (Signed)
Pt called to inform Dr. Tarri Glenn that he is hospitalized at [facility]. He had emergency surgery this past weekend due to bowel obstruction. He is scheduled for an EGD on 01/05/21 and wanted to make Dr. Tarri Glenn aware in case she wants to r/s procedure.

## 2020-12-17 MED ORDER — METHOCARBAMOL 500 MG PO TABS
500.0000 mg | ORAL_TABLET | Freq: Four times a day (QID) | ORAL | Status: DC | PRN
  Administered 2020-12-17 – 2020-12-18 (×3): 500 mg via ORAL
  Filled 2020-12-17 (×3): qty 1

## 2020-12-17 MED ORDER — PANTOPRAZOLE SODIUM 40 MG PO TBEC
40.0000 mg | DELAYED_RELEASE_TABLET | Freq: Two times a day (BID) | ORAL | Status: DC
  Administered 2020-12-17 – 2020-12-18 (×3): 40 mg via ORAL
  Filled 2020-12-17 (×3): qty 1

## 2020-12-17 NOTE — Progress Notes (Signed)

## 2020-12-17 NOTE — Progress Notes (Addendum)
    4 Days Post-Op  Subjective: Pain controlled on PO meds. States he feels better- walked a lot yesterday and reports a large amt flatus, no BM. Tolerated CLD.    Objective: Vital signs in last 24 hours: Temp:  [98 F (36.7 C)-98.6 F (37 C)] 98 F (36.7 C) (04/20 0509) Pulse Rate:  [41-53] 41 (04/20 0509) Resp:  [15-18] 15 (04/20 0509) BP: (130-158)/(84-98) 144/84 (04/20 0509) SpO2:  [96 %-97 %] 96 % (04/20 0509) Last BM Date: 12/13/20  Intake/Output from previous day: 04/19 0701 - 04/20 0700 In: 1856.4 [P.O.:1200; I.V.:556.4; IV Piggyback:100] Out: 275 [Urine:275] Intake/Output this shift: No intake/output data recorded.  PE: General: resting comfortably, NAD Neuro: alert and oriented, no focal deficits HEENT: NG in place, draining gastric contents Resp: normal work of breathing Abdomen: soft, nondistended, appropriately tender. Honeycomb dressing removed -- subcuticular suture closure. Incision c/d/i without cellulitis or drainage. Extremities: warm and well-perfused   Lab Results:  Recent Labs    12/15/20 0359  WBC 11.2*  HGB 13.9  HCT 40.2  PLT 299   BMET Recent Labs    12/15/20 0359 12/16/20 0450  NA 142 139  K 3.2* 3.5  CL 106 105  CO2 26 26  GLUCOSE 126* 121*  BUN 8 7*  CREATININE 0.94 0.96  CALCIUM 8.7* 9.0   PT/INR No results for input(s): LABPROT, INR in the last 72 hours. CMP     Component Value Date/Time   NA 139 12/16/2020 0450   K 3.5 12/16/2020 0450   CL 105 12/16/2020 0450   CO2 26 12/16/2020 0450   GLUCOSE 121 (H) 12/16/2020 0450   BUN 7 (L) 12/16/2020 0450   CREATININE 0.96 12/16/2020 0450   CALCIUM 9.0 12/16/2020 0450   PROT 8.1 12/13/2020 1831   ALBUMIN 5.0 12/13/2020 1831   AST 26 12/13/2020 1831   ALT 34 12/13/2020 1831   ALKPHOS 56 12/13/2020 1831   BILITOT 1.0 12/13/2020 1831   GFRNONAA >60 12/16/2020 0450   GFRAA >60 02/26/2019 1205   Lipase     Component Value Date/Time   LIPASE 30 12/13/2020 1831        Studies/Results: No results found.   Assessment/Plan 65 yo male with closed loop bowel obstruction, POD3 s/p exploratory laparotomy and lysis of adhesions. - advance to FLD and await BM.  - continue PO multimodal pain control -PO tylenol, PRN PO robaxin, PRN oxycodone, continue toardol  - Pulmonary toilet, ambulate - FEN: FLD, saline lock IV - VTE: lovenox, SCDs - Dispo: inpatient, med-surg  Anticipate discharge when patient has a BM and tolerates a soft diet. I would be comfortable discharging patient home as early as later today depending on his clinical progress.   LOS: 4 days    Obie Dredge, [facility] Surgery General & Trauma Surgery   12/17/20 7:39 AM

## 2020-12-18 MED ORDER — BISACODYL 10 MG RE SUPP
10.0000 mg | Freq: Once | RECTAL | Status: AC
  Administered 2020-12-18: 10 mg via RECTAL
  Filled 2020-12-18: qty 1

## 2020-12-18 MED ORDER — LORAZEPAM 1 MG PO TABS: 1.0000 mg | ORAL_TABLET | Freq: Two times a day (BID) | ORAL | Status: DC | PRN

## 2020-12-18 MED ORDER — OXYCODONE HCL 5 MG PO TABS: 5.0000 mg | ORAL_TABLET | Freq: Four times a day (QID) | ORAL | 0 refills | Status: DC | PRN

## 2020-12-18 MED ORDER — GABAPENTIN 300 MG PO CAPS
600.0000 mg | ORAL_CAPSULE | Freq: Two times a day (BID) | ORAL | Status: DC
  Administered 2020-12-18: 600 mg via ORAL
  Filled 2020-12-18: qty 2

## 2020-12-18 MED ORDER — ACETAMINOPHEN 500 MG PO TABS: 1000.0000 mg | ORAL_TABLET | Freq: Four times a day (QID) | ORAL | 0 refills | Status: DC | PRN

## 2020-12-18 MED ORDER — METHOCARBAMOL 500 MG PO TABS: 750.0000 mg | ORAL_TABLET | Freq: Two times a day (BID) | ORAL | Status: DC

## 2020-12-18 NOTE — Discharge Summary (Signed)
[facility] Discharge Summary   Patient ID: Vernon Fowler MRN: [id number] DOB/AGE: 02/22/1956 65 y.o.  Admit date: 12/13/2020 Discharge date: 12/18/2020  Discharge Diagnosis Patient Active Problem List   Diagnosis Date Noted  . SBO (small bowel obstruction) ([place]) 12/13/2020  . Pain of right shoulder joint on movement 03/12/2019  . Dupuytren contracture 12/23/2017  . Alcohol use disorder, severe, dependence ([place]) 12/09/2017  . Hypertension 10/28/2017  . GERD (gastroesophageal reflux disease) 10/28/2017  . Alcohol abuse 10/28/2017  . GAD (generalized anxiety disorder) 10/28/2017  . PUD (peptic ulcer disease) 10/01/2011  . Thyroid cancer ([place]) 03/31/2011    Consultants None   Procedures Dr. Leighton Ruff 11/12/38 - exploratory laparotomy, Lysis of Adhesions   HPI:  Vernon Fowler is an 65 y.o. male who is here for acute onset of abdominal pain earlier this morning.  Pain is colicky in nature.  Occurs throughout the abdomen.  Associated with nausea.  No fevers chills constipation or diarrhea.  No past surgical history within the abdomen.  Hospital Course:  Workup was concerning for a closed loop bowel obstruction - significant mesenteric edema on CT scan and elevated white count concerning for bowel compromise. Patient was admitted and underwent procedure listed above.  Tolerated procedure well and was transferred to the floor.  Diet was advanced as tolerated.  On POD#5, the patient was having bowel function, voiding well, tolerating diet, ambulating well, pain well controlled, vital signs stable, incisions c/d/i and felt stable for discharge home.  Patient will follow up in our office in as below knows to call with questions or concerns.   I have personally reviewed the patients medication history on the [place] controlled substance database. He is prescribed oxycodone for chronic pain and his last prescription was 11/26/20 - I have prescribed him 15 additional 5 mg tablets of  oxycodone for management of acute post-op pain on chronic pain.   Allergies as of 12/18/2020      Reactions   Sulfa Antibiotics Rash   Fixed based skin reaction      Medication List    TAKE these medications   acetaminophen 500 MG tablet Commonly known as: TYLENOL Take 2 tablets (1,000 mg total) by mouth every 6 (six) hours as needed.   b complex vitamins tablet Take 1 tablet by mouth daily.   carboxymethylcellulose 0.5 % Soln Commonly known as: REFRESH PLUS Place 1 drop into both eyes 2 (two) times daily as needed (dry eyes).   CoQ10 100 MG Caps Take 100 mg by mouth daily.   desvenlafaxine 100 MG 24 hr tablet Commonly known as: PRISTIQ Take 100 mg by mouth daily.   Fish Oil 1000 MG Caps Take 1,000 mg by mouth daily.   gabapentin 600 MG tablet Commonly known as: NEURONTIN Take 600 mg by mouth at bedtime as needed (nerve pain).   levothyroxine 200 MCG tablet Commonly known as: SYNTHROID Take 100-200 mcg by mouth See admin instructions. Take 1 tablet (200 mcg) by mouth daily, except on Sundays 1/2 tablet ( 100 mcg)   LORazepam 1 MG tablet Commonly known as: ATIVAN Take 1 mg by mouth 2 (two) times daily.   methocarbamol 750 MG tablet Commonly known as: ROBAXIN Take 750 mg by mouth 2 (two) times daily.   milk thistle 175 MG tablet Take 175 mg by mouth daily.   multivitamin with minerals Tabs tablet Take 1 tablet by mouth daily. Men's 50+   omeprazole 40 MG capsule Commonly known as: PRILOSEC TAKE ONE CAPSULE BY  MOUTH TWO TIMES A DAY What changed: when to take this   oxyCODONE 5 MG immediate release tablet Commonly known as: Oxy IR/ROXICODONE Take 5 mg by mouth every 6 (six) hours as needed for severe pain. What changed: Another medication with the same name was added. Make sure you understand how and when to take each.   oxyCODONE 5 MG immediate release tablet Commonly known as: Oxy IR/ROXICODONE Take 1 tablet (5 mg total) by mouth every 6 (six) hours as  needed for moderate pain, severe pain or breakthrough pain. What changed: You were already taking a medication with the same name, and this prescription was added. Make sure you understand how and when to take each.   propranolol 10 MG tablet Commonly known as: INDERAL Take 10 mg by mouth daily as needed (anxiety).   thiamine 100 MG tablet Commonly known as: Vitamin B-1 Take 100 mg by mouth daily.   traZODone 50 MG tablet Commonly known as: DESYREL Take 50 mg by mouth at bedtime.         Follow-up Information    Leighton Ruff, MD. Go on 6/98/6148.   Specialties: General Surgery, Colon and Rectal Surgery Why: at 9:30 AM for post-operative follow up. please arrive by 9:00 AM to get checked in and fill out any necessary paperwork. Contact information: [place] [address] [phone]               Signed: Obie Dredge, [facility] Surgery 12/18/2020, 12:51 PM

## 2020-12-18 NOTE — Discharge Instructions (Signed)
[facility]      [facility] [phone]  OPEN ABDOMINAL SURGERY: POST OP INSTRUCTIONS  Always review your discharge instruction sheet given to you by the facility where your surgery was performed.  IF YOU HAVE DISABILITY OR FAMILY LEAVE FORMS, YOU MUST BRING THEM TO THE OFFICE FOR PROCESSING.  PLEASE DO NOT GIVE THEM TO YOUR DOCTOR.  1. A prescription for pain medication may be given to you upon discharge.  Take your pain medication as prescribed, if needed.  If narcotic pain medicine is not needed, then you may take acetaminophen (Tylenol) or ibuprofen (Advil) as needed. 2. Take your usually prescribed medications unless otherwise directed. 3. If you need a refill on your pain medication, please contact your pharmacy. They will contact our office to request authorization.  Prescriptions will not be filled after 5pm or on week-ends. 4. You should follow a light diet the first few days after arrival home, such as soup and crackers, pudding, etc.unless your doctor has advised otherwise. A high-fiber, low fat diet can be resumed as tolerated.   Be sure to include lots of fluids daily. Most patients will experience some swelling and bruising on the chest and neck area.  Ice packs will help.  Swelling and bruising can take several days to resolve 5. Most patients will experience some swelling and bruising in the area of the incision. Ice pack will help. Swelling and bruising can take several days to resolve..  6. It is common to experience some constipation if taking pain medication after surgery.  Increasing fluid intake and taking a stool softener will usually help or prevent this problem from occurring.  A mild laxative (Milk of Magnesia or Miralax) should be taken according to package directions if there are no bowel movements after 48 hours. 7.  You may have steri-strips (small skin tapes) in place directly over the incision.  These strips should be left on the skin for 7-10 days.  If your  surgeon used skin glue on the incision, you may shower in 24 hours.  The glue will flake off over the next 2-3 weeks.  Any sutures or staples will be removed at the office during your follow-up visit. You may find that a light gauze bandage over your incision may keep your staples from being rubbed or pulled. You may shower and replace the bandage daily. 8. ACTIVITIES:  You may resume regular (light) daily activities beginning the next day--such as daily self-care, walking, climbing stairs--gradually increasing activities as tolerated.  You may have sexual intercourse when it is comfortable.  Refrain from any heavy lifting or straining until approved by your doctor. a. You may drive when you no longer are taking prescription pain medication, you can comfortably wear a seatbelt, and you can safely maneuver your car and apply brakes b. Return to Work: ___________________________________ 9. You should see your doctor in the office for a follow-up appointment approximately two weeks after your surgery.  Make sure that you call for this appointment within a day or two after you arrive home to insure a convenient appointment time. OTHER INSTRUCTIONS:  _____________________________________________________________ _____________________________________________________________  WHEN TO CALL YOUR DOCTOR: 1. Fever over 101.0 2. Inability to urinate 3. Nausea and/or vomiting 4. Extreme swelling or bruising 5. Continued bleeding from incision. 6. Increased pain, redness, or drainage from the incision. 7. Difficulty swallowing or breathing 8. Muscle cramping or spasms. 9. Numbness or tingling in hands or feet or around lips.  The clinic staff is available to   answer your questions during regular business hours.  Please don't hesitate to call and ask to speak to one of the nurses if you have concerns.  For further questions, please visit [email or web address]   

## 2020-12-18 NOTE — Progress Notes (Signed)
    5 Days Post-Op  Subjective: No acute changes. Tolerating soft diet. Passing flatus, no BM yet. No nausea/vomiting.   Objective: Vital signs in last 24 hours: Temp:  [97.5 F (36.4 C)-97.9 F (36.6 C)] 97.8 F (36.6 C) (04/21 0449) Pulse Rate:  [49-57] 49 (04/21 0449) Resp:  [15] 15 (04/21 0449) BP: (126-159)/(79-97) 126/79 (04/21 0449) SpO2:  [96 %-99 %] 96 % (04/21 0449) Last BM Date: 12/13/20  Intake/Output from previous day: 04/20 0701 - 04/21 0700 In: 720 [P.O.:720] Out: -  Intake/Output this shift: Total I/O In: 240 [P.O.:240] Out: -   PE: General: resting comfortably, NAD Neuro: alert and oriented, no focal deficits Resp: normal work of breathing Abdomen: soft, nondistended, nontender. Incision clean and dry with no erythema, induration or drainage. Extremities: warm and well-perfused   Lab Results:  No results for input(s): WBC, HGB, HCT, PLT in the last 72 hours. BMET Recent Labs    12/16/20 0450  NA 139  K 3.5  CL 105  CO2 26  GLUCOSE 121*  BUN 7*  CREATININE 0.96  CALCIUM 9.0   PT/INR No results for input(s): LABPROT, INR in the last 72 hours. CMP     Component Value Date/Time   NA 139 12/16/2020 0450   K 3.5 12/16/2020 0450   CL 105 12/16/2020 0450   CO2 26 12/16/2020 0450   GLUCOSE 121 (H) 12/16/2020 0450   BUN 7 (L) 12/16/2020 0450   CREATININE 0.96 12/16/2020 0450   CALCIUM 9.0 12/16/2020 0450   PROT 8.1 12/13/2020 1831   ALBUMIN 5.0 12/13/2020 1831   AST 26 12/13/2020 1831   ALT 34 12/13/2020 1831   ALKPHOS 56 12/13/2020 1831   BILITOT 1.0 12/13/2020 1831   GFRNONAA >60 12/16/2020 0450   GFRAA >60 02/26/2019 1205   Lipase     Component Value Date/Time   LIPASE 30 12/13/2020 1831       Studies/Results: No results found.   Assessment/Plan 65 yo male with closed loop bowel obstruction, POD4 s/p exploratory laparotomy and lysis of adhesions. - Continue soft diet - Awaiting bowel movement. Will give suppository  today.  - Multimodal pain control, transition to oral medications only  - Pulmonary toilet, ambulate - VTE: lovenox, SCDs - Dispo: inpatient, med-surg. Possible discharge this afternoon if patient has a bowel movement.    LOS: 5 days   Michaelle Birks, MD [facility] Surgery General, Hepatobiliary and Pancreatic Surgery 12/18/20 9:58 AM

## 2020-12-23 ENCOUNTER — Other Ambulatory Visit: Payer: Self-pay | Admitting: Gastroenterology

## 2020-12-31 ENCOUNTER — Other Ambulatory Visit: Payer: Self-pay | Admitting: Internal Medicine

## 2020-12-31 DIAGNOSIS — R918 Other nonspecific abnormal finding of lung field: Secondary | ICD-10-CM

## 2020-12-31 DIAGNOSIS — I712 Thoracic aortic aneurysm, without rupture, unspecified: Secondary | ICD-10-CM

## 2021-01-05 ENCOUNTER — Encounter: Payer: 59 | Admitting: Gastroenterology

## 2021-01-15 ENCOUNTER — Ambulatory Visit
Admission: RE | Admit: 2021-01-15 | Discharge: 2021-01-15 | Disposition: A | Discharge status: Home / Self Care | Payer: 59 | Source: Ambulatory Visit | Attending: Internal Medicine | Admitting: Internal Medicine

## 2021-01-15 ENCOUNTER — Other Ambulatory Visit: Payer: Self-pay

## 2021-01-15 DIAGNOSIS — I712 Thoracic aortic aneurysm, without rupture, unspecified: Secondary | ICD-10-CM

## 2021-01-15 DIAGNOSIS — R918 Other nonspecific abnormal finding of lung field: Secondary | ICD-10-CM

## 2021-01-15 IMAGING — CT CT ANGIO CHEST
1 series · 19 of 32 positions shown · IV contrast (APPLIED)
Comparison: [DATE]

CLINICAL DATA: Thoracic aortic aneurysm, surveillance follow-up

EXAM:
CT ANGIOGRAPHY CHEST WITH CONTRAST
TECHNIQUE: Multidetector CT imaging of the chest was performed using the
standard protocol during bolus administration of intravenous
contrast. Multiplanar CT image reconstructions and MIPs were
obtained to evaluate the vascular anatomy.
CONTRAST:  75mL [BR] IOPAMIDOL ([BR]) INJECTION 76%

[Series 4: chest angio · axial · 0.78mm/px · z∈[-350,-5]mm · 19 of 123 slices shown]
[im 4/123  lung]
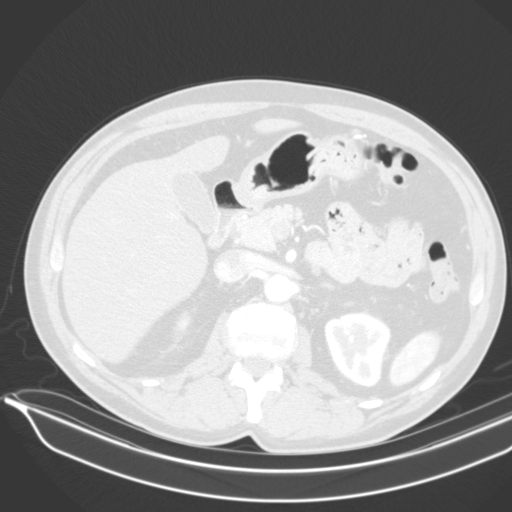
[im 12/123  soft-tissue]
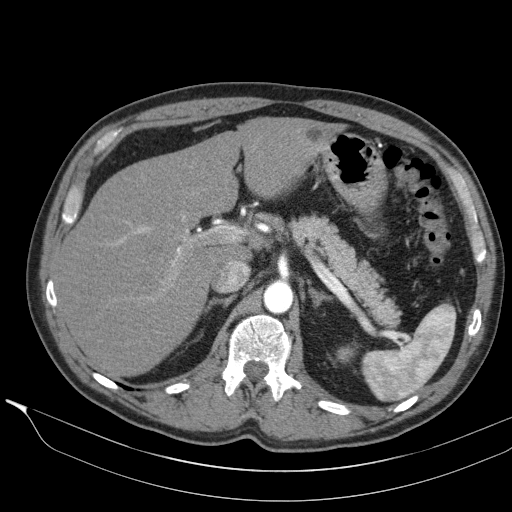
[im 16/123  lung]
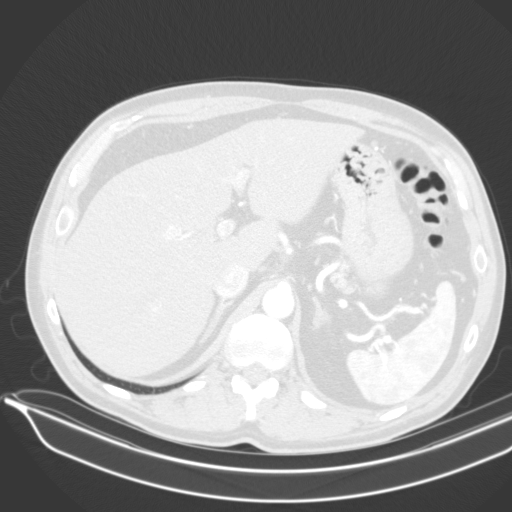
[im 24/123  soft-tissue]
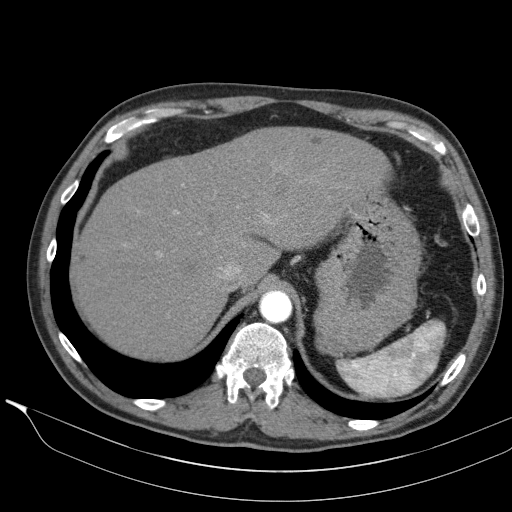
[im 32/123  lung]
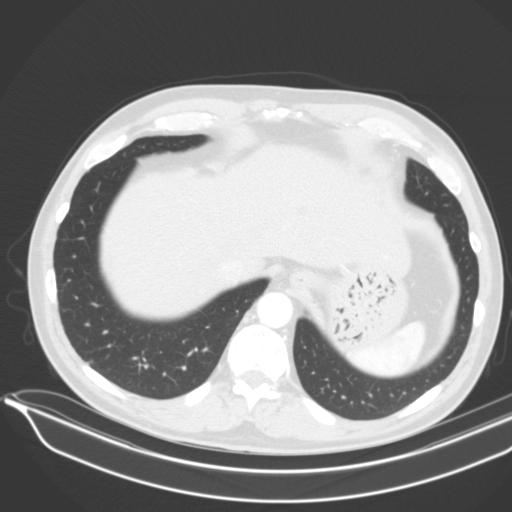
[im 36/123  soft-tissue]
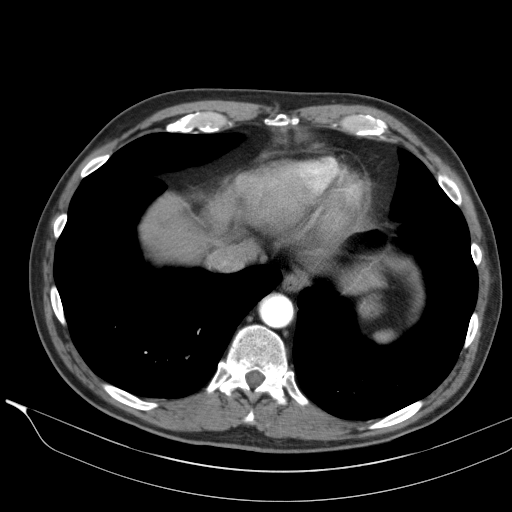
[im 44/123  lung]
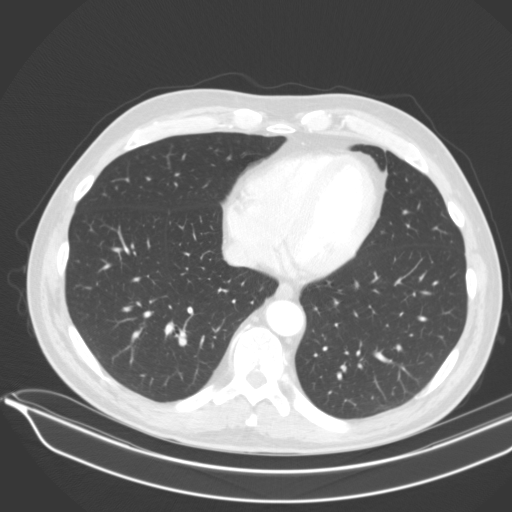
[im 48/123  soft-tissue]
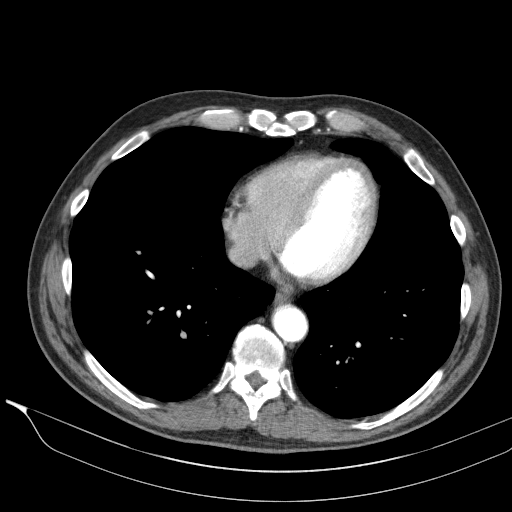
[im 56/123  lung]
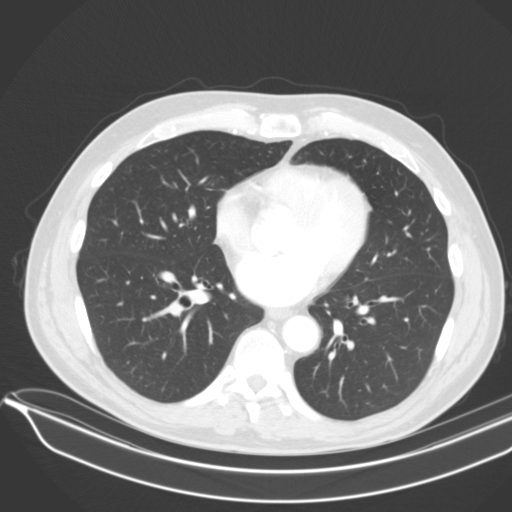
[im 63/123  soft-tissue]
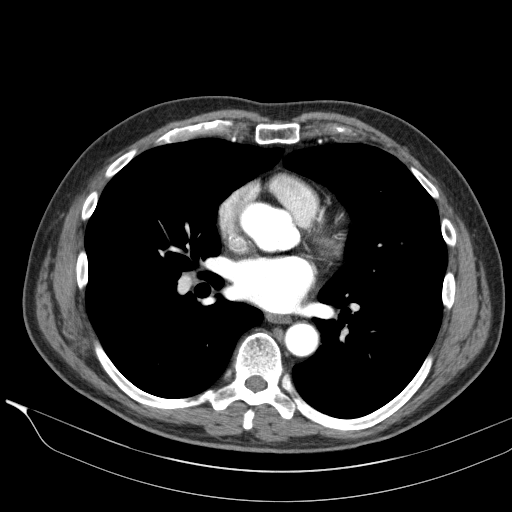
[im 67/123  lung]
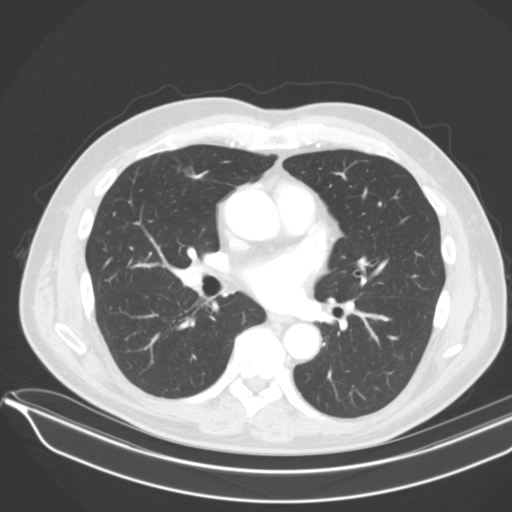
[im 75/123  soft-tissue]
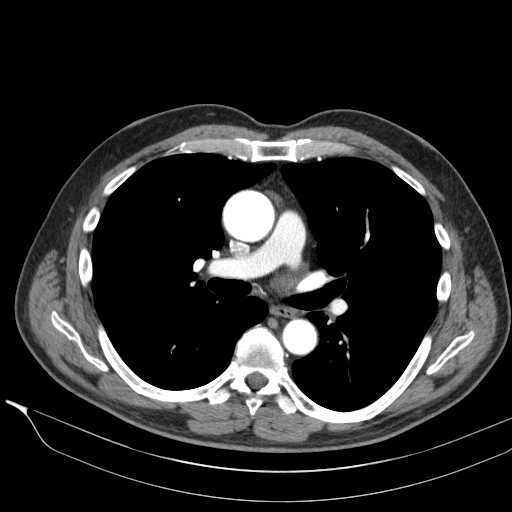
[im 79/123  lung]
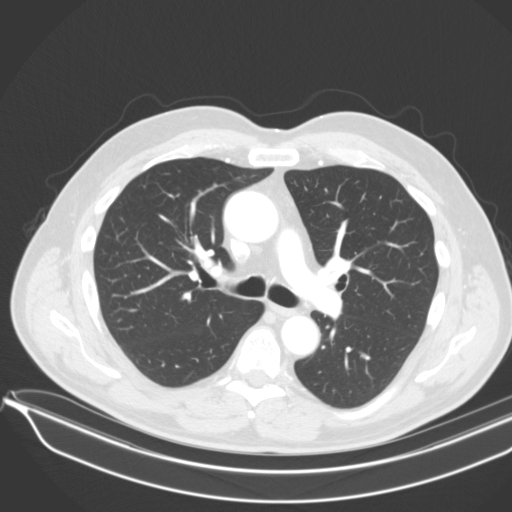
[im 87/123  soft-tissue]
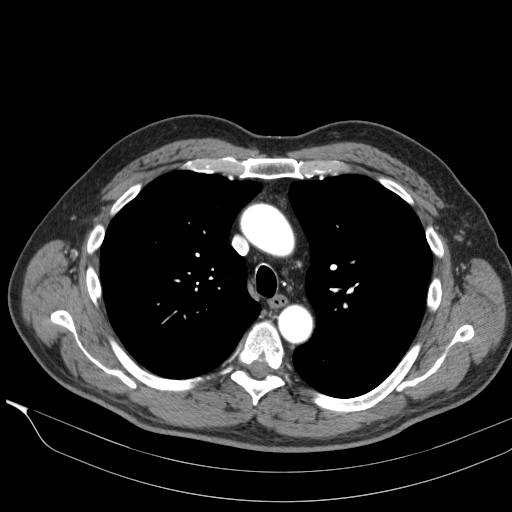
[im 91/123  lung]
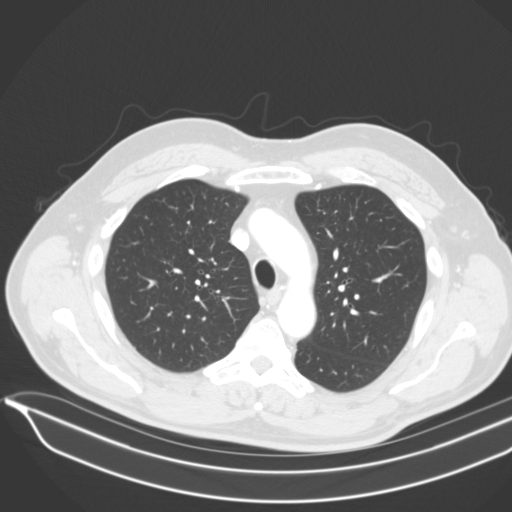
[im 99/123  soft-tissue]
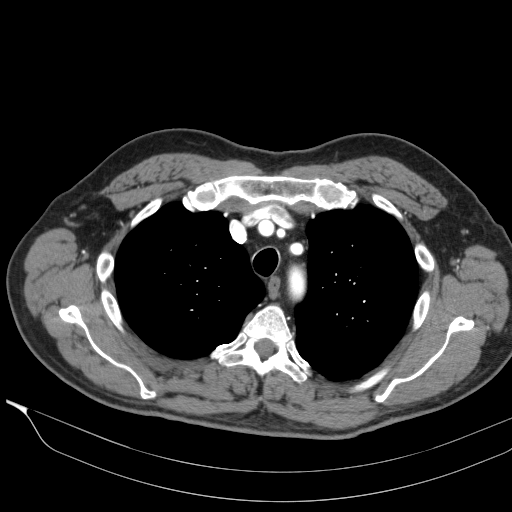
[im 107/123  lung]
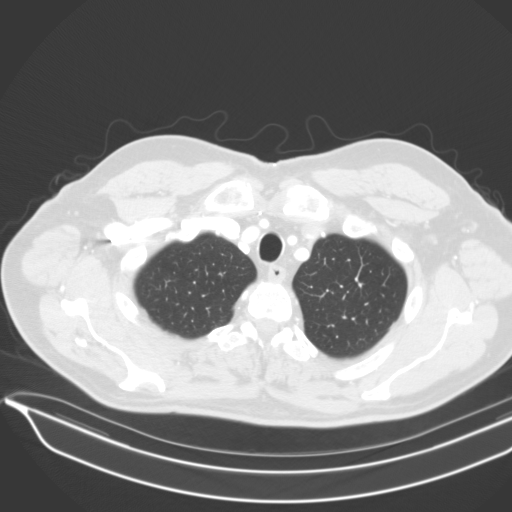
[im 111/123  soft-tissue]
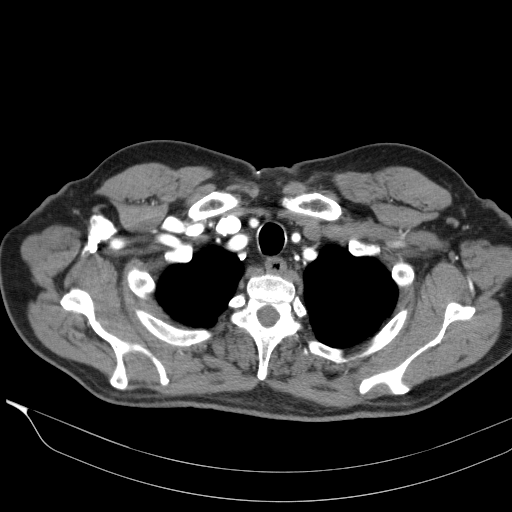
[im 119/123  lung]
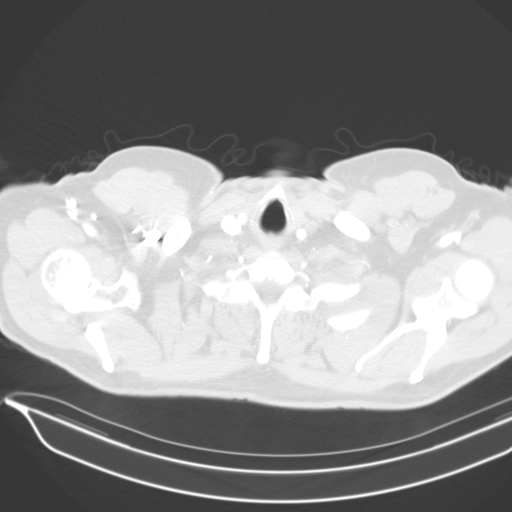

[19 of 32 positions shown; findings below may reference images not displayed]

FINDINGS: Cardiovascular: Stable mild fusiform aneurysmal dilatation of the
ascending thoracic aorta, maximal diameter remains 4 cm. Remainder
of the thoracic aorta is normal in caliber. Patent 3 vessel arch
anatomy. Minor atherosclerotic change. No dissection.

No mediastinal hemorrhage or hematoma.

Central pulmonary arteries are patent and normal in caliber. No
significant central pulmonary embolus.

Normal heart size. Native coronary atherosclerosis. No pericardial
effusion.

Mediastinum/Nodes: Remote thyroidectomy. Trachea and central airways
are patent. Esophagus nondilated. No hiatal hernia.

Negative for adenopathy.

Lungs/Pleura: Stable 4 mm left upper lobe nodule, image [DATE].

Similar right middle lobe scarring.

No focal pneumonia, collapse or consolidation. Negative for edema or
interstitial process.

No pleural abnormality, effusion or pneumothorax.

Upper Abdomen: Similar scattered hypodense hepatic cysts.

No acute upper abdominal finding.

Upper abdominal vasculature all patent. Minor atherosclerotic
change.

Musculoskeletal: No chest wall abnormality. No acute or significant
osseous findings.

Review of the MIP images confirms the above findings.
IMPRESSION: Stable minor aneurysmal dilatation of the ascending thoracic aorta,
maximal diameter remains 4 cm.

Recommend annual imaging followup by CTA or MRA. This recommendation
follows [BR] ACCF/AHA/AATS/ACR/ASA/SCA/ALEX MOLATISE/ALEX MOLATISE/ALEX MOLATISE/ALEX MOLATISE Guidelines
for the Diagnosis and Management of Patients with Thoracic Aortic
Disease. Circulation. [BR]; 121: E266-e369. Aortic aneurysm NOS
([BR]-[BR])

No other acute intrathoracic finding.

Native coronary atherosclerosis

Stable left upper lobe 4 mm nodule.

Aortic Atherosclerosis ([BR]-[BR]).

## 2021-01-15 MED ORDER — IOPAMIDOL (ISOVUE-370) INJECTION 76%
75.0000 mL | Freq: Once | INTRAVENOUS | Status: AC | PRN
  Administered 2021-01-15: 75 mL via INTRAVENOUS

## 2021-03-31 ENCOUNTER — Ambulatory Visit (AMBULATORY_SURGERY_CENTER): Payer: 59 | Admitting: *Deleted

## 2021-03-31 ENCOUNTER — Other Ambulatory Visit: Payer: Self-pay

## 2021-03-31 VITALS — Ht 74.0 in | Wt 210.0 lb

## 2021-03-31 DIAGNOSIS — K219 Gastro-esophageal reflux disease without esophagitis: Secondary | ICD-10-CM

## 2021-03-31 DIAGNOSIS — K259 Gastric ulcer, unspecified as acute or chronic, without hemorrhage or perforation: Secondary | ICD-10-CM

## 2021-03-31 NOTE — Progress Notes (Signed)
Completed virtual pre-visit. Instructions sent through MyChart  [email or web address]  No egg or soy allergy known to patient  No issues with past sedation with any surgeries or procedures Patient denies ever being told they had issues or difficulty with intubation  No FH of Malignant Hyperthermia No diet pills per patient No home 02 use per patient  No blood thinners per patient  Pt denies issues with constipation  No A fib or A flutter  EMMI video to pt or via [place] 19 guidelines implemented in PV today with Pt and RN   Due to the COVID-19 pandemic we are asking patients to follow certain guidelines.  Pt aware of COVID protocols and [facility] guidelines

## 2021-04-13 ENCOUNTER — Telehealth: Payer: Self-pay | Admitting: Gastroenterology

## 2021-04-13 NOTE — Telephone Encounter (Signed)
Pt states he has had diarrhea 4-5 days- has started Imodium- no fever Pt instructed to proceed with procedure as scheduled -  Vernon Fowler [facility]

## 2021-04-13 NOTE — Telephone Encounter (Signed)
Inbound call from patient. Have had diarrhea for 4/5 days. Have EGD 8/16. Wants to know if he should go through with procedure?

## 2021-04-14 ENCOUNTER — Encounter: Payer: 59 | Admitting: Gastroenterology

## 2021-04-14 ENCOUNTER — Encounter: Payer: Self-pay | Admitting: Gastroenterology

## 2021-04-14 ENCOUNTER — Ambulatory Visit (AMBULATORY_SURGERY_CENTER): Payer: 59 | Admitting: Gastroenterology

## 2021-04-14 VITALS — BP 132/78 | HR 43 | Temp 97.1°F | Resp 15 | Ht 74.0 in | Wt 210.0 lb

## 2021-04-14 DIAGNOSIS — K259 Gastric ulcer, unspecified as acute or chronic, without hemorrhage or perforation: Secondary | ICD-10-CM | POA: Diagnosis present

## 2021-04-14 DIAGNOSIS — K219 Gastro-esophageal reflux disease without esophagitis: Secondary | ICD-10-CM | POA: Diagnosis not present

## 2021-04-14 DIAGNOSIS — K22719 Barrett's esophagus with dysplasia, unspecified: Secondary | ICD-10-CM

## 2021-04-14 DIAGNOSIS — K3189 Other diseases of stomach and duodenum: Secondary | ICD-10-CM

## 2021-04-14 DIAGNOSIS — K319 Disease of stomach and duodenum, unspecified: Secondary | ICD-10-CM

## 2021-04-14 MED ORDER — SODIUM CHLORIDE 0.9 % IV SOLN: 500.0000 mL | Freq: Once | INTRAVENOUS | Status: DC

## 2021-04-14 NOTE — Progress Notes (Signed)
Referring Provider: Shon Baton, MD Primary Care Physician:  Shon Baton, MD  Chief complaint:  Gastric ulcers   IMPRESSION:  Gastric ulcers  PLAN: EGD  Please see the "Patient Instructions" section for addition details about the plan.  HPI: Vernon Fowler is a 65 y.o. male who presents for EGD to follow-up on gastric ulcers. Please see my 07/17/20 office visit for complete details.  Gastric ulcers on EGD 12/12/19    - biopsies negative for H pylori    - no NSAIDs  EGD recommended to follow-up on healing. Timing of procedure delay due to recent hospitalization for bowel obstruction.   No known family history of colon cancer or polyps. No family history of uterine/endometrial cancer, pancreatic cancer or gastric/stomach cancer.   Past Medical History:  Diagnosis Date   Alcohol abuse    Anxiety    Cancer ([place])    GERD (gastroesophageal reflux disease)    Hyperlipidemia    Hypertension    Sleep apnea    w/CPAP   Thyroid disease     Past Surgical History:  Procedure Laterality Date   COLONOSCOPY     FASCIOTOMY Right 10/2019   LAPAROTOMY N/A 12/13/2020   Procedure: EXPLORATORY LAPAROTOMY, LYSIS OF ADHESIONS;  Surgeon: Leighton Ruff, MD;  Location: [facility];  Service: General;  Laterality: N/A;   SHOULDER ARTHROSCOPY WITH ROTATOR CUFF REPAIR Right 03/01/2019   Procedure: Right shoulder arthroscopy, subacromial decompression, distal clavicle resection, rotator cuff repair;  Surgeon: Justice Britain, MD;  Location: [facility];  Service: Orthopedics;  Laterality: Right;   THYROIDECTOMY     1987   UPPER GASTROINTESTINAL ENDOSCOPY      Current Outpatient Medications  Medication Sig Dispense Refill   b complex vitamins tablet Take 1 tablet by mouth daily.     buPROPion (WELLBUTRIN XL) 150 MG 24 hr tablet Take 150 mg by mouth every morning.     carboxymethylcellulose (REFRESH PLUS) 0.5 % SOLN Place 1 drop into both eyes 2 (two) times daily as needed (dry eyes).      Coenzyme  Q10 (COQ10) 100 MG CAPS Take 100 mg by mouth daily.      gabapentin (NEURONTIN) 600 MG tablet Take 600 mg by mouth at bedtime as needed (nerve pain).     irbesartan (AVAPRO) 150 MG tablet Take 150 mg by mouth daily.     levothyroxine (SYNTHROID, LEVOTHROID) 200 MCG tablet Take 100-200 mcg by mouth See admin instructions. Take 1 tablet (200 mcg) by mouth daily, except on Sundays 1/2 tablet ( 100 mcg)  0   LORazepam (ATIVAN) 1 MG tablet Take 1 mg by mouth 2 (two) times daily.     methocarbamol (ROBAXIN) 750 MG tablet Take 750 mg by mouth 2 (two) times daily.     Multiple Vitamin (MULTIVITAMIN WITH MINERALS) TABS tablet Take 1 tablet by mouth daily. Men's 50+     Omega-3 Fatty Acids (FISH OIL) 1000 MG CAPS Take 1,000 mg by mouth daily.     omeprazole (PRILOSEC) 40 MG capsule TAKE ONE CAPSULE BY MOUTH TWICE A DAY 180 capsule 3   oxyCODONE (OXY IR/ROXICODONE) 5 MG immediate release tablet Take 5 mg by mouth every 6 (six) hours as needed for severe pain.     oxyCODONE (OXY IR/ROXICODONE) 5 MG immediate release tablet Take 1 tablet (5 mg total) by mouth every 6 (six) hours as needed for moderate pain, severe pain or breakthrough pain. 15 tablet 0   propranolol (INDERAL) 10 MG tablet Take 10 mg by  mouth daily as needed (anxiety).     rosuvastatin (CRESTOR) 10 MG tablet Take 10 mg by mouth at bedtime.     sertraline (ZOLOFT) 100 MG tablet Take 100 mg by mouth daily.     thiamine (VITAMIN B-1) 100 MG tablet Take 100 mg by mouth daily.     traZODone (DESYREL) 50 MG tablet Take 50 mg by mouth at bedtime.      acetaminophen (TYLENOL) 500 MG tablet Take 2 tablets (1,000 mg total) by mouth every 6 (six) hours as needed. (Patient not taking: No sig reported) 30 tablet 0   Current Facility-Administered Medications  Medication Dose Route Frequency Provider Last Rate Last Admin   0.9 %  sodium chloride infusion  500 mL Intravenous Once Thornton Park, MD        Allergies as of 04/14/2021 - Review Complete  04/14/2021  Allergen Reaction Noted   Sulfa antibiotics Rash 03/31/2011    Family History  Problem Relation Age of Onset   Cancer Mother    Anxiety disorder Mother    Diabetes Father    Hyperlipidemia Father    Hypertension Father    Cancer Maternal Grandmother    Cancer Paternal Grandmother    Heart disease Paternal Grandfather    Anxiety disorder Daughter    Colon cancer Neg Hx    Esophageal cancer Neg Hx    Stomach cancer Neg Hx    Rectal cancer Neg Hx       Physical Exam: General:   Alert,  well-nourished, pleasant and cooperative in NAD Head:  Normocephalic and atraumatic. Eyes:  Sclera clear, no icterus.   Conjunctiva pink. Ears:  Normal auditory acuity. Nose:  No deformity, discharge,  or lesions. Mouth:  No deformity or lesions.   Neck:  Supple; no masses or thyromegaly. Lungs:  Clear throughout to auscultation.   No wheezes. Heart:  Regular rate and rhythm; no murmurs. Abdomen:  Soft,nontender, nondistended, normal bowel sounds, no rebound or guarding. No hepatosplenomegaly.   Rectal:  Deferred  Msk:  Symmetrical. No boney deformities LAD: No inguinal or umbilical LAD Extremities:  No clubbing or edema. Neurologic:  Alert and  oriented x4;  grossly nonfocal Skin:  Intact without significant lesions or rashes. Psych:  Alert and cooperative. Normal mood and affect.    Debie Ashline L. Tarri Glenn, MD, MPH 04/14/2021, 2:52 PM

## 2021-04-14 NOTE — Patient Instructions (Signed)
Resume previous diet and present medications. (Avoid NSAIDS) Awaiting pathology results.  YOU HAD AN ENDOSCOPIC PROCEDURE TODAY AT [facility]:   Refer to the procedure report that was given to you for any specific questions about what was found during the examination.  If the procedure report does not answer your questions, please call your gastroenterologist to clarify.  If you requested that your care partner not be given the details of your procedure findings, then the procedure report has been included in a sealed envelope for you to review at your convenience later.  YOU SHOULD EXPECT: Some feelings of bloating in the abdomen. Passage of more gas than usual.  Walking can help get rid of the air that was put into your GI tract during the procedure and reduce the bloating. If you had a lower endoscopy (such as a colonoscopy or flexible sigmoidoscopy) you may notice spotting of blood in your stool or on the toilet paper. If you underwent a bowel prep for your procedure, you may not have a normal bowel movement for a few days.  Please Note:  You might notice some irritation and congestion in your nose or some drainage.  This is from the oxygen used during your procedure.  There is no need for concern and it should clear up in a day or so.  SYMPTOMS TO REPORT IMMEDIATELY:  Following upper endoscopy (EGD)  Vomiting of blood or coffee ground material  New chest pain or pain under the shoulder blades  Painful or persistently difficult swallowing  New shortness of breath  Fever of 100F or higher  Black, tarry-looking stools  For urgent or emergent issues, a gastroenterologist can be reached at any hour by calling [phone]. Do not use MyChart messaging for urgent concerns.    DIET:  We do recommend a small meal at first, but then you may proceed to your regular diet.  Drink plenty of fluids but you should avoid alcoholic beverages for 24 hours.  ACTIVITY:  You should plan  to take it easy for the rest of today and you should NOT DRIVE or use heavy machinery until tomorrow (because of the sedation medicines used during the test).    FOLLOW UP: Our staff will call the number listed on your records 48-72 hours following your procedure to check on you and address any questions or concerns that you may have regarding the information given to you following your procedure. If we do not reach you, we will leave a message.  We will attempt to reach you two times.  During this call, we will ask if you have developed any symptoms of COVID 19. If you develop any symptoms (ie: fever, flu-like symptoms, shortness of breath, cough etc.) before then, please call [phone].  If you test positive for Covid 19 in the 2 weeks post procedure, please call and report this information to [place].    If any biopsies were taken you will be contacted by phone or by letter within the next 1-3 weeks.  Please call us at [phone] if you have not heard about the biopsies in 3 weeks.    SIGNATURES/CONFIDENTIALITY: You and/or your care partner have signed paperwork which will be entered into your electronic medical record.  These signatures attest to the fact that that the information above on your After Visit Summary has been reviewed and is understood.  Full responsibility of the confidentiality of this discharge information lies with you and/or your care-partner.

## 2021-04-14 NOTE — Progress Notes (Signed)
Called to room to assist during endoscopic procedure.  Patient ID and intended procedure confirmed with present staff. Received instructions for my participation in the procedure from the performing physician.  

## 2021-04-14 NOTE — Op Note (Signed)
[facility] Patient Name: Vernon Fowler Procedure Date: 04/14/2021 2:46 PM MRN: [id number] Endoscopist: Thornton Park MD, MD Age: 65 Referring MD:  Date of Birth: [birth year] Gender: Male Account #: [id number] Procedure:                Upper GI endoscopy Indications:              Follow-up of acute gastric ulcer                           Gastric ulcers on EGD 12/12/19                           - biopsies negative for H pylori                           - no NSAIDs                           Barrett's esophagus on EGD in [place] 2-3 years                            ago                           - irregular z-line on EGD 12/12/19, biopsies                            negative for Barrett's                           GERD controlled on daily PPI therapy Medicines:                Monitored Anesthesia Care Procedure:                Pre-Anesthesia Assessment:                           - Prior to the procedure, a History and Physical                            was performed, and patient medications and                            allergies were reviewed. The patient's tolerance of                            previous anesthesia was also reviewed. The risks                            and benefits of the procedure and the sedation                            options and risks were discussed with the patient.                            All questions were answered, and informed consent  was obtained. Prior Anticoagulants: The patient has                            taken no previous anticoagulant or antiplatelet                            agents. ASA Grade Assessment: III - A patient with                            severe systemic disease. After reviewing the risks                            and benefits, the patient was deemed in                            satisfactory condition to undergo the procedure.                           After obtaining informed  consent, the endoscope was                            passed under direct vision. Throughout the                            procedure, the patient's blood pressure, pulse, and                            oxygen saturations were monitored continuously. The                            GIF [id number] was introduced through the                            mouth, and advanced to the third part of duodenum.                            The upper GI endoscopy was accomplished without                            difficulty. The patient tolerated the procedure                            well. Scope In: Scope Out: Findings:                 The Z-line was irregular and was found 39 cm from                            the incisors. There is a Writer. Biopsies                            were taken with a cold forceps for histology.  Estimated blood loss was minimal.                           A small hiatal hernia was present.                           Patchy mildly erythematous mucosa without bleeding                            was found in the gastric body. Biopsies were taken                            from the antrum, body, and fundus with a cold                            forceps for histology. Estimated blood loss was                            minimal.                           The cardia and gastric fundus were normal on                            retroflexion.                           The exam was otherwise without abnormality. Complications:            No immediate complications. Estimated blood loss:                            Minimal. Estimated Blood Loss:     Estimated blood loss was minimal. Impression:               - Z-line irregular, 39 cm from the incisors.                            Biopsied.                           - Small hiatal hernia.                           - Erythematous mucosa in the gastric body. Biopsied.                           - The  examination was otherwise normal. Recommendation:           - Patient has a contact number available for                            emergencies. The signs and symptoms of potential                            delayed complications were discussed with the  patient. Return to normal activities tomorrow.                            Written discharge instructions were provided to the                            patient.                           - Resume previous diet.                           - Continue present medications.                           - Await pathology results.                           - Avoid all NSAIDs. Thornton Park MD, MD 04/14/2021 3:16:11 PM This report has been signed electronically.

## 2021-04-14 NOTE — Progress Notes (Signed)
Pt's states no medical or surgical changes since previsit or office visit.  ° °VS DT °

## 2021-04-14 NOTE — Progress Notes (Signed)
To PACU, VSS. Report to [email or web address] 

## 2021-04-16 ENCOUNTER — Telehealth: Payer: Self-pay

## 2021-04-16 NOTE — Telephone Encounter (Signed)
First post procedure follow up call, no answer 

## 2021-04-16 NOTE — Telephone Encounter (Signed)
  Follow up Call-  Call back number 04/14/2021 12/12/2019  Post procedure Call Back phone  # [phone]  Permission to leave phone message Yes Yes  Some recent data might be hidden     Patient questions:  Do you have a fever, pain , or abdominal swelling? No. Pain Score  0 *  Have you tolerated food without any problems? Yes.    Have you been able to return to your normal activities? Yes.    Do you have any questions about your discharge instructions: Diet   No. Medications  No. Follow up visit  No.  Do you have questions or concerns about your Care? No.  Actions: * If pain score is 4 or above: No action needed, pain <4.  Have you developed a fever since your procedure? no  2.   Have you had an respiratory symptoms (SOB or cough) since your procedure? no  3.   Have you tested positive for COVID 19 since your procedure no  4.   Have you had any family members/close contacts diagnosed with the COVID 19 since your procedure?  no   If yes to any of these questions please route to Joylene John, RN and Joella Prince, RN

## 2021-04-20 ENCOUNTER — Encounter: Payer: Self-pay | Admitting: Gastroenterology

## 2021-04-29 ENCOUNTER — Telehealth: Payer: Self-pay | Admitting: Gastroenterology

## 2021-04-29 NOTE — Telephone Encounter (Signed)
I recommend some stool studies including culture, O&P, fecal calprotectin, and fecal elastase. Schedule office visit with me or APP - whoever has the first available appointment. In the meantime, I recommend minimizing dairy, carbonated beverages, and any sugar substitutes. Thanks.

## 2021-04-29 NOTE — Telephone Encounter (Signed)
Inbound call from patient. States he has had diarrhea for the last 4 days and cramping. Have taken imodium but he says it does not feel it is doing anything. Requesting if something can be called in to help calm. Best contact number [phone]

## 2021-04-29 NOTE — Telephone Encounter (Signed)
Pt stating that he has been having diarrhea for several weeks. Pt stated that he did have diarrhea prior to his EGD recently. Pt stated that  it stopped some but has started back about 5 days ago. Pt stated that he has 5-6 liquid BMs per day and takes 4 imodium per day. Please Advise

## 2021-04-30 ENCOUNTER — Other Ambulatory Visit: Payer: Self-pay

## 2021-04-30 DIAGNOSIS — R197 Diarrhea, unspecified: Secondary | ICD-10-CM

## 2021-04-30 NOTE — Telephone Encounter (Signed)
Lab orders in epic. Pt scheduled to see Ellouise Newer PA 05/13/21 at 1:30pm. Left message for pt to call back.

## 2021-04-30 NOTE — Telephone Encounter (Signed)
Spoke with pt and he is aware and will come for labs. Appt moved to 9/15'@1'$ :30pm and pt aware.

## 2021-05-13 ENCOUNTER — Ambulatory Visit: Payer: 59 | Admitting: Physician Assistant

## 2021-05-13 ENCOUNTER — Other Ambulatory Visit: Payer: 59

## 2021-05-14 ENCOUNTER — Encounter: Payer: Self-pay | Admitting: Physician Assistant

## 2021-05-14 ENCOUNTER — Ambulatory Visit (INDEPENDENT_AMBULATORY_CARE_PROVIDER_SITE_OTHER): Payer: 59 | Admitting: Physician Assistant

## 2021-05-14 ENCOUNTER — Other Ambulatory Visit: Payer: 59

## 2021-05-14 VITALS — BP 118/66 | HR 62 | Ht 74.0 in | Wt 215.1 lb

## 2021-05-14 DIAGNOSIS — R197 Diarrhea, unspecified: Secondary | ICD-10-CM

## 2021-05-14 DIAGNOSIS — R1084 Generalized abdominal pain: Secondary | ICD-10-CM

## 2021-05-14 NOTE — Patient Instructions (Signed)
Start daily probiotics.  If you are age 65 or older, your body mass index should be between 23-30. Your Body mass index is 27.62 kg/m. If this is out of the aforementioned range listed, please consider follow up with your Primary Care Provider.  If you are age 70 or younger, your body mass index should be between 19-25. Your Body mass index is 27.62 kg/m. If this is out of the aformentioned range listed, please consider follow up with your Primary Care Provider.   __________________________________________________________  The [facility] providers would like to encourage you to use [facility] to communicate with providers for non-urgent requests or questions.  Due to long hold times on the telephone, sending your provider a message by [facility] may be a faster and more efficient way to get a response.  Please allow 48 business hours for a response.  Please remember that this is for non-urgent requests.

## 2021-05-14 NOTE — Progress Notes (Signed)
Chief Complaint: Diarrhea  HPI:    Vernon Fowler is a 65 year old male, known to Dr. Tarri Glenn, with a past medical history of reflux, alcohol abuse, anxiety and others listed below, who was referred to me by Shon Baton, MD for a complaint of diarrhea.    12/13/2020 nonlocalized acute abdominal pain with findings consistent with vascular compromise of the small bowel and concern for ischemia with possible closed-loop obstruction versus internal hernia and trace simple free fluid ascites.  Patient underwent exploratory laparotomy with lysis of adhesions    04/14/2021 patient seen in clinic by Dr. Tarri Glenn.  At that time he was following up for EGD with gastric ulcers on 12/12/2019.  Repeat EGD was recommended and performed with Z-line irregular at 39 cm from the incisors, small hiatal hernia, erythematous mucosa in the gastric body and otherwise normal.    04/29/2021 patient called to describe 5-6 liquid bowel movements per day with some abdominal cramping.  At that time was recommended he have stool studies including a culture, O&P, fecal calprotectin and fecal elastase.  Also told to minimize dairy and carbonated beverages as well as any sugar substitutes.  It does not look like the patient has completed stool studies.    Today, the patient tells me that he has been having occasional diarrhea since he had bowel surgery back in April of this year, but it seemed to come and go and Imodium would help but.  Most recently this has come back and been going on for a couple of weeks and taking his Imodium has not seemed to be quite as effective.  Does tell me that usually when he takes his Imodium he is able to have a solid stool.  Also describes some occasional abdominal cramping in various areas of his stomach and wonders if it is related to his "adhesions".  Tells me this pain never last long and is typically with changes in position.    Denies fever, chills, weight loss, blood in his stool or symptoms that awaken  him from sleep.  Past Medical History:  Diagnosis Date   Alcohol abuse    Anxiety    Cancer ([place])    GERD (gastroesophageal reflux disease)    Hyperlipidemia    Hypertension    Sleep apnea    w/CPAP   Thyroid disease     Past Surgical History:  Procedure Laterality Date   COLONOSCOPY     FASCIOTOMY Right 10/2019   LAPAROTOMY N/A 12/13/2020   Procedure: EXPLORATORY LAPAROTOMY, LYSIS OF ADHESIONS;  Surgeon: Leighton Ruff, MD;  Location: [facility];  Service: General;  Laterality: N/A;   SHOULDER ARTHROSCOPY WITH ROTATOR CUFF REPAIR Right 03/01/2019   Procedure: Right shoulder arthroscopy, subacromial decompression, distal clavicle resection, rotator cuff repair;  Surgeon: Justice Britain, MD;  Location: [facility];  Service: Orthopedics;  Laterality: Right;   THYROIDECTOMY     1987   UPPER GASTROINTESTINAL ENDOSCOPY      Current Outpatient Medications  Medication Sig Dispense Refill   acetaminophen (TYLENOL) 500 MG tablet Take 2 tablets (1,000 mg total) by mouth every 6 (six) hours as needed. (Patient not taking: No sig reported) 30 tablet 0   b complex vitamins tablet Take 1 tablet by mouth daily.     buPROPion (WELLBUTRIN XL) 150 MG 24 hr tablet Take 150 mg by mouth every morning.     carboxymethylcellulose (REFRESH PLUS) 0.5 % SOLN Place 1 drop into both eyes 2 (two) times daily as needed (dry eyes).  Coenzyme Q10 (COQ10) 100 MG CAPS Take 100 mg by mouth daily.      gabapentin (NEURONTIN) 600 MG tablet Take 600 mg by mouth at bedtime as needed (nerve pain).     irbesartan (AVAPRO) 150 MG tablet Take 150 mg by mouth daily.     levothyroxine (SYNTHROID, LEVOTHROID) 200 MCG tablet Take 100-200 mcg by mouth See admin instructions. Take 1 tablet (200 mcg) by mouth daily, except on Sundays 1/2 tablet ( 100 mcg)  0   LORazepam (ATIVAN) 1 MG tablet Take 1 mg by mouth 2 (two) times daily.     methocarbamol (ROBAXIN) 750 MG tablet Take 750 mg by mouth 2 (two) times daily.     Multiple  Vitamin (MULTIVITAMIN WITH MINERALS) TABS tablet Take 1 tablet by mouth daily. Men's 50+     Omega-3 Fatty Acids (FISH OIL) 1000 MG CAPS Take 1,000 mg by mouth daily.     omeprazole (PRILOSEC) 40 MG capsule TAKE ONE CAPSULE BY MOUTH TWICE A DAY 180 capsule 3   oxyCODONE (OXY IR/ROXICODONE) 5 MG immediate release tablet Take 5 mg by mouth every 6 (six) hours as needed for severe pain.     oxyCODONE (OXY IR/ROXICODONE) 5 MG immediate release tablet Take 1 tablet (5 mg total) by mouth every 6 (six) hours as needed for moderate pain, severe pain or breakthrough pain. 15 tablet 0   propranolol (INDERAL) 10 MG tablet Take 10 mg by mouth daily as needed (anxiety).     rosuvastatin (CRESTOR) 10 MG tablet Take 10 mg by mouth at bedtime.     sertraline (ZOLOFT) 100 MG tablet Take 100 mg by mouth daily.     thiamine (VITAMIN B-1) 100 MG tablet Take 100 mg by mouth daily.     traZODone (DESYREL) 50 MG tablet Take 50 mg by mouth at bedtime.      Current Facility-Administered Medications  Medication Dose Route Frequency Provider Last Rate Last Admin   0.9 %  sodium chloride infusion  500 mL Intravenous Once [place], MD        Allergies as of 05/14/2021 - Review Complete 04/14/2021  Allergen Reaction Noted   Sulfa antibiotics Rash 03/31/2011    Family History  Problem Relation Age of Onset   Cancer Mother    Anxiety disorder Mother    Diabetes Father    Hyperlipidemia Father    Hypertension Father    Cancer Maternal Grandmother    Cancer Paternal Grandmother    Heart disease Paternal Grandfather    Anxiety disorder Daughter    Colon cancer Neg Hx    Esophageal cancer Neg Hx    Stomach cancer Neg Hx    Rectal cancer Neg Hx     Social History   Socioeconomic History   Marital status: Single    Spouse name: Not on file   Number of children: Not on file   Years of education: Not on file   Highest education level: Not on file  Occupational History   Not on file  Tobacco Use    Smoking status: Former    Packs/day: 0.25    Years: 4.00    Pack years: 1.00    Types: Cigarettes    Quit date: 11/17/2018    Years since quitting: 2.4   Smokeless tobacco: Never   Tobacco comments:    4 per day  Vaping Use   Vaping Use: Never used  Substance and Sexual Activity   Alcohol use: Not Currently  Comment: recovery ETOH - last drink 2/5   Drug use: Not Currently   Sexual activity: Not on file  Other Topics Concern   Not on file  Social History Narrative   Works at [employer] to [employer] recently - currently separated   2 daughters - [place] and [place]   Social Determinants of Health   Financial Resource Strain: Not on file  Food Insecurity: Not on file  Transportation Needs: Not on file  Physical Activity: Not on file  Stress: Not on file  Social Connections: Not on file  Intimate Partner Violence: Not on file    Review of Systems:    Constitutional: No weight loss, fever or chills Cardiovascular: No chest pain Respiratory: No SOB Gastrointestinal: See HPI and otherwise negative   Physical Exam:  Vital signs: BP 118/66   Pulse 62   Ht '6\' 2"'$  (1.88 m)   Wt 215 lb 2 oz (97.6 kg)   BMI 27.62 kg/m    Constitutional:   Pleasant Caucasian male appears to be in NAD, Well developed, Well nourished, alert and cooperative Respiratory: Respirations even and unlabored. Lungs clear to auscultation bilaterally.   No wheezes, crackles, or rhonchi.  Cardiovascular: Normal S1, S2. No MRG. Regular rate and rhythm. No peripheral edema, cyanosis or pallor.  Gastrointestinal:  Soft, nondistended, nontender. No rebound or guarding. Normal bowel sounds. No appreciable masses or hepatomegaly. Psychiatric:  Demonstrates good judgement and reason without abnormal affect or behaviors.  RELEVANT LABS AND IMAGING: CBC    Component Value Date/Time   WBC 11.2 (H) 12/15/2020 0359   RBC 4.10 (L) 12/15/2020 0359   HGB 13.9 12/15/2020 0359   HCT 40.2  12/15/2020 0359   PLT 299 12/15/2020 0359   MCV 98.0 12/15/2020 0359   MCH 33.9 12/15/2020 0359   MCHC 34.6 12/15/2020 0359   RDW 13.0 12/15/2020 0359   LYMPHSABS 1.8 12/13/2020 1831   MONOABS 0.8 12/13/2020 1831   EOSABS 0.0 12/13/2020 1831   BASOSABS 0.0 12/13/2020 1831    CMP     Component Value Date/Time   NA 139 12/16/2020 0450   K 3.5 12/16/2020 0450   CL 105 12/16/2020 0450   CO2 26 12/16/2020 0450   GLUCOSE 121 (H) 12/16/2020 0450   BUN 7 (L) 12/16/2020 0450   CREATININE 0.96 12/16/2020 0450   CALCIUM 9.0 12/16/2020 0450   PROT 8.1 12/13/2020 1831   ALBUMIN 5.0 12/13/2020 1831   AST 26 12/13/2020 1831   ALT 34 12/13/2020 1831   ALKPHOS 56 12/13/2020 1831   BILITOT 1.0 12/13/2020 1831   GFRNONAA >60 12/16/2020 0450   GFRAA >60 02/26/2019 1205    Assessment: 1.  Diarrhea: Off-and-on over the past 5 months ever since exploratory laparotomy due to bowel obstruction; consider IBS versus dumping versus infectious cause 2.  Generalized abdominal pain: In various spots of his abdomen typically with movement; consider relation to adhesions versus lBS  Plan: 1.  Patient has stool studies pending and more that he has to return according to him.  Explained that we will wait on results of this to discuss further therapies. 2.  It could be that patient's occasional pain with changes in position is related to adhesions after recent surgery, as long as they are fleeting and he is not experiencing any nausea, vomiting or constipation and there likely not much to worry about.  Could also represent IBS which we discussed briefly. 3.  Recommend patient start  the probiotics he has at home once daily for the next 3 to 4 weeks. 4.  Patient can continue Imodium as needed. 5.  Did discuss that he could require a colonoscopy if the change in bowel habits continues and stool studies are unrevealing.  His last was in 2016 in [place] per him and was normal. 6.  Patient to follow in clinic  per recommendations after stool studies.  Ellouise Newer, PA-C [facility] 05/14/2021, 1:23 PM  Cc: Shon Baton, MD

## 2021-05-14 NOTE — Progress Notes (Signed)
Reviewed and agree with management plans. Low threshold for colonoscopy. Patient has previously reported to me that his last colonoscopy was at age 65.   Vernon Gaertner L. Tarri Glenn, MD, MPH

## 2021-05-18 LAB — GI PROFILE, STOOL, PCR

## 2021-06-01 ENCOUNTER — Telehealth: Payer: Self-pay | Admitting: Gastroenterology

## 2021-06-01 NOTE — Telephone Encounter (Signed)
Endoscopy and Pathology results from [facility] Procedures performed by Dr. Lacey Jensen September 2017  EGD: inlet patch, mild gastritis, small gastric polyps, EG junction biopsies with intestinal metaplasia, benign fundic gland polyp. Repeat EGD in 3 years.   Colon: 5 small polyps (3 adenomas, 1 hyperplastic, 1 benign tissue), AVM  Surveillance colonoscopy recommended in 5 years

## 2021-06-04 NOTE — Telephone Encounter (Signed)
Updated EGD recall to reflect change from 36yr recall to 39yr recall per Dr. Tarri Glenn request. Called pt to schedule colonoscopy per her request as well. LVM requesting returned call.

## 2021-06-05 NOTE — Telephone Encounter (Signed)
SECOND ATTEMPT: ° °LVM requesting returned call. °

## 2021-06-08 ENCOUNTER — Ambulatory Visit: Payer: 59 | Admitting: Pulmonary Disease

## 2021-06-08 NOTE — Telephone Encounter (Signed)
FINAL ATTEMPT:  LVM requesting returned call. Letter has also been mailed. Refer to letters.

## 2021-07-15 ENCOUNTER — Encounter: Payer: 59 | Admitting: Gastroenterology

## 2021-07-30 ENCOUNTER — Ambulatory Visit (HOSPITAL_COMMUNITY)
Admission: EM | Admit: 2021-07-30 | Discharge: 2021-07-30 | Disposition: A | Discharge status: Home / Self Care | Payer: Medicare Other | Attending: Psychiatry | Admitting: Psychiatry

## 2021-07-30 DIAGNOSIS — F1010 Alcohol abuse, uncomplicated: Secondary | ICD-10-CM

## 2021-07-30 DIAGNOSIS — Z79899 Other long term (current) drug therapy: Secondary | ICD-10-CM | POA: Insufficient documentation

## 2021-07-30 DIAGNOSIS — F419 Anxiety disorder, unspecified: Secondary | ICD-10-CM | POA: Insufficient documentation

## 2021-07-30 NOTE — ED Provider Notes (Signed)
Behavioral Health Urgent Care Medical Screening Exam  Patient Name: Vernon Fowler MRN: [id number] Date of Evaluation: 07/30/21 Chief Complaint:   Diagnosis:  Final diagnoses:  Alcohol abuse    History of Present illness: Vernon Fowler is a 65 y.o. male. Patient presents voluntarily to [facility] behavioral health for walk-in assessment.    [name] states "I have been sober for almost 2 years but I relapsed 2 months ago on alcohol."  He reports he "stopped drinking last night at 8 PM."  He discussed cessation of alcohol use with primary care provider who prescribed Ativan 0.5 mg twice daily x4 days.  Patient did take Ativan 0.5 mg as prescribed x4 days however he continued to use alcohol, approximately 1 bottle of wine per day throughout the 4 days.  [name] reports he has history of using alcohol for many years however he completed a residential substance use treatment program at [facility] 2 years ago and then attended [facility] and worked with a sponsor.  He reports he "stopped working the program" approximately 2 months ago when he relapsed.  He is committed to his sobriety, seeking detox today, then plans to return to [facility].  At this time he would prefer not to engage in residential substance use treatment as he is expecting a grandchild in the upcoming days.  [name] has been diagnosed with anxiety and is followed by Dr. Chucky May.  He is currently prescribed lorazepam 1 mg twice daily to address anxiety.  He reports he has used this medication more than directed and cannot have it refilled as his medication is "not due yet."  He reports he spoke with his outpatient psychiatry prescriber who "counted the ones (lorazepam)  primary care prescribed."  He is not currently linked with outpatient counseling.  Patient is assessed face-to-face by nurse practitioner. He is seated in assessment area, no acute distress.  He is alert and oriented, pleasant and cooperative during  assessment.  He presents with euthymic mood, congruent affect. He denies suicidal and homicidal ideations. He denies history of suicide attempts, denies history of self-harm.  He contracts verbally for safety with this Probation officer.  He has normal speech and behavior.  He denies both auditory and visual hallucinations.  Patient is able to converse coherently with goal-directed thoughts and no distractibility or preoccupation.  He denies paranoia.  Objectively there is no evidence of psychosis/mania or delusional thinking.  Vernon Fowler resides in [place] with his girlfriend.  He denies access to weapons.  He is recently retired.  Patient endorses average sleep and appetite.  He denies substance use aside from alcohol.  Patient offered support and encouragement.  Patient gives verbal consent to seek substance use treatment on his behalf.  Clinical social worker did locate an availability for detox at [facility] in [place].  Patient reports he will follow-up at [facility] in [place] today.   Psychiatric Specialty Exam  Presentation  General Appearance:Casual; Appropriate for Environment  Eye Contact:Good  Speech:Clear and Coherent; Normal Rate  Speech Volume:Normal  Handedness:Right   Mood and Affect  Mood:Euthymic  Affect:Appropriate; Congruent   Thought Process  Thought Processes:Coherent; Goal Directed; Linear  Descriptions of Associations:Intact  Orientation:Full (Time, Place and Person)  Thought Content:Logical; WDL    Hallucinations:None  Ideas of Reference:None  Suicidal Thoughts:No  Homicidal Thoughts:No data recorded  Sensorium  Memory:Immediate Good; Recent Good; Remote Good  Judgment:Good  Insight:Fair   Executive Functions  Concentration:Good  Attention Span:Good  [place]  Psychomotor Activity  Psychomotor Activity:Normal   Assets  Assets:Communication Skills; Desire for Improvement; Financial  Resources/Insurance; Housing; Intimacy; Leisure Time; Physical Health; Resilience; Social Support   Sleep  Sleep:Good  Number of hours: No data recorded  No data recorded  Physical Exam: Physical Exam Vitals and nursing note reviewed.  Constitutional:      Appearance: Normal appearance. He is well-developed and normal weight.  HENT:     Head: Normocephalic and atraumatic.     Nose: Nose normal.  Cardiovascular:     Rate and Rhythm: Normal rate.  Pulmonary:     Effort: Pulmonary effort is normal.  Musculoskeletal:        General: Normal range of motion.     Cervical back: Normal range of motion.  Skin:    General: Skin is warm and dry.  Neurological:     Mental Status: He is alert and oriented to person, place, and time.  Psychiatric:        Attention and Perception: Attention and perception normal.        Mood and Affect: Mood and affect normal.        Speech: Speech normal.        Behavior: Behavior normal. Behavior is cooperative.        Thought Content: Thought content normal.        Cognition and Memory: Cognition and memory normal.        Judgment: Judgment normal.   Review of Systems  Constitutional: Negative.   HENT: Negative.    Eyes: Negative.   Respiratory: Negative.    Cardiovascular: Negative.   Gastrointestinal: Negative.   Genitourinary: Negative.   Musculoskeletal: Negative.   Skin: Negative.   Neurological: Negative.   Endo/Heme/Allergies: Negative.   Psychiatric/Behavioral:  Positive for substance abuse.   Blood pressure (!) 143/97, pulse 72, temperature 98 F (36.7 C), temperature source Oral, resp. rate 18, height 6\' 2"  (1.88 m), weight 215 lb (97.5 kg), SpO2 99 %. Body mass index is 27.6 kg/m.  Musculoskeletal: Strength & Muscle Tone: within normal limits Gait & Station: normal Patient leans: N/A   [facility] Discharge Disposition for Follow up and Recommendations: Based on my evaluation the patient does not appear to have an emergency  medical condition and can be discharged with resources and follow up care in outpatient services for Medication Management and Individual Therapy Patient reviewed with Dr. Serafina Mitchell. Follow-up with established outpatient psychiatry. Follow-up with substance use treatment, [facility] in [place].    Lucky Rathke, FNP 07/30/2021, 1:01 PM

## 2021-07-30 NOTE — Discharge Instructions (Addendum)

## 2021-07-30 NOTE — BH Assessment (Signed)
Pt reports relapsing 2 months ago after being sober for a year. Pt reports he was going to [facility] and has a sponsor. Pt reports drinking 1.5 bottle of wine on average. Pt reports symptoms of feeling sick, having shakes and diarrhea. No hx of seizures. Pt reports PCP referred him here and gave him a 4 day supply of  ativan .5mg  also being prescribed by his psychiatrist. Pt reports he is out of meds. Pt is is looking for detox program. Pt denies SI, HI, AVH and denies using any other drugs.   Pt is routine.

## 2021-07-30 NOTE — ED Notes (Signed)
Pt discharged with resources in hand for follow up care. Verbalized understanding of all discharge instructions reviewed on AVS. Escorted to lobby with property in hand. Safety maintained.

## 2021-08-04 ENCOUNTER — Telehealth (HOSPITAL_COMMUNITY): Payer: Self-pay | Admitting: Licensed Clinical Social Worker

## 2021-08-04 NOTE — Telephone Encounter (Signed)
Client completed detox at [facility] in [place] ([facility]rium detox.) Clt requesting engagement with [facility] for additional support. Client reports relapse on alcohol after some time sober. Clinician explained abstiance based program and need to speak with prescribers about options for non-controlled substances for anxiety and pain management as clt is currently prescribed ativan and oxycodone. Client in agreement, has meeting with [facility] medical pain management doctor on 08/05/21 and will contact PCP for alternatives to ativan if cannot manage anxiety as clt noted alcohol used for self medicating anxiety. Client is in contact with sponsor and plans to return to attending [facility] [place] meetings. Client requested intake be scheduled for following week as his daughter is being induced with his first grandchild this week. Client will follow up with any additional questions.

## 2021-08-06 ENCOUNTER — Telehealth (HOSPITAL_COMMUNITY): Payer: Self-pay | Admitting: Internal Medicine

## 2021-08-06 NOTE — BH Assessment (Signed)
Care Management - Follow Up Discharges   Writer attempted to make contact with patient today and was unsuccessful.  Writer left a HIPPA compliant voice message.   Per chart review, patient followed up with [facility] in [place] on 07-30-21 and on 08-04-21 wtih Micheline Chapman, LCSW

## 2021-08-14 ENCOUNTER — Ambulatory Visit (HOSPITAL_COMMUNITY): Payer: Medicare Other | Admitting: Licensed Clinical Social Worker

## 2021-09-10 ENCOUNTER — Encounter: Payer: Self-pay | Admitting: Gastroenterology

## 2021-10-09 ENCOUNTER — Ambulatory Visit (HOSPITAL_COMMUNITY): Payer: Medicare Other | Admitting: Licensed Clinical Social Worker

## 2021-11-02 ENCOUNTER — Telehealth (HOSPITAL_COMMUNITY): Payer: Self-pay | Admitting: Licensed Clinical Social Worker

## 2021-11-02 NOTE — Telephone Encounter (Signed)
The therapist has a call transferred to him from Mr. Vernon Fowler with [facility] in [place] saying that the client is being discharged and he is calling to refer him to the [facility]. He says that the first day that the client would be available for a CCA is on 11/04/21 so the therapist schedules him for a CCA on 11/04/21 at 1 p.m. EST. Mr. [name] says that the client has Medicare with a supplement.  ? ?[name], MA, LCSW, [place], LCAS ?

## 2021-11-04 ENCOUNTER — Other Ambulatory Visit: Payer: Self-pay

## 2021-11-04 ENCOUNTER — Ambulatory Visit (INDEPENDENT_AMBULATORY_CARE_PROVIDER_SITE_OTHER): Payer: Medicare Other | Admitting: Licensed Clinical Social Worker

## 2021-11-04 DIAGNOSIS — F1020 Alcohol dependence, uncomplicated: Secondary | ICD-10-CM

## 2021-11-04 NOTE — Plan of Care (Signed)
Client agrees to Treatment Plan ?
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
## 2021-11-04 NOTE — Plan of Care (Signed)
?  Problem: Vernon Fowler will report sobriety from alcohol and all controlled substances as evidenced by having no positive UDS for these substances while in SA IOP.  ?Goal: LTG: [name] WILL INCREASE COPING SKILLS TO PROMOTE LONG-TERM RECOVERY AND IMPROVE ABILITY TO PERFORM DAILY ACTIVITIES ?Outcome: Not Applicable ?Goal: STG: Promote adherence to treatment regimen ?Outcome: Not Applicable ?  ?Problem: Vernon Fowler will report sobriety from alcohol and all controlled substances as evidenced by having no positive UDS for these substances while in SA IOP.  ?Intervention: Vernon Fowler ON RELAPSE PREVENTION ?  ?

## 2021-11-06 ENCOUNTER — Other Ambulatory Visit: Payer: Self-pay

## 2021-11-06 ENCOUNTER — Other Ambulatory Visit (HOSPITAL_COMMUNITY): Payer: Medicare Other | Attending: Psychiatry | Admitting: Licensed Clinical Social Worker

## 2021-11-06 DIAGNOSIS — Z6372 Alcoholism and drug addiction in family: Secondary | ICD-10-CM | POA: Diagnosis not present

## 2021-11-06 DIAGNOSIS — E785 Hyperlipidemia, unspecified: Secondary | ICD-10-CM | POA: Diagnosis not present

## 2021-11-06 DIAGNOSIS — Z79899 Other long term (current) drug therapy: Secondary | ICD-10-CM | POA: Diagnosis not present

## 2021-11-06 DIAGNOSIS — F341 Dysthymic disorder: Secondary | ICD-10-CM | POA: Diagnosis not present

## 2021-11-06 DIAGNOSIS — G894 Chronic pain syndrome: Secondary | ICD-10-CM | POA: Diagnosis not present

## 2021-11-06 DIAGNOSIS — F1020 Alcohol dependence, uncomplicated: Secondary | ICD-10-CM | POA: Insufficient documentation

## 2021-11-06 DIAGNOSIS — Z85850 Personal history of malignant neoplasm of thyroid: Secondary | ICD-10-CM | POA: Insufficient documentation

## 2021-11-06 DIAGNOSIS — F1110 Opioid abuse, uncomplicated: Secondary | ICD-10-CM | POA: Diagnosis not present

## 2021-11-06 DIAGNOSIS — Z811 Family history of alcohol abuse and dependence: Secondary | ICD-10-CM | POA: Insufficient documentation

## 2021-11-06 DIAGNOSIS — F419 Anxiety disorder, unspecified: Secondary | ICD-10-CM | POA: Insufficient documentation

## 2021-11-06 DIAGNOSIS — E890 Postprocedural hypothyroidism: Secondary | ICD-10-CM | POA: Diagnosis not present

## 2021-11-06 DIAGNOSIS — I10 Essential (primary) hypertension: Secondary | ICD-10-CM | POA: Insufficient documentation

## 2021-11-06 NOTE — Progress Notes (Signed)
Daily Group Progress Note ? ?Program: CD IOP ? ? ?Group Time: 9 a.m. to 10:30 a.m. ? ?Type of Therapy: Process ? ?Topic: The therapist checked in with group members, assessed for SI/HI/psychosis and overall level of functioning. The therapist inquired about sobriety date and number of community support meetings attended since last session. The therapist and group members processed events that went well and any challenges to recovery. The therapist facilitated group processing primarily around post-acute withdrawal and how a lack of dopamine in this period of recovery can make it difficult for people in recovery to enjoy things and ways to get through this difficult period. The therapist explains why some people need residential treatment to get a certain level of brain recovery prior to attending OP treatment.   ? ? ?Group Time: 10:30 a.m. to 12 noon ? ?Type of Therapy: Psychoeducation and Process ? ?Topic: The therapist has the group members review the video, ?Addiction Requires Dishonesty? eliciting group members' feedback about how this video relates to their lives. The therapist uses a narrative therapy approach in discussing how their disease works to isolate them from other people and to not open up about challenging emotions. The therapist suggests that when they are having relationship issues with people in their lives that one option is to try and address this with this person directly. When group members are unable to think of any other options, the therapist illustrates for them how sharing about painful emotions and life events in Vernon Fowler Step meetings can help them to cope without having to use.  ? ? ?Summary: Vernon Fowler presents for his initial SA IOP group. He denies any SI/HI and rates his depression as ?1? and anxiety as ?4.? He admits to having struggled with some cravings to drink recently with the therapist explaining the biology behind his craving. He talks about having tried Vivitrol in pill form in  the past saying that it ?tore up? his ?stomach.? He was on the injectable noting that he did not see the point of it as it did not reduce his cravings but did eliminate the euphoria associated with drinking if he did drink such that he drank a little and stopped. The therapist notes that by eliminating the euphoria that it stopped his relapse from progressing which is harm reduction. He says that he can talk with the P.A. with the SA IOP about MAT which he is interested in doing. ? ?Vernon Fowler shares with group that he is a ?chronic relapse? and that he has been in treatment six times in the past four years. He says that he was in a treatment program in [place] who encouraged him to stat there and move to a half-way house, but he chose to fly home. On the plane he sat next to a woman who was drinking so ended up relapsing. ? ?When asked what he believes caused his most recent 18 months of sobriety to end, he concludes, ?loneliness and boredom.? He says that he retired a year ago and has nothing to do so now keeping an hourly chart or schedule. He also says that sports seem to be a trigger saying that his relapse started at a [employer]' game.  ? ?The therapist answers Vernon Fowler' questions about post-acute withdrawal and his questions about ?wet brain.? In talking about how dishonesty plays a part in addiction, Vernon Fowler says that has tended to engage in avoidance and shutting down in the past; however, the times that he did not lie to his partner about drinking  but was ?brutally honest? that it made things better.  ? ?He talks about being upset that his partner sent a text to his sister saying that Vernon Fowler is ?bullshitting again.? He recognizes that his history has caused her to not trust him but says that he prefers she had not sent this to his sister as he is trying to get his sister to trust him again. The therapist validates her need to talk; however, does suggest that it would likely be best for her to talk to a  therapist, go to [place], etc. The therapist models how he can assertively tell his partner this. He admits that she was threatening to leave him as of today and is going to look at a place, so he was thinking of drinking due to being overwhelmed by this. He sends her a text message and she responds by suggesting that she is not going to move out and asking that he ?not drink over? this. The therapist talks about how group members can talk to directly to their partners or significant others about their feelings; however, points out the benefits of opening up to people in meetings about difficult emotions and life situations.  ? ?At the conclusion of group, Vernon Fowler signs Releases of Information for his sister and partner noting that he will sign one at his therapist's office next week.  ? ?Progress Towards Goals: Vernon Fowler is new to group and reports attending two [place] meetings yesterday. He has thus far not gotten drunk over his issues with his partner.  ? ?UDS collected: No Results: No ? ?AA/NA attended?: Yes ? Sponsor?: Yes ? ? ?Vernon Phenix, MA, LCSW, [place], LCAS ?

## 2021-11-09 ENCOUNTER — Telehealth (HOSPITAL_COMMUNITY): Payer: Self-pay | Admitting: Licensed Clinical Social Worker

## 2021-11-09 ENCOUNTER — Other Ambulatory Visit: Payer: Self-pay

## 2021-11-09 ENCOUNTER — Other Ambulatory Visit (HOSPITAL_COMMUNITY): Payer: Medicare Other | Admitting: Licensed Clinical Social Worker

## 2021-11-09 DIAGNOSIS — F1020 Alcohol dependence, uncomplicated: Secondary | ICD-10-CM

## 2021-11-09 NOTE — Telephone Encounter (Signed)
The therapist receives a voicemail from [place] today, 11/09/21, at 1:26 p.m. EST asking that he contact the two person for whom he signed a Release of Information while at the same time asking that the therapist not disclose about his relapse yesterday so as to not worry them.  ? ?[name], MA, LCSW, [place], LCAS ? ?The therapist returns Masyn' call leaving a HIPAA-compliant voicemail asking that he call the therapist back about his earlier call today. ? ?[name], MA, LCSW, [place], LCAS ?

## 2021-11-09 NOTE — Progress Notes (Signed)
?Daily Group Progress Note ? ?Program: CD IOP ? ? ?Group Time: 9 a.m. to 10:30 a.m. ? ?Type of Therapy: Process ? ?Topic: The therapist checked in with group members, assessed for SI/HI/psychosis and overall level of functioning. The therapist inquired about sobriety date and number of community support meetings attended since last session. The therapist and group members processed events that went well and any challenges to recovery. The therapist facilitated group processing primarily around viewing relapses as an opportunity for learning vs. a failure. The therapist points out that when people relapse and beat up on themselves emotionally that this is counterproductive as they are not working on what they will do differently the next time when facing this trigger or how to avoid this same trigger in the future. The therapist also talks about traits or qualities members want to look for in regards to choosing a Sponsor. The therapist also shares information on medication-assisted treatment options for tobacco cessation and how patches can be gotten for free through [place].  ? ? ?Group Time: 10:30 a.m. to 12 noon ? ?Type of Therapy: Psychoeducation and Process ? ?Topic: The therapist has the group members complete and discuss the Make it Your Own Strategies that Help People with Recovery checklist identifying strategies that they are already using as well as strategies that they would like to try or develop further.  ? ? ?Summary: Vernon Fowler presents for group rating his depression as ?3? and anxiety as a ?7? with no SI or HI. He admits to relapsing yesterday saying that as a result of this that he is ?embarrassed? and ?afraid.? He says that he was at his wife's house, and she was cooking and that he impulsively drank the remainder of a bottle of cooking sherry that he saw in her house. He says that he did not really get drunk likely due to his high tolerance. When redirected to stop shaming himself and to focus on  what he learned from this relapse, he concludes that he is ?more fragile? than he ?thought? and that he cannot have alcohol in the house. On a positive note, he says that it is good that he did not do as he usually would have done in the past and gone to the store and kept drinking. He talks about how he has family who will be inviting him over to cookouts in the near future in which social drinking is occurring. Based on this experience, he is able to recognize that he is not in a place in recovery in which he can be around even social drinking. The therapist models how Vernon Fowler might assertively explain this to family in telling them that he cannot come to the cookout though wanting to as he needs to prioritize his recovery. When the therapist has a discussion with another group member about his Trazodone, Vernon Fowler mentions that he is on Trazodone; however, rather than leaving him too groggy the next day like this member that it does not really help to get him to sleep. Thus, the therapist suggests that Vernon Fowler many want to talk to his PCP about possibly increasing his Trazodone from his current dose of 50 mg. Vernon Fowler says that he, like another group member, finds that there is something about hiding his alcohol use that makes it more exciting so recognizes the need to be open about his desire to use so as to eliminate this effect. He says that he would like to quit smoking cigarettes. In discussing recovery strategies, he says  that the ?theme? is decreasing social isolation and becoming more active such as exercising more.  ? ? ?Progress Towards Goals: Vernon Fowler is honest in admitting his relapse yesterday and in making today his new sobriety date. He is very active in today's group discussion and says that he got at least two new things out of this meeting.  ? ?UDS collected: Yes Results: No ? ?AA/NA attended?: Yes ? ?Sponsor?: No ? ? ?[name], MA, LCSW, [place], LCAS ?

## 2021-11-09 NOTE — Telephone Encounter (Signed)
The therapist receives a return call from [place] and verifies his identity via three identifiers. Jeanclaude clarifies that he did not want the therapist to call his sister or his significant other; however, if they called, did not want the therapist telling them about his "little slip" from yesterday. At the same time, he says that they would like to come in at some point in the future for a family session with Marcello Moores.  ? ?The therapist explains that Tavari' wanting to limit information provided to his sister and significant other while signing a Release of Information and stating that he wants to be an "open book" are not completely congruent. ? ?The therapist indicates that he can definitely facilitate the family session; however, notes that such a session would likely be non-productive and/or counterproductive if one or more parties are censoring what information can or cannot be discussed. ? ?[name], MA, LCSW, [place], LCAS ?

## 2021-11-11 ENCOUNTER — Encounter (HOSPITAL_COMMUNITY): Payer: Self-pay | Admitting: Medical

## 2021-11-11 ENCOUNTER — Other Ambulatory Visit: Payer: Self-pay

## 2021-11-11 ENCOUNTER — Other Ambulatory Visit (HOSPITAL_BASED_OUTPATIENT_CLINIC_OR_DEPARTMENT_OTHER): Payer: Medicare Other | Admitting: Licensed Clinical Social Worker

## 2021-11-11 DIAGNOSIS — G894 Chronic pain syndrome: Secondary | ICD-10-CM

## 2021-11-11 DIAGNOSIS — F1110 Opioid abuse, uncomplicated: Secondary | ICD-10-CM

## 2021-11-11 DIAGNOSIS — F341 Dysthymic disorder: Secondary | ICD-10-CM

## 2021-11-11 DIAGNOSIS — Z6372 Alcoholism and drug addiction in family: Secondary | ICD-10-CM

## 2021-11-11 DIAGNOSIS — Z811 Family history of alcohol abuse and dependence: Secondary | ICD-10-CM

## 2021-11-11 DIAGNOSIS — E890 Postprocedural hypothyroidism: Secondary | ICD-10-CM

## 2021-11-11 DIAGNOSIS — E785 Hyperlipidemia, unspecified: Secondary | ICD-10-CM

## 2021-11-11 DIAGNOSIS — F419 Anxiety disorder, unspecified: Secondary | ICD-10-CM

## 2021-11-11 DIAGNOSIS — F1020 Alcohol dependence, uncomplicated: Secondary | ICD-10-CM | POA: Diagnosis not present

## 2021-11-11 DIAGNOSIS — I10 Essential (primary) hypertension: Secondary | ICD-10-CM

## 2021-11-11 MED ORDER — PREGABALIN 150 MG PO CAPS: 150.0000 mg | ORAL_CAPSULE | Freq: Three times a day (TID) | ORAL | 2 refills | Status: DC

## 2021-11-11 MED ORDER — BACLOFEN 10 MG PO TABS: 10.0000 mg | ORAL_TABLET | Freq: Three times a day (TID) | ORAL | 1 refills | Status: DC

## 2021-11-11 NOTE — Progress Notes (Signed)
Daily Group Progress Note ? ?Program: CD IOP ? ? ?Group Time: 9 a.m. to 12 p.m. ? ?Type of Therapy: Process ? ?Topic: The therapist checks in with group members, assesses for SI/HI/psychosis and overall level of functioning. The therapist inquires about sobriety date and number of community support meetings attended since last session. The therapist and group members process events that went well and any challenges to recovery. The therapist facilitates group processing primarily around the benefits of smoking cessation including information on how this impacts recovery from addiction, overall health, and how people metabolize medications. The therapist also facilitates group discussion around things that people do not believe that can do effectively if they stop using drugs and alcohol. ? ?Summary: Nakeem presents today rating his depression as a ?2? and anxiety as a ?4? which is an improvement from last group. He denies any suicidal or homicidal ideation. He wants to work on not drinking and cutting back on smoking cigarettes. He says that he is on Wellbutrin and Zoloft and that his PCP referred him to a different psychiatrist as the old one put him on a benzodiazepine which was contraindicated in his case. The therapist educates Miklo on tobacco cessation and how tobacco can cause people to metabolize caffeine and certain medications differently. He says that he told his partner about his relapse as he is trying to practice ?rigorous honesty? which the therapist reinforces. He says that when he gave his sister an update on his progress that she told him that it was ?excellent news;? however, his daughter ?chimed in? saying that he has been an ?emotional burden? on is loved ones. This is the same daughter who was addicted to benzodiazepines but has three years of sobriety. His partner, who is also in recovery, did not receive the news of his recent slip well as evidenced by giving him a bit of the silent  treatment. The therapist notes that it is odd that the persons in recovery seemed to be less reinforcing of his honesty than family members who are not. He says that the one daughter who does not speak with him using cannabis regularly; however, does not see herself as having an addiction the same as [place] and his other daughter. The therapist and other group members suggest that just because she uses marijuana does not make her any different than they are. Huriel says that he asked his fianc? to not give reports to his sister and if needing to talk to come in for a session with this therapist and/or start attending Al-Anon. The therapist questions the reason that Burle is providing so much information to his family regarding his recovery with Steve noting that he is trying to rebuild trust. The therapist points out that Harald' reassuring them may be doing the opposite given that many in recovery stop showing progress through what they are saying but more by what they are doing. Another group member points out that family are likely keeping tabs on what is happening with him without him saying anything. In discussing the things that people worry they will not be able to do or will not be able to do the same if they stop using, Talvin notes that when he used marijuana that he perceived how he was around his daughters as being the ?fun dad.? He also talks about how it was difficult to have sex when sober. He says that he continues to attend two meetings per day.   ? ? ?Progress Towards Goals: Noel has  attended three [facility] IOP meetings; however, is making progress in regards to working on practicing ?rigorous honesty.? He says that the P.A. prescribed him some anti-craving medication and is working on smoking cessation as well.  ? ?UDS collected: No Results: No ? ?AA/NA attended?: Yes ? ?Sponsor?: Yes ? ? ? ?[name], MA, LCSW, [place], LCAS ? ?
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
## 2021-11-13 ENCOUNTER — Other Ambulatory Visit (HOSPITAL_COMMUNITY): Payer: Medicare Other | Admitting: Licensed Clinical Social Worker

## 2021-11-13 ENCOUNTER — Other Ambulatory Visit: Payer: Self-pay

## 2021-11-13 DIAGNOSIS — F1020 Alcohol dependence, uncomplicated: Secondary | ICD-10-CM

## 2021-11-13 NOTE — Progress Notes (Signed)
Daily Group Progress Note ? ?Program: CD IOP ? ? ?Group Time: 9 a.m. to 10:30 a.m. ? ?Type of Therapy: Process ? ?Topic: The therapist checks in with group members, assesses for SI/HI/psychosis and overall level of functioning. The therapist inquires about sobriety date and number of community support meetings attended since last session. The therapist and group members process events that went well and any challenges to recovery. The therapist facilitates group processing primarily around the issue of enabling as well as how unresolved resentments can be hazardous to one's recovery. The therapist also responds to one group member's stated that he used to drink ?just beer? educating group members about the fact that one twelve-ounce beer has the same amount of alcohol and is just as harmful as one ounce of liquor. The therapist addresses the problem of cell phone usage such as texting when in group.  ? ? ?Group Time: 10:30 a.m. to 12 noon ? ?Type of Therapy: Psychoeducation and Process ? ?Topic: The therapist has the group members watch the video, ?Relapse Prevention Plan and Early Warning Signs? and additionally have the read the NA Just for Today Meditation for today relating to fear and discusses how this connects to the concept of emotional relapse which precedes mental relapse. The therapist elicits feedback from the group and relates this material to past and present circumstances in their lives. ? ? ?Summary: Vernon Fowler presents for group rating his depression as ?2? and his anxiety as a ?4? with no SI or HI. He attended one AA meeting Wednesday and two meetings yesterday. He met with his new therapist noting that it went well and that she was concerned about him having a schedule and staying busy. He says that they discussed his doing more service work which would involve attending different meetings. He says that she has her own Release of Information and that she is sending a PDF of this to him to complete so  that she can communicate with this therapist. He shares some concerns he had when he started taking the Lyrica and Baclofen prescribed by the P.A. noting that he felt dizzy. He says that the dizziness has improved. He also says that he feels less anxious with the therapist informing him that Lyrica can have an anti-anxiety effect. Vernon Fowler says that he spoke with his doctor and that his Trazodone dosage was increased. During the discussion about enabling, Vernon Fowler talks about his daughter's history with Xanax noting that one two different occasions that he drove to be with her at the hospital after she had a seizure and paid for things; however, looking back at it, he recognizes that she did not go to treatment after either of these episodes. Also, he says that she was asking him for money for various things which she was really using on the Xanax. Now that she is reportedly in recovery, she does not want to talk to Vernon Fowler until he has some sustained sobriety; however, he notes that when he talked to her about 4 weeks ago that she had attended a concert and was apparently drunk on alcohol. On the topic of resentment, Vernon Fowler talks about how he used to harbor resentment towards his ex and how he was encouraged to let go of the resentment so eventually sent her a letter apologizing for his part in what transpired between them. The therapist observes that based on what Vernon Fowler is sharing that he might possibly still harbor some unresolved resentment towards her to which he agrees. In talking about  relapse, he recounts being in treatment and admitting to staff that he was thinking about having a cocktail at the [facility] when he got out. They told him that sharing this would likely cause this not to happen, but he went to the [facility] and acted out this fantasy when he got out. The therapist notes that Arby was in the mental phase of relapse questioning what the emotional relapse was before this. Vernon Fowler says that he does not  know; however, says that he was going through his divorce at the time and working on it while in treatment with his Substance Abuse Counselor questioning if he should not wait to do this. So, Vernon Fowler recognizes that he was experiencing emotional upset regarding the divorce and began to engage in the mental relapse fantasy which led to physical relapse. Vernon Fowler says that the Just for Today reading today was God-sent as he is currently struggling with a lot of fear such as fear about the financial markets, fear about having enough retirement money, Social research officer, government. The therapist normalizes people feeling afraid, depressed, and anxious at times as part of the human condition and encourages sharing these feelings with supportive others such that fear does not turn into catastrophizing.  ? ?Progress Towards Goals: Vernon Fowler reports the same sobriety date and is reportedly attending meetings. His UDS next week will confirm if he is maintaining his sobriety as reported.  ? ?UDS collected: No Results: Yes; UDS from 11/09/21 is positive for ETOH ? ?AA/NA attended?: Yes ? ?Sponsor?: Yes ? ? ? ?Vernon Phenix, MA, LCSW, [place], LCAS ?

## 2021-11-16 ENCOUNTER — Other Ambulatory Visit: Payer: Self-pay

## 2021-11-16 ENCOUNTER — Other Ambulatory Visit (HOSPITAL_COMMUNITY): Payer: Medicare Other | Admitting: Licensed Clinical Social Worker

## 2021-11-16 DIAGNOSIS — F1020 Alcohol dependence, uncomplicated: Secondary | ICD-10-CM

## 2021-11-16 NOTE — Progress Notes (Signed)
Daily Group Progress Note ? ?Program: CD IOP ? ? ?Group Time: 9 a.m. to 10:30 a.m. ? ?Type of Therapy: Process ? ?Topic: The therapist checks in with group members, assesses for SI/HI/psychosis and overall level of functioning. The therapist inquires about sobriety date and number of community support meetings attended since last session. The therapist and group members process events that went well and any challenges to recovery. The therapist facilitates group processing primarily around identifying and avoiding triggers and practicing assertiveness in relation to conflict-resolution and making amends.  ? ? ?Group Time: 10:30 a.m. to 12 noon ? ?Type of Therapy: Psychoeducation and Process ? ?Topic: The therapist covers Topic 1 ?What is Recovery and What Helps People in the Recovery Process?? and Topic 2 ?Exploring Changes You Would Like to Make in Your Life? from Enhanced Illness Management and Recovery in addition to Topic 7 on Thoughts, Emotions, and Behaviors from the MATRIX model. The therapist elicits group members definition of ?recovery? in addition to areas of their lives in which they would like to make changes. The therapist begins to introduces how to use thought stopping techniques in addition to beginning to introduce the A,B,C's of Rational-Emotive therapy in relation to the MATRIX topic on thoughts, emotions, and behaviors. ? ? ?Summary: Adhvik presents for group rating his depression as ?4? and his anxiety as a ?5? with no SI or HI. He attended three [place] meetings yesterday. Jabar continues to report some dizziness from his medications and is advised by the P.A. to take each at different times to see what medication may be causing this. Jaeshawn says that he has another relapse on Saturday. He received call from his youngest daughter inviting him to come see his grandchild. He drove to his 1st wife's house in [place] and ended up picking up two small, single-serving bottles of wine and drank them in  the bathroom. Christophere notes that all his past relapses have occurred in ?social situations.? He initially views this relapse as having happened impulsively. The therapist helps Tige to identify the trigger for this relapse which was not ?feeling comfortable? in his ?skin? in a social situation when not drinking. Also, he expected this to be a ?safe? social situation; however, his daughter's husband had brought two six packs of beer and his ex-wife had the wine bottles. He says that after drinking the two bottles that he felt comfortable in the social situation noting that his daughter commented that he did not seem ?present? during a previous gathering when he did not drink. The therapist helps Klayton in generating an action plan to avoid being blind-sided by a social situation in the future which involves assuming that no impromptu social situation is ?safe? as alcohol can be there. Avrian says that he can call his Sponsor on the way to such a gathering, and the therapist models for Keilyn how he can explain to his daughter and others that he will be a little uncomfortable in social gatherings as he is in early recovery and how he is not to be around alcohol unsupervised if social drinking is taking place at such a gathering. He is also encouraged to avoid gatherings with alcohol, when at all possible, given that ?will power? will not work with impaired functioning of the prefrontal cortex. Daveion says that he eats well when not drinking but wants to start walking daily and getting more exercise. He is happy with his living situation; however, continues to worry about finances and his money lasting throughout his  retirement. He is able to identify situations in the past in which he catastrophized and engaged in irrational thinking.  ? ?Progress Towards Goals: Jolan drank two glasses of wine on Saturday but did not buy alcohol on the way home as he would have done in the past and processed this slip with his  Sponsor, in [employer], and in group today. He now has an action plan so as to not use in situations such as the one this weekend and like the one before with the cooking sherry.  ? ?UDS collected: Yes Results: No ? ?AA/NA attended?: Yes ? ?Sponsor?: Yes ? ?[name], MA, LCSW, [place], LCAS ?

## 2021-11-18 ENCOUNTER — Encounter (HOSPITAL_COMMUNITY): Payer: Self-pay | Admitting: Licensed Clinical Social Worker

## 2021-11-18 ENCOUNTER — Other Ambulatory Visit: Payer: Self-pay

## 2021-11-18 ENCOUNTER — Other Ambulatory Visit (HOSPITAL_COMMUNITY): Payer: Medicare Other | Admitting: Licensed Clinical Social Worker

## 2021-11-18 DIAGNOSIS — I10 Essential (primary) hypertension: Secondary | ICD-10-CM

## 2021-11-18 DIAGNOSIS — F1110 Opioid abuse, uncomplicated: Secondary | ICD-10-CM

## 2021-11-18 DIAGNOSIS — F1020 Alcohol dependence, uncomplicated: Secondary | ICD-10-CM | POA: Diagnosis not present

## 2021-11-18 DIAGNOSIS — E890 Postprocedural hypothyroidism: Secondary | ICD-10-CM

## 2021-11-18 DIAGNOSIS — Z6372 Alcoholism and drug addiction in family: Secondary | ICD-10-CM

## 2021-11-18 DIAGNOSIS — G894 Chronic pain syndrome: Secondary | ICD-10-CM

## 2021-11-18 DIAGNOSIS — F341 Dysthymic disorder: Secondary | ICD-10-CM

## 2021-11-18 DIAGNOSIS — Z811 Family history of alcohol abuse and dependence: Secondary | ICD-10-CM

## 2021-11-18 DIAGNOSIS — E785 Hyperlipidemia, unspecified: Secondary | ICD-10-CM

## 2021-11-18 DIAGNOSIS — F419 Anxiety disorder, unspecified: Secondary | ICD-10-CM

## 2021-11-18 NOTE — Addendum Note (Signed)
Addended by: Dara Hoyer on: 11/18/2021 03:22 PM ? ? Modules accepted: Orders, Level of Service ? ?

## 2021-11-18 NOTE — Progress Notes (Signed)
? ?[facility] ?Follow-up Outpatient CDIOP ?Date: 11/18/2021 ? ?Admission Date: 11/06/2021 ? ?Sobriety date:11/15/2021 ? ?Subjective: " I dont know why I cant do what I know to do.I could probably teach some of the classes." ? ?HPI :CD IOP provider FU: This Charls'initial CD IOP Provider FU. He has begun using Lyrica and Baclofen.Initially he was taking both medications simultaneously and was having significant dizziness. It was suggersted he set an alternating schedule for these drugs which has been doing the past 2 days. He reports a vague sense of dicomfort/?dizziness that does not interfere with his ability to function.He will continue taking them this way. ?He also remarks that he doesnt know why he cant follow the directions that keep him from taking the 1st drink?He is finding the IOP Group counseling very helpful especially since he drank again last Saturday ?He voices no other concerns or complaints.  ? ?Review of Systems: ?Psychiatric: ?Agitation: No overt signs ?Hallucination: No ?Depressed Mood: Never-has always been anxious since early childhood ?Insomnia: Has Tazodone -no complaint today ?Hypersomnia: No ?Altered Concentration: No ?Feels Worthless: No ?Grandiose Ideas: No ?Belief In Special Powers: No ?New/Increased Substance Abuse: 11/14/2021 ?Compulsions: Ongoing for alcohol use ? ?Neurologic: ?Headache: No ?Seizure: No ?Paresthesias: No ? ?Current Medications: ?amoxicillin-clavulanate 875-125 MG tablet ?Commonly known as: AUGMENTIN Take by mouth.  ?b complex vitamins tablet Take 1 tablet by mouth daily.  ?baclofen 10 MG tablet ?Commonly known as: LIORESAL Take 1 tablet (10 mg total) by mouth 3 (three) times daily.  ?buPROPion 150 MG 24 hr tablet ?Commonly known as: WELLBUTRIN XL Take 150 mg by mouth every morning.  ?carboxymethylcellulose 0.5 % Soln ?Commonly known as: REFRESH PLUS Place 1 drop into both eyes 2 (two) times daily as needed (dry eyes).  ?CoQ10 100 MG Caps Take 100 mg by mouth  daily.  ?doxycycline 100 MG tablet ?Commonly known as: VIBRA-TABS Take by mouth.  ?Fish Oil 1000 MG Caps Take 1,000 mg by mouth daily.  ?gabapentin 600 MG tablet ?Commonly known as: NEURONTIN Take 600 mg by mouth at bedtime as needed (nerve pain).  ?hydrOXYzine 25 MG capsule ?Commonly known as: VISTARIL Take by mouth.  ?irbesartan 150 MG tablet ?Commonly known as: AVAPRO Take 150 mg by mouth daily.  ?levothyroxine 175 MCG tablet ?Commonly known as: SYNTHROID Take by mouth.  ?multivitamin with minerals Tabs tablet Take 1 tablet by mouth daily. Men's 50+  ?naltrexone 50 MG tablet ?Commonly known as: DEPADE Take by mouth.  ?omeprazole 40 MG capsule ?Commonly known as: PRILOSEC TAKE ONE CAPSULE BY MOUTH TWICE A DAY  ?pregabalin 150 MG capsule ?Commonly known as: Lyrica Take 1 capsule (150 mg total) by mouth in the morning, at noon, and at bedtime.  ?propranolol 10 MG tablet ?Commonly known as: INDERAL Take 10 mg by mouth daily as needed (anxiety).  ?rosuvastatin 10 MG tablet ?Commonly known as: CRESTOR Take 10 mg by mouth at bedtime.  ?sertraline 100 MG tablet ?Commonly known as: ZOLOFT Take 100 mg by mouth daily.  ?thiamine 100 MG tablet ?Commonly known as: Vitamin B-1 Take 100 mg by mouth daily.  ?traZODone 100 MG tablet ?Commonly known as: DESYREL Take 100 mg by mouth at bedtime.  ? ?Vital Signs ? ?Mental Status Examination  ?Appearance: ?Alert: Yes ?Attention: good  ?Cooperative: Yes ?Eye Contact: Good ?Speech: Clear and coherent ?Psychomotor Activity: Normal ?Memory/Concentration: Normal/intact ?Oriented: person, place, time/date and situation ?Mood: Euthymic ?Affect: Appropriate and Congruent ?Thought Processes and Associations: Coherent and Intact ?Fund of Knowledge: Good ?Thought Content: WDL ?  Insight: Good ?Judgement: Good ? ?UDS:11/09/2021: ?Alcohol ?Gabapentin ?Hydroxybupropion ?Trazone  ?Sertraline ?Nicotene ([place]) ? ?PDMP: 2023 3/15 Lyrica 2/05 OXycodone 5 mg #120 ? ?Diagnosis:  ?Alcohol use disorder,  severe, dependence ([place]) ?Opioid abuse, episodic ([place]) ?Chronic pain syndrome ?Alcoholism in family member ?Dysthymic disorder ?Chronic anxiety ?Dysfunctional family due to alcoholism ?H/O total thyroidectomy ?Essential hypertension ?Hyperlipidemia, unspecified hyperlipidemia type ? ?Assessment: Struggling with abstinence in very early recovery after relapse;Lacking insight ?Medications seem to be tolerated after adjusting dose schedule ? ?Treatment Plan:Per admission. FU 1 week ? ? ? ? ? ?Darlyne Russian, PA-C Patient ID: Vernon Fowler, male   DOB: 1956/07/01, 66 y.o.   MRN: [id number] ? ?
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
## 2021-11-20 ENCOUNTER — Other Ambulatory Visit: Payer: Self-pay

## 2021-11-20 ENCOUNTER — Other Ambulatory Visit (HOSPITAL_COMMUNITY): Payer: Medicare Other | Admitting: Licensed Clinical Social Worker

## 2021-11-20 DIAGNOSIS — F1020 Alcohol dependence, uncomplicated: Secondary | ICD-10-CM
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
## 2021-11-23 ENCOUNTER — Telehealth (HOSPITAL_COMMUNITY): Payer: Self-pay | Admitting: Licensed Clinical Social Worker

## 2021-11-23 ENCOUNTER — Ambulatory Visit (HOSPITAL_COMMUNITY): Payer: Medicare Other | Admitting: Medical

## 2021-11-23 NOTE — Telephone Encounter (Signed)
Vernon Fowler leaves two different voicemails for this therapist indicating that he is cancelled [facility] IOP for today as she has a dental appointment today, 11/23/21 at 10 a.m. to follow-up about a pulled tooth and to see the dentist about the throat pain he is experiencing which he believes is due to the "fissure" in his tongue.  ? ?[name], MA, LCSW, [facility], LCAS ?11/23/2021 ?

## 2021-11-25 ENCOUNTER — Other Ambulatory Visit (HOSPITAL_COMMUNITY): Payer: Medicare Other | Admitting: Licensed Clinical Social Worker

## 2021-11-25 ENCOUNTER — Other Ambulatory Visit: Payer: Self-pay

## 2021-11-25 DIAGNOSIS — F1020 Alcohol dependence, uncomplicated: Secondary | ICD-10-CM | POA: Diagnosis not present
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
## 2021-11-26 ENCOUNTER — Encounter (HOSPITAL_COMMUNITY): Payer: Self-pay

## 2021-11-26 NOTE — Progress Notes (Deleted)
? ?Established Patient Office Visit ? ?Subjective:  ?Patient ID: Vernon Fowler, male    DOB: [birth year]  Age: 66 y.o. MRN: [id number] ? ?CC: No chief complaint on file. ? ? ?HPI ?[name] presents for *** ? ?Past Medical History:  ?Diagnosis Date  ? Alcohol abuse   ? Anxiety   ? Cancer [facility])   ? GERD (gastroesophageal reflux disease)   ? Hyperlipidemia   ? Hypertension   ? Sleep apnea   ? w/CPAP  ? Thyroid disease   ? ? ?Past Surgical History:  ?Procedure Laterality Date  ? COLONOSCOPY    ? FASCIOTOMY Right 10/2019  ? LAPAROTOMY N/A 12/13/2020  ? Procedure: EXPLORATORY LAPAROTOMY, LYSIS OF ADHESIONS;  Surgeon: Leighton Ruff, MD;  Location: [facility];  Service: General;  Laterality: N/A;  ? SHOULDER ARTHROSCOPY WITH ROTATOR CUFF REPAIR Right 03/01/2019  ? Procedure: Right shoulder arthroscopy, subacromial decompression, distal clavicle resection, rotator cuff repair;  Surgeon: Justice Britain, MD;  Location: [facility];  Service: Orthopedics;  Laterality: Right;  ? THYROIDECTOMY    ? 1987  ? UPPER GASTROINTESTINAL ENDOSCOPY    ? ? ?Family History  ?Problem Relation Age of Onset  ? Cancer Mother   ? Anxiety disorder Mother   ? Diabetes Father   ? Hyperlipidemia Father   ? Hypertension Father   ? Cancer Maternal Grandmother   ? Cancer Paternal Grandmother   ? Heart disease Paternal Grandfather   ? Anxiety disorder Daughter   ? Colon cancer Neg Hx   ? Esophageal cancer Neg Hx   ? Stomach cancer Neg Hx   ? Rectal cancer Neg Hx   ? ? ?Social History  ? ?Socioeconomic History  ? Marital status: Single  ?  Spouse name: Not on file  ? Number of children: Not on file  ? Years of education: Not on file  ? Highest education level: Not on file  ?Occupational History  ? Not on file  ?Tobacco Use  ? Smoking status: Former  ?  Packs/day: 0.25  ?  Years: 4.00  ?  Pack years: 1.00  ?  Types: Cigarettes  ?  Quit date: 11/17/2018  ?  Years since quitting: 3.0  ? Smokeless tobacco: Never  ? Tobacco comments:  ?  4 per day  ?Vaping Use  ?  Vaping Use: Never used  ?Substance and Sexual Activity  ? Alcohol use: Not Currently  ?  Comment: recovery ETOH - last drink 2/5  ? Drug use: Not Currently  ? Sexual activity: Not on file  ?Other Topics Concern  ? Not on file  ?Social History Narrative  ? 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   ? Moved to [place] recently - currently separated  ? 2 daughters - [place] and [place]  ? ?Social Determinants of Health  ? ?Financial Resource Strain: Not on file  ?Food Insecurity: Not on file  ?Transportation Needs: Not on file  ?Physical Activity: Not on file  ?Stress: Not on file  ?Social Connections: Not on file  ?Intimate Partner Violence: Not on file  ? ? ?Outpatient Medications Prior to Visit  ?Medication Sig Dispense Refill  ? amoxicillin-clavulanate (AUGMENTIN) 875-125 MG tablet Take by mouth.    ? b complex vitamins tablet Take 1 tablet by mouth daily.    ? baclofen (LIORESAL) 10 MG tablet Take 1 tablet (10 mg total) by mouth 3 (three) times daily. 90 tablet 1  ? carboxymethylcellulose (REFRESH PLUS) 0.5 % SOLN Place 1 drop into both  eyes 2 (two) times daily as needed (dry eyes).     ? Coenzyme Q10 (COQ10) 100 MG CAPS Take 100 mg by mouth daily.     ? doxycycline (VIBRA-TABS) 100 MG tablet Take by mouth.    ? hydrOXYzine (VISTARIL) 25 MG capsule Take by mouth.    ? irbesartan (AVAPRO) 150 MG tablet Take 150 mg by mouth daily.    ? levothyroxine (SYNTHROID) 175 MCG tablet Take by mouth.    ? Multiple Vitamin (MULTIVITAMIN WITH MINERALS) TABS tablet Take 1 tablet by mouth daily. Men's 50+    ? naltrexone (DEPADE) 50 MG tablet Take by mouth.    ? Omega-3 Fatty Acids (FISH OIL) 1000 MG CAPS Take 1,000 mg by mouth daily.    ? omeprazole (PRILOSEC) 40 MG capsule TAKE ONE CAPSULE BY MOUTH TWICE A DAY 180 capsule 3  ? pregabalin (LYRICA) 150 MG capsule Take 1 capsule (150 mg total) by mouth in the morning, at noon, and at bedtime. 90 capsule 2  ? propranolol (INDERAL) 10 MG tablet Take 10 mg by mouth daily as needed (anxiety).     ? rosuvastatin (CRESTOR) 10 MG tablet Take 10 mg by mouth at bedtime.    ? thiamine (VITAMIN B-1) 100 MG tablet Take 100 mg by mouth daily.    ? traZODone (DESYREL) 100 MG tablet Take 100 mg by mouth at bedtime.    ? ?Facility-Administered Medications Prior to Visit  ?Medication Dose Route Frequency Provider Last Rate Last Admin  ? 0.9 %  sodium chloride infusion  500 mL Intravenous Once [place], MD      ? ? ?Allergies  ?Allergen Reactions  ? Sulfa Antibiotics Rash  ?  Fixed based skin reaction  ? ? ?ROS ?Review of Systems ? ?  ?Objective:  ?  ?Physical Exam ? ?There were no vitals taken for this visit. ?Wt Readings from Last 3 Encounters:  ?07/30/21 215 lb (97.5 kg)  ?05/14/21 215 lb 2 oz (97.6 kg)  ?04/14/21 210 lb (95.3 kg)  ? ? ? ?Health Maintenance Due  ?Topic Date Due  ? COVID-19 Vaccine (1) Never done  ? Pneumonia Vaccine 56+ Years old (1 - PCV) Never done  ? HIV Screening  Never done  ? Hepatitis C Screening  Never done  ? Zoster Vaccines- Shingrix (1 of 2) Never done  ? COLONOSCOPY (Pts 45-54yr Insurance coverage will need to be confirmed)  Never done  ? INFLUENZA VACCINE  03/30/2021  ? ? ?There are no preventive care reminders to display for this patient. ? ?No results found for: TSH ?Lab Results  ?Component Value Date  ? WBC 11.2 (H) 12/15/2020  ? HGB 13.9 12/15/2020  ? HCT 40.2 12/15/2020  ? MCV 98.0 12/15/2020  ? PLT 299 12/15/2020  ? ?Lab Results  ?Component Value Date  ? NA 139 12/16/2020  ? K 3.5 12/16/2020  ? CO2 26 12/16/2020  ? GLUCOSE 121 (H) 12/16/2020  ? BUN 7 (L) 12/16/2020  ? CREATININE 0.96 12/16/2020  ? BILITOT 1.0 12/13/2020  ? ALKPHOS 56 12/13/2020  ? AST 26 12/13/2020  ? ALT 34 12/13/2020  ? PROT 8.1 12/13/2020  ? ALBUMIN 5.0 12/13/2020  ? CALCIUM 9.0 12/16/2020  ? ANIONGAP 8 12/16/2020  ? ?No results found for: CHOL ?No results found for: HDL ?No results found for: LHilltop?No results found for: TRIG ?No results found for: CHOLHDL ?No results found for: HGBA1C ? ?   ?Assessment & Plan:  ? ?Problem List Items Addressed  This Visit   ?None ? ? ?No orders of the defined types were placed in this encounter. ? ? ?Follow-up: No follow-ups on file.  ? ? ?Darlyne Russian, PA-C ?

## 2021-11-26 NOTE — Progress Notes (Signed)
? ?[place] ?Follow-up Outpatient CDIOP ?Date: 11/25/2021 ? ?Admission Date:11/11/2021 ? ?Sobriety date:11/15/2021 ? ?Subjective: " I have absolutrly no cravings.Naltrexone did not work like (Baclofen) I drank on it" ? ?HPI : CD IOP Provider FU ?Vernon Fowler is seen for FU on his response to medicationsand his sobriety.Last week he was having some fatigue and dizziness with combination of Baclofen and Lyrica, taking them at same time. Since the he has seperated dosing and is not having any symptoms as before. He has a problem with dry mouth though which may be aggravated by new meds. He developed a tongue fissure and saw his dentist who has him on Biotin for this. ?To his amazement he is also not having cravings  nor is he thinking about drinking which surprises him. He is keeping up with AA meetings 2/day and he is talking to his sponsor daily. ?He continues to experience anxiety but manages so far with current meds. He is going to stop Vistaril as it isnt helping and may be contributing to his dry mouth. ?He is having no issues with opiate withdrawal. ? ?Review of Systems: ?Psychiatric: ?Agitation: No ?Hallucination: No ?Depressed Mood: Chronic dysphoria/anxiety ?Insomnia: Rx Trazodone ?Hypersomnia: No ?Altered Concentration: No ?Feels Worthless: Ch[place] esteem issues related to growing up,in alcoholic family ?Grandiose Ideas: No ?Belief In Special Powers: No ?New/Increased Substance Abuse: No ?Compulsions: Early withdrawal ? ?Neurologic: ?Headache: No ?Seizure: No ?Paresthesias: No ? ?Current Medications: ?amoxicillin-clavulanate 875-125 MG tablet ?Commonly known as: AUGMENTIN Take by mouth.  ?b complex vitamins tablet Take 1 tablet by mouth daily.  ?baclofen 10 MG tablet ?Commonly known as: LIORESAL Take 1 tablet (10 mg total) by mouth 3 (three) times daily.  ?carboxymethylcellulose 0.5 % Soln ?Commonly known as: REFRESH PLUS Place 1 drop into both eyes 2 (two) times daily as needed (dry eyes).  ?CoQ10 100  MG Caps Take 100 mg by mouth daily.  ?doxycycline 100 MG tablet ?Commonly known as: VIBRA-TABS Take by mouth.  ?Fish Oil 1000 MG Caps Take 1,000 mg by mouth daily.  ?hydrOXYzine 25 MG capsule ?Commonly known as: VISTARIL Take by mouth.  ?irbesartan 150 MG tablet ?Commonly known as: AVAPRO Take 150 mg by mouth daily.  ?levothyroxine 175 MCG tablet ?Commonly known as: SYNTHROID Take by mouth.  ?multivitamin with minerals Tabs tablet Take 1 tablet by mouth daily. Men's 50+  ?naltrexone 50 MG tablet ?Commonly known as: DEPADE Take by mouth.  ?omeprazole 40 MG capsule ?Commonly known as: PRILOSEC TAKE ONE CAPSULE BY MOUTH TWICE A DAY  ?pregabalin 150 MG capsule ?Commonly known as: Lyrica Take 1 capsule (150 mg total) by mouth in the morning, at noon, and at bedtime.  ?propranolol 10 MG tablet ?Commonly known as: INDERAL Take 10 mg by mouth daily as needed (anxiety).  ?rosuvastatin 10 MG tablet ?Commonly known as: CRESTOR Take 10 mg by mouth at bedtime.  ?thiamine 100 MG tablet ?Commonly known as: Vitamin B-1 Take 100 mg by mouth daily.  ?traZODone 100 MG tablet ?Commonly known as: DESYREL Take 100 mg by mouth at bedtime.  ? ? ? ? ?Mental Status Examination  ?Appearance: ?Alert: Yes ?Attention: good  ?Cooperative: Yes ?Eye Contact: Good ?Speech: Clear and coherent ?Psychomotor Activity: Normal ?Memory/Concentration: Normal/intact ?Oriented: person, place, time/date and situation ?Mood: Euthymic ?Affect: Appropriate and Congruent ?Thought Processes and Associations: Coherent and Intact ?Fund of Knowledge: Good ?Thought Content: WDL ?Insight: Good ?Judgement: Good ? ?UDS:11/16/21 Alcohol+ plus rx meds ? ?PDMP: 2/6 120 Oxycodone 5 mg 3/15 Lyrica rx ? ?Diagnosis:  ?  Alcohol use disorder, severe, dependence ([place]) ?Opioid abuse, episodic ([place]) ?Chronic pain syndrome ?Alcoholism in family member ?Dysthymic disorder ?Chronic anxiety ?Dysfunctional family due to alcoholism ?H/O total thyroidectomy ?Essential  hypertension ?Hyperlipidemia, unspecified hyperlipidemia  ? ?Assessment: Making progress in early recovery ? ?Treatment Plan:Continue Per admission ? ? ? ? ?Darlyne Russian, PA-C  ?

## 2021-11-27 ENCOUNTER — Other Ambulatory Visit (HOSPITAL_COMMUNITY): Payer: Medicare Other | Admitting: Licensed Clinical Social Worker

## 2021-11-27 DIAGNOSIS — F1020 Alcohol dependence, uncomplicated: Secondary | ICD-10-CM | POA: Diagnosis not present

## 2021-11-27 NOTE — Progress Notes (Signed)
Daily Group Progress Note ? ?Program: CD [facility] ? ? ?Group Time: 9 a.m. to 12 p.m.  ? ?Type of Therapy: Process ? ?Topic: The therapist checks in with group members, assesses for SI/HI/psychosis and overall level of functioning. The therapist inquires about sobriety date and number of community support meetings attended since last session. The therapist and group members process events that went well and any challenges to recovery. The therapist facilitates group processing around a number of topics including the differences between passive, passive-aggressive, assertive, and aggressive interactions modeling some of these for group members. The therapist discusses what health boundaries and the difference between an enmeshed family and a disengaged family. He introduces group to how to do a transactional analysis of an interpersonal interaction noting that adult to adult interactions are desired while parent-child or child-parent actions are not. He talks about the importance of self-care in recovery and the concept of ?switching addictions? in addition to explaining what is meant by ?process addictions.?  ? ? ?Summary: Vernon Fowler presents for group rating his depression as a ?2? and anxiety as a ?3? with no SI or HI. He says that he has attended two meeting since he was last a group. In response to another group member's issues about her Sponsor, Vernon Fowler notes that he is happy that he has a Publishing copy who ?pushes? him. When this other group member talks about having been able to go into a convenience store and not buy her usual alcoholic beverage, Vernon Fowler says, ?It's a good feeling when you walk away.? He also validates that ?early sobriety is tough.?  ? ?He says that his sister has not called him but sent a text to his significant other who responded with a text about Vernon Fowler attendance, etc. After another group member talks about how to get her spouse to attend a meeting with this therapist, Vernon Fowler asks about how he might  approach his sister about attending a meeting. The therapist notes that Vernon sister does not live with him and questions the reason that he would want her to attend a conjoint session with this therapist. Additionally, the therapist observes that Vernon Fowler already signed a release of information for his sister to speak with this therapist; however, she has never contacted this therapist which seems to suggest that she has no need to speak with him.  ? ?The other group member reiterates what was told to [facility] in a previous group about demonstrating his recovery via his actions and not his words. The therapist says that AA is a ?program of attraction and not promotion? which seems to reinforce this approach.  ? ?When the therapist introduces the concept of parent, adult, and child as it relates to transactional analysis, Vernon Fowler recognizes that he is in the child role as it relates to his sister who he is putting in the role of a parent from whom he wants to receive approval.  ? ? ?Progress Towards Goals: Vernon Fowler reports continued sobriety today and is regularly an active and engaged member in group.  ? ?UDS collected: No Results: No ? ?AA/NA attended?: Yes ? ?Sponsor?: Yes ? ? ? ?[name], MA, LCSW, [facility], LCAS ?11/27/2021 ?

## 2021-11-30 ENCOUNTER — Encounter (HOSPITAL_COMMUNITY): Payer: Medicare Other

## 2021-11-30 ENCOUNTER — Telehealth (HOSPITAL_COMMUNITY): Payer: Self-pay | Admitting: Licensed Clinical Social Worker

## 2021-11-30 NOTE — Telephone Encounter (Signed)
The therapist receives a voicemail from [place] on 11/29/21 at 12:39 p.m. informing him that he will likely not be attending [facility] IOP on 11/30/21 as he believes that he has food poisoning.  ? ?He says that he will attend if feeling better on 11/30/21. ? ?[name], MA, LCSW, [facility], LCAS ?11/30/2021 ?

## 2021-12-02 ENCOUNTER — Telehealth (HOSPITAL_COMMUNITY): Payer: Self-pay | Admitting: Licensed Clinical Social Worker

## 2021-12-02 ENCOUNTER — Encounter (HOSPITAL_COMMUNITY): Payer: Medicare Other

## 2021-12-02 NOTE — Telephone Encounter (Signed)
The therapist receives a voicemail from [place] on 12/01/21 at 6:58 p.m. EST saying that he will likely not make it to group today as he is still having stomach problems. ? ?He adds that he does not believe that his stomach problems are food poisoning-related as his partner now has the same thing. ? ?Vernon Phenix, MA, LCSW, [facility], LCAS ?12/02/2021 ?

## 2021-12-04 ENCOUNTER — Other Ambulatory Visit (HOSPITAL_COMMUNITY): Payer: Medicare Other | Attending: Psychiatry | Admitting: Licensed Clinical Social Worker

## 2021-12-04 DIAGNOSIS — I10 Essential (primary) hypertension: Secondary | ICD-10-CM | POA: Insufficient documentation

## 2021-12-04 DIAGNOSIS — G894 Chronic pain syndrome: Secondary | ICD-10-CM | POA: Insufficient documentation

## 2021-12-04 DIAGNOSIS — F1110 Opioid abuse, uncomplicated: Secondary | ICD-10-CM | POA: Insufficient documentation

## 2021-12-04 DIAGNOSIS — Z6372 Alcoholism and drug addiction in family: Secondary | ICD-10-CM | POA: Diagnosis not present

## 2021-12-04 DIAGNOSIS — E785 Hyperlipidemia, unspecified: Secondary | ICD-10-CM | POA: Insufficient documentation

## 2021-12-04 DIAGNOSIS — F1020 Alcohol dependence, uncomplicated: Secondary | ICD-10-CM | POA: Insufficient documentation

## 2021-12-04 DIAGNOSIS — F341 Dysthymic disorder: Secondary | ICD-10-CM | POA: Insufficient documentation

## 2021-12-04 NOTE — Progress Notes (Signed)
Daily Group Progress Note ? ?Program: CD IOP ? ? ?Group Time: 9 a.m. to 12 p.m.  ? ?Type of Therapy: Process ? ?Topic: The therapist checks in with group members, assesses for SI/HI/psychosis and overall level of functioning. The therapist inquires about sobriety date and number of community support meetings attended since last session. The therapist and group members process events that went well and any challenges to recovery. The therapist normalizes struggles related to spirituality suggesting that it is especially difficult when things in one's life are not going well and that this is a lifelong process. The therapist shares a NA daily reading entitled, ?The Value of the Past? in relation to the 4th step suggesting how things about which persons in recovery feel the most shame can actually end up being beneficial in helping a newcomer down the road. The therapist notes that when a person in recovery decides to tell his or her story that the person does not need to go into specific detail regarding personal matters. The therapist also points out that people are not defined by what they do in their disease. As the group members share a number of recovery-related events such as a cookout at [facility] next Saturday, the therapist informs group members that they are free to write these events on the large chalk board in the group room so that new group members will be aware of them as well. Towards the end of group, the therapist focuses on group members concerns about what damage they may have caused to their bodies via their alcohol use and a discussion on ways to eat a healthy diet which foods that are a natural source of thiamine.  ? ? ?Summary: [name] presents for group rating his depression as a ?1? and anxiety as a ?2? down from a 2 and 3 respectively at his last group. He denies any SI or HI. He brings both the therapist and the other group member a copy of Joneen Caraway Brand's book, Recovery Freedom From Our  Addictions. He also brings some copies of the [employer] from [place] asking if he can leave copies in the group room to which the therapist answers in the affirmative. He talks about one of the [place] articles discussing the topic of ?learning how to feel? as people often are not able to identify feelings when first leaving treatment which was the case for [facility] as well. He says that he is feeling ?good mentally? and thinking of drinking lately has not been a ?compulsion? or ?obsession? as it ?used to be.? He notes that he often struggles with trying to maintain a ?spiritual connection? with his Higher Power as he sometimes feels like ?no one's there.? In discussing another group member's fear of doing her 4th step, he says that one of the issues that is not focused on enough in this step is ?regrets.? [name] talks about regrets that he has such as engaging in what he calls an ?affair? with a whom that he had shortly after he separated from his wife. [name] says that when he did his 4th step that his Sponsor put him at ease by sharing a very bad thing that he did while actively in addiction putting a lot of trust in [place] to not share this with others. He says that what he would want for his sister to know if she were to ever attend a family meeting is that addiction is a disease as she often alludes to the ?willpower? and ?discipline? that  he had when running marathons not understanding how the disease of addition impairs the part of the brain responsible for willpower. He also shares a number of other AA resources such as ?[place].?  ? ? ?Progress Towards Goals: [name] reports continued sobriety which is confirmed by his most recent UDS. His mood is better with a decrease in his SUDS for both depression and anxiety.  ? ?UDS collected: Yes Results: Yes; most recent UDS was negative for alcohol ? ?AA/NA attended?: Yes ? ?Sponsor?: Yes ? ? ? ?[name], MA, LCSW, [facility],  LCAS ?12/04/2021 ? ?

## 2021-12-07 ENCOUNTER — Other Ambulatory Visit (HOSPITAL_COMMUNITY): Payer: Medicare Other | Admitting: Licensed Clinical Social Worker

## 2021-12-07 DIAGNOSIS — F1020 Alcohol dependence, uncomplicated: Secondary | ICD-10-CM

## 2021-12-07 NOTE — Progress Notes (Signed)
Daily Group Progress Note ? ?Program: CD IOP ? ? ?Group Time: 9 a.m. to 12 p.m.  ? ?Type of Therapy: Process ? ?Topic: The therapist introduces two new members to group today while working to establish good group rapport. The therapist checks in with group members, assesses for SI/HI/psychosis and overall level of functioning. The therapist inquires about sobriety date and number of community support meetings attended since last session. The therapist and group members process events that went well and any challenges to recovery. In relation to recent alcohol use by both the new group members, the therapist facilitates group discussion on addiction as an inheritable, brain-based disease and how age of first use is an important factor. The therapist also leads a group discussion on the reason that Twelve Step attendance is important and the concept of avoiding people, places and things. The therapist also talking about how a person's recovery can be psychologically threatening to his or her still addicted friends who in turn may actually attempt to sabotage a person's recovery. The therapist also discusses what constitutes "normal drinking" vs. "Binge drinking" in addiction to explaining the serious, potentially life-threatening risks involved in withdrawing abruptly from alcohol and benzodiazepines. ? ? ?Summary: Vernon Fowler presents for group rating his depression as a ?1? and anxiety as a ?3?Marland Kitchen He denies any SI or HI. Vernon Fowler is very active in welcoming the two new members to group openly sharing his history of alcohol use and past efforts at getting sober. He says that she has been "fighting it" for "8 years" saying that he "did not start drinking alcoholically" until "10 years ago." He says that his age of first use was around age 13. [name]'s statement regarding his not drinking "alcoholically" until 10 years ago is challenged by the therapist and leads into a group discussion regarding what drinking looks like on a  national level in the country and what constitutes "normal drinking." Vernon Fowler is eventually able to realize that he was engaging in binge drinking prior to 10 years ago when he started experiencing more serious symptoms such as alcohol-related blackouts. In response to a newcomer stating that her partner will "keep an eye" on her at an upcoming event with drinking, Vernon Fowler notes that he attempted a similar thing; however, in spite of his partner's efforts, he still managed to drink.  ? ?Progress Towards Goals: Vernon Fowler reports continued sobriety which is confirmed by his most recent UDS. H ? ?UDS collected: Yes Results: No ? ?AA/NA attended?: Yes ? ?Sponsor?: Yes ? ? ? ?[name], MA, LCSW, [facility], LCAS ?12/07/2021 ? ?

## 2021-12-09 ENCOUNTER — Other Ambulatory Visit (HOSPITAL_COMMUNITY): Payer: Medicare Other | Admitting: Licensed Clinical Social Worker

## 2021-12-09 DIAGNOSIS — F1020 Alcohol dependence, uncomplicated: Secondary | ICD-10-CM

## 2021-12-09 NOTE — Progress Notes (Signed)
Daily Group Progress Note ? ?Program: CD IOP ? ? ?Group Time: 9 a.m. to 12 p.m.  ? ?Type of Therapy: Process and Psychoeducational ? ?Topic: The therapist checks in with group members, assesses for SI/HI/psychosis and overall level of functioning. The therapist inquires about sobriety date and number of community support meetings attended since last session. The therapist and group members process events that went well and any challenges to recovery. The therapist facilitates group processing around a number of topics including the brain-based reason that many alcoholics have a strong sugar craving in early recovery and how a person can go about picking a sponsor and how male members can avoid predators. The therapist shows a short video related to sugar cravings and some theories regarding how lower levels of Serotonin may be a factor related to alcohol dependence. The therapist educates members on alcohol withdrawal and the fact that it will become progressively worse over time and the reason that people can have alcohol-withdrawal-related seizures. The therapist uses one group member's recent relapse to illustrate how a relapse can be an opportunity for learning discussing how to remove triggers and avoid all or nothing thinking. The therapist emphasizes that research shows that ?recovery takes place in fellowship? and that it is not something that a person can do by himself. He discusses the reason that establishing a routine and being responsible about showing up on time, etc. are important elements of recovery. The therapist shares the daily NA reading entitled, ?The Big Picture? facilitating a discussion related to eliminating self-defeating self-talk and humility in accepting that one needs help from others.  ? ? ?Summary: Vernon Fowler presents for group rating his depression as a ?1? and anxiety as a ?3? with no SI or HI. He has continued to attend two meetings per day. Vernon Fowler is mostly supportive of the two new  group members. He shares about the different types of AA meeting and explains what the ?Big Book? is. In addition to encouraging the new member to attend meetings, he also encourages them to get a Sponsor ?pretty soon.? When prompted by the therapist, he answers questions regarding how one should go about choosing a Publishing copy and discusses his experience with his current Sponsor as well as ones he has had in the past. Vernon Fowler says that one of his former Sponsors got onto [employer] once for calling him at 8:01 when he had told Vernon Fowler to call him at 8. This leads to a discussion regarding how people in active addiction's lives lack structure and the importance of building this structure into their lives. He lets the new members know that they can find meetings via [employer]. When another member talks about how he opened up to his supervisor about his alcohol problem only to be shocked to learn that his supervisor is in recovery with 7 years of sobriety, Vernon Fowler informs the new member that this is what the program refers to as the ?God shot.? Vernon Fowler also discusses how difficult it is for persons new to recovery to pick up the phone and reach out to people before they drink saying that AA says that the ?phone weighs one-hundred pounds.? At the conclusion of group, when Vernon Fowler is asked what he got out of group today; he says that what another member shared about her recent relapse is ?identical? to what has happened to him many times and that he must learn to ?surrender.?  ? ? ?Progress Towards Goals: Vernon Fowler reports continued sobriety today and is regularly an active and  engaged member in group.  ? ?UDS collected: No Results: No ? ?AA/NA attended?: Yes ? ?Sponsor?: Yes ? ?[name], MA, LCSW, [facility], LCAS ?12/09/2021 ?

## 2021-12-11 ENCOUNTER — Other Ambulatory Visit (HOSPITAL_COMMUNITY): Payer: Medicare Other | Admitting: Licensed Clinical Social Worker

## 2021-12-11 DIAGNOSIS — F1020 Alcohol dependence, uncomplicated: Secondary | ICD-10-CM

## 2021-12-11 NOTE — Progress Notes (Signed)
Daily Group Progress Note ? ?Program: CD IOP ? ? ?Group Time: 9 a.m. to 12 p.m.  ? ?Type of Therapy: Process and Psychoeducational  ? ?Topic: The therapist checks in with group members, assesses for SI/HI/psychosis and overall level of functioning. The therapist inquires about sobriety date and number of community support meetings attended since last session. The therapist and group members process events that went well and any challenges to recovery. The therapist facilitates group processing answers group members questions about a number of illicit substances such as what is Kratom, what class of drug are bath salts, etc. The therapist facilitates a group discussion around what a group member should do regarding a wedding to which he was invited in which the groom is a drinking buddy, and the bride is his niece. The therapist suggests that what one person in recovery may not determine to be a high-risk situation may be extremely high risk for another person. The therapist explains what is meant about the saying that if a person is at risk of using that he needs to step over his mother's body, if need be, to get to a meeting. The therapist educates group members on what is meant by the ?rescue fantasy? and why engaging in it is a risk to one's sobriety. The therapist educates group members on what is meant by a person being ?co-dependent? in relation to a person in active addiction who is the dependent. It is noted that people's using buddies may feel threatened by a person's recovery and consciously or unconsciously work to sabotage the person's recovery; however, so may the co-dependent person in his life. The therapist discusses with group how many people in active addiction believe in the myth that they are a better version of themselves when using such as funnier, more creative, etc. when the reverse is true. The therapist makes the observation towards the end of group that group members are engaging in  telling war stories discussing the reason that such stories are discouraged in recovery meetings and how people in active addiction can sometimes use humor as a form of denial to gloss over an event in which they felt guilt or shame over their actions while using.  ? ? ?Summary: Vernon Fowler presents for group rating his depression as a ?0? and anxiety as a ?2? with no SI or HI. He brings a Big Book for one of the new group members. He provides support to another group member who has decided that he cannot attend his niece's wedding as it is too high-risk for him drinking with Vernon Fowler sharing how he had to do the same thing with his nephew's wedding. Vernon Fowler opens up about how his father died of COVID only a few months after another group member's mother died of COVID and that COVID prevented many people from being able to grieve properly. In talking about co-dependency, Vernon Fowler recounts how his ex-wife suggested that they drink a glass of wine to celebrate after she found out that he had attended three [place] meetings with the therapist noting that she was apparently doing so to undermine his sobriety. In talking about the rescue fantasy, Vernon Fowler recognizes that he is currently helping another guy who is most likely using this money for drugs or alcohol such that Vernon Fowler is inadvertently ?subsidizing? his habit so needs to stop doing so.  ? ? ?Progress Towards Goals: Vernon Fowler reports continued sobriety today and is regularly an active and engaged member in group.  ? ?UDS collected: No Results:  Yes; negative for ETOH ? ?AA/NA attended?: Yes ? ?Sponsor?: Yes ? ?[name], MA, LCSW, [facility], LCAS ?12/11/2021 ?

## 2021-12-14 ENCOUNTER — Encounter (HOSPITAL_COMMUNITY): Payer: Medicare Other

## 2021-12-14 ENCOUNTER — Telehealth (HOSPITAL_COMMUNITY): Payer: Self-pay | Admitting: Licensed Clinical Social Worker

## 2021-12-14 NOTE — Telephone Encounter (Signed)
The therapist receives one voicemail from [place] from yesterday, 12/13/21, saying that he lost his cell phone and that he has a new one coming today in the morning but has to be home to sign for it so may be late for group. ? ?He does not show for group and the therapist receives another voicemail from him today at 3:40 p.m. saying that his new phone did not arrive until 10:30 a.m. today and that he had to take his partner to a follow-up medical appointment but would be in group on Wednesday. ? ?[name], MA, LCSW, [facility], LCAS ?12/14/2021 ?

## 2021-12-16 ENCOUNTER — Other Ambulatory Visit: Payer: Self-pay | Admitting: Gastroenterology

## 2021-12-16 ENCOUNTER — Other Ambulatory Visit (HOSPITAL_COMMUNITY): Payer: Medicare Other | Admitting: Licensed Clinical Social Worker

## 2021-12-16 DIAGNOSIS — F1020 Alcohol dependence, uncomplicated: Secondary | ICD-10-CM

## 2021-12-16 NOTE — Progress Notes (Signed)
Daily Group Progress Note ? ?Program: CD IOP ? ? ?Group Time: 9 a.m. to 12 p.m.  ? ?Type of Therapy: Process and Psychoeducational  ? ?Topic: The therapist checks in with group members, assesses for SI/HI/psychosis and overall level of functioning. The therapist inquires about sobriety date and number of community support meetings attended since last session. The therapist and group members process events that went well and any challenges to recovery. The therapist reads the AA Daily Reflection asking what group members take this reflection to mean to them. The therapist facilitates group processing around several topics such how emotions are neither right or wrong and can be used to help people effectively navigate through life but that thoughts can be either rational or irrational. The therapist uses one group member's issues with one of her using friends who has responded badly to her efforts to practice self-care to illustrate the point that relationships with change as a result of a person getting sober and that as people get further into recovery that their relationships will become healthier. The therapist discusses how it is often a dead-end in trying to change someone's preconceived notions or prejudices about [place] and best to respond with the statement, ?I am sorry you choose to believe___.? The therapist talks about the importance of having non-using friends and sober supports and shows a Vernon Fowler Talk which is focused on post-traumatic growth and how to build physical, emotional, mental, and social resilience. The therapist continues Matrix handout RP1, ?Alcohol? having group members answer the question, ?What challenges have you faced in stopping drinking since you entered treatment?? ? ? ?Summary: Vernon Fowler presents for group rating his depression as a ?3? and anxiety as a ?3? with no SI or HI. He has continued to attend two, AA meetings per day. Vernon Fowler reiterates that when he first left treatment in the past that  he did not know how he was feeling. In response to another group member's anger and resentment towards her roommate, Vernon Fowler notes that in [place], ?resentment is the #1 offender.? He talks about how he used to believe that it was not possible to have fun without alcohol and shares about a website called, ?[employer].? He says that when he first learned of the site and the events that it ?turned? his ?stomach,? but he attended some events none-the-less finding out that he could have fun without alcohol. He says that a person in recovery ?can't do it alone.? He says that he continues to struggle with times in which he has resentments, or nothing seems to be going right having to do so without drinking. When he refers to himself as being a ?chronic relapser,? the therapist suggests that Vernon Fowler ?used to be? a chronic relapser suggesting that change is possible.  ? ? ?Progress Towards Goals: Vernon Fowler reports no ETOH use with AA meetings twice a day.  ? ?UDS collected: Yes  Results: No ? ?AA/NA attended?: Yes ? ?Sponsor?: Yes ? ? ?Vernon Phenix, MA, LCSW, [facility], LCAS ?12/16/2021 ?

## 2021-12-18 ENCOUNTER — Other Ambulatory Visit (HOSPITAL_COMMUNITY): Payer: Medicare Other | Admitting: Licensed Clinical Social Worker

## 2021-12-18 DIAGNOSIS — F1020 Alcohol dependence, uncomplicated: Secondary | ICD-10-CM

## 2021-12-18 NOTE — Progress Notes (Signed)
Daily Group Progress Note ? ?Program: CD IOP ? ? ?Group Time: 9 a.m. to 12 p.m.  ? ?Type of Therapy: Process and Psychoeducational  ? ?Topic: The therapist checks in with group members, assesses for SI/HI/psychosis and overall level of functioning. The therapist inquires about sobriety date and number of community support meetings attended since last session. The therapist and group members process events that went well and any challenges to recovery. The therapist reads the AA Daily Reflection asking what group members take this reflection to mean to them. The therapist facilitates group processing around how to deal with interpersonal conflict assertively and how one's not admitting that something from a relationship is bothering him can be an obstacle to practicing self-care which is a path to emotional relapse. The therapist plays a video on the stages of relapse to help group members identify the early warning signs before they are heading into physical relapse. The therapist completes Matrix handout RP 1 Alcohol asking group members about how it is for them to witness other people's substance use when sober and what things that they depended on alcohol for such as for sexual or social reasons. The therapist goes over special occasions associated with alcohol and how to celebrate them different now. He covers Matrix Module RP 2 on ?Boredom? and begins module RP 3A on ?Avoiding Relapse Drift.?  ? ? ?Summary: Vernon Fowler presents for group rating his depression as a ?2? and anxiety as a ?3 with no SI or HI. Vernon Fowler talks about a speaker meeting that he attended recently which he says was ?amazing.? 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 He recounts how his father gave him the advice to only drink ?to celebrate? things with Vernon Fowler saying that he would then ?drink to celebrate it was Monday.? When asked  about social activities, Vernon Fowler says that walking is a recreational activity and that he ?used to? play the guitar but cannot due to needing hand surgery. The therapist expresses some concern over Vernon Fowler's lack of social activities with Vernon Fowler noting that he does need to work on this as he is retired with a lot of time on his hands. When another group member asks Vernon Fowler to share about what happened during one of his past relapses as she is concerned about what to look for, Vernon Fowler talks about going to use like he was ?in a trance.? The therapist encourages Vernon Fowler to search further back into the story before he was on the way to drink to which Vernon Fowler responds that what led up to his drinking was something going on emotionally in his life. The therapist suggest that this is the reason that many sponsors have sponsees check in daily to tell them how they are feeling emotionally as some people ignore unpleasant feelings which left unattended can lead to physical relapse.  ? ?Progress Towards Goals: Vernon Fowler reports no ETOH use and is attending meetings regularly.  ? ?UDS collected: No  Results: No ? ?AA/NA attended?: Yes ? ?Sponsor?: Yes ? ? ?[name], MA, LCSW, [facility], LCAS ?12/18/2021 ?

## 2021-12-21 ENCOUNTER — Other Ambulatory Visit (HOSPITAL_COMMUNITY): Payer: Medicare Other | Admitting: Licensed Clinical Social Worker

## 2021-12-21 DIAGNOSIS — F1020 Alcohol dependence, uncomplicated: Secondary | ICD-10-CM | POA: Diagnosis not present

## 2021-12-21 NOTE — Progress Notes (Signed)
Daily Group Progress Note ? ?Program: CD IOP ? ? ?Group Time: 9 a.m. to 12 p.m.  ? ?Type of Therapy: Process and Psychoeducational  ? ?Topic: The therapist checks in with group members, assesses for SI/HI/psychosis and overall level of functioning. The therapist inquires about sobriety date and number of community support meetings attended since last session. The therapist and group members process events that went well and any challenges to recovery. The therapist facilitates a discussion today that focuses primarily on the ?insanity of the disease of addiction? pointing out how it leads to distorted thinking such that people can see a normal living in [place] housing situations or see no problem with being in what is an intolerable relationship. The therapist facilities a discussion on assertiveness and presents a module on the differences between passive, assertive, and aggressive behaviors and the things in a person's history that might have caused him or her to become passive or aggressive as opposed to assertive. The therapist suggest that being assertive is practicing self-care which helps people to remain emotionally health so as to avoid emotional relapse which in turn leads to mental relapse which in turn leads ultimately to physical relapse.  ? ? ?Summary: Vernon Fowler presents for group rating his depression as a ?2? and anxiety as a ?3? with no SI or HI. He validates that another group member is likely powerless to intervene with a family member's addiction to cigarettes discussing how when his ex tried to stop his use by not driving him somewhere how he found several ways to have alcohol delivered. In talking about passivity and co-dependency, Vernon Fowler self-discloses that he once read the book, Co-dependent No More, to address his own issues with co-dependency. He continues to attend AA meetings regularly.  ? ? ?Progress Towards Goals: Vernon Fowler reports no ETOH use with AA meetings twice a day.  ? ?UDS collected:  Yes  Results: Yes; negative for ETOH ? ?AA/NA attended?: Yes ? ?Sponsor?: Yes ? ? ?[name], MA, LCSW, [facility], LCAS ?12/21/2021 ?

## 2021-12-23 ENCOUNTER — Encounter (HOSPITAL_COMMUNITY): Payer: Self-pay | Admitting: Licensed Clinical Social Worker

## 2021-12-23 ENCOUNTER — Other Ambulatory Visit (HOSPITAL_COMMUNITY): Payer: Medicare Other | Admitting: Licensed Clinical Social Worker

## 2021-12-23 DIAGNOSIS — G894 Chronic pain syndrome: Secondary | ICD-10-CM

## 2021-12-23 DIAGNOSIS — F419 Anxiety disorder, unspecified: Secondary | ICD-10-CM

## 2021-12-23 DIAGNOSIS — E785 Hyperlipidemia, unspecified: Secondary | ICD-10-CM

## 2021-12-23 DIAGNOSIS — F1020 Alcohol dependence, uncomplicated: Secondary | ICD-10-CM

## 2021-12-23 DIAGNOSIS — I10 Essential (primary) hypertension: Secondary | ICD-10-CM

## 2021-12-23 DIAGNOSIS — E890 Postprocedural hypothyroidism: Secondary | ICD-10-CM

## 2021-12-23 DIAGNOSIS — Z6372 Alcoholism and drug addiction in family: Secondary | ICD-10-CM

## 2021-12-23 DIAGNOSIS — F341 Dysthymic disorder: Secondary | ICD-10-CM

## 2021-12-23 DIAGNOSIS — F1110 Opioid abuse, uncomplicated: Secondary | ICD-10-CM

## 2021-12-23 DIAGNOSIS — Z811 Family history of alcohol abuse and dependence: Secondary | ICD-10-CM

## 2021-12-23 NOTE — Addendum Note (Signed)
Addended by: Dara Hoyer on: 12/23/2021 03:53 PM ? ? Modules accepted: Orders, Level of Service ? ?

## 2021-12-23 NOTE — Progress Notes (Signed)
? ?[place] ?Follow-up Outpatient CDIOP ?Date: 12/23/2021 ? ?Admission Date:11/11/2021 ? ?Sobriety date:11/15/2021 ? ?Subjective: " "Am doing good" ? ?HPI :CD IOP Provider FU ?Vernon Fowler is seen today for IOP FU. Counselor notes:Vernon Fowler presents for group rating his depression as a ?2? and anxiety as a ?2? with no SI or HI ?He reports improvement in tolerating his Baclofen. He also reports his anxiety is improved. He had occasion to visit daughter for Grandchild birthday. This was the same scenario which triggered his last relapse-walking down a hallway with a table filled with alcoholic beverages and feeling ill at ease which his daughter noticed as well.  ?This time he decided to talk to his daughter about his uneasiness and his early sobriety. He did not drink. ?He continues to call his sponsor and to attend 2 AA meetings daily.  ? ?Review of Systems: ?Psychiatric: ?Agitation: No ?Hallucination: No ?Depressed Mood: PHQ 9 score 2 ?Insomnia:No  Takes Vistaril,Baclofen and Lyrica HS- ?Hypersomnia: No ?Altered Concentration: No ?Feels Worthless: Adult child of an alcoholic ?Grandiose Ideas: No ?Belief In Special Powers: No ?New/Increased Substance Abuse: No ?Compulsions: Denies cravings but admits not past triggers ? ?Neurologic: ?Headache: No ?Seizure: No ?Paresthesias: No ? ?Current Medications: ?amoxicillin-clavulanate 875-125 MG tablet ?Commonly known as: AUGMENTIN Take by mouth.  ?b complex vitamins tablet Take 1 tablet by mouth daily.  ?baclofen 10 MG tablet ?Commonly known as: LIORESAL Take 1 tablet (10 mg total) by mouth 3 (three) times daily.  ?carboxymethylcellulose 0.5 % Soln ?Commonly known as: REFRESH PLUS Place 1 drop into both eyes 2 (two) times daily as needed (dry eyes).  ?CoQ10 100 MG Caps Take 100 mg by mouth daily.  ?doxycycline 100 MG tablet ?Commonly known as: VIBRA-TABS Take by mouth.  ?Fish Oil 1000 MG Caps Take 1,000 mg by mouth daily.  ?hydrOXYzine 25 MG capsule ?Commonly known as:  VISTARIL Take by mouth.  ?irbesartan 150 MG tablet ?Commonly known as: AVAPRO Take 150 mg by mouth daily.  ?levothyroxine 175 MCG tablet ?Commonly known as: SYNTHROID Take by mouth.  ?multivitamin with minerals Tabs tablet Take 1 tablet by mouth daily. Men's 50+  ?naltrexone 50 MG tablet ?Commonly known as: DEPADE Take by mouth.  ?omeprazole 40 MG capsule ?Commonly known as: PRILOSEC TAKE ONE CAPSULE BY MOUTH TWICE A DAY  ?pregabalin 150 MG capsule ?Commonly known as: Lyrica Take 1 capsule (150 mg total) by mouth in the morning, at noon, and at bedtime.  ?propranolol 10 MG tablet ?Commonly known as: INDERAL Take 10 mg by mouth daily as needed (anxiety).  ?rosuvastatin 10 MG tablet ?Commonly known as: CRESTOR Take 10 mg by mouth at bedtime.  ?thiamine 100 MG tablet ?Commonly known as: Vitamin B-1 Take 100 mg by mouth daily.  ?traZODone 100 MG tablet ?Commonly known as: DESYREL Take 100 mg by mouth at bedtime.  ? ?Mental Status Examination  ?Appearance: Neat casual ?Alert: Yes ?Attention: good  ?Cooperative: Yes ?Eye Contact: Good ?Speech: Clear and coherent ?Psychomotor Activity: Normal ?Memory/Concentration: Normal/intact ?Oriented: person, place, time/date and situation ?Mood: Euthymic ?Affect: Appropriate and Congruent ?Thought Processes and Associations: Coherent and Intact ?Fund of Knowledge: Good ?Thought Content: WDL ?Insight: Good ?Judgement: Good ? ?UDS:RX MEDS AND NICOTENE ? ?PDMP:PREGABILIN AS PRESCRIBED ? ?Diagnosis:  ?Alcohol use disorder, severe, dependence ([place]) ?Opioid abuse, episodic ([place]) ?Chronic pain syndrome ?Alcoholism in family member ?Dysthymic disorder ?Chronic anxiety ?Dysfunctional family due to alcoholism ?H/O total thyroidectomy ?Essential hypertension ?Hyperlipidemia, unspecified hyperlipidemia type ? ?Assessment: Continues to abstain in early recovery. Reports meds  efficacious for cravings and dysthymia (anxiety) ? ?Treatment Plan:Per admission ? ?Darlyne Russian, PA-C Patient ID: Vernon Fowler, male   DOB: 10/28/1955, 66 y.o.   MRN: [id number] ? ?
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
## 2021-12-25 ENCOUNTER — Other Ambulatory Visit (HOSPITAL_COMMUNITY): Payer: Medicare Other | Admitting: Licensed Clinical Social Worker

## 2021-12-25 DIAGNOSIS — F1020 Alcohol dependence, uncomplicated: Secondary | ICD-10-CM | POA: Diagnosis not present

## 2021-12-25 NOTE — Progress Notes (Signed)
Daily Group Progress Note ? ?Program: CD IOP ? ? ?Group Time: 9 a.m. to 12 p.m.  ? ?Type of Therapy: Process and Psychoeducational  ? ?Topic: The therapist checks in with group members, assesses for SI/HI/psychosis and overall level of functioning. The therapist inquires about sobriety date and number of community support meetings attended since last session. The therapist and group members process events that went well and any challenges to recovery. The therapist covers Matrix handout RP 6 Staying Busy with the major focus of the group being on the importance of self-care and the importance of not having idle time and staying busy while being sure to include fun activities into the schedule. The therapist suggests that a person who has nothing to do and nowhere to go can be at risk to using due to boredom; however, some people find being alone a trigger due to being left alone with their own negative, critical self-talk. The therapist introduces a new member to group today with all group members giving brief accounts of how they ended up in [place] IOP.  ? ?Summary: Vernon Fowler presents for group rating his depression as a ?1? and anxiety as a ?4? with no SI or HI. Vernon Fowler says that he continues to attend two AA meetings per day. He says that on the days that he does not attend SA IOP that he stays in bed and sleeps up to 11 hours per day. As the new group member is also retired like [employer], there is a discussion concerning the challenge that many retired people face in finding a purpose for their lives now that they are not working. The therapist connects the need for a purpose to the topic of ?Staying Busy.? The therapist notes that it is good that Vernon Fowler is staying focused on his recovery; however, he encourages Vernon Fowler to consider what other things he might want to do to find meaning in his life now that he is retired. 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 He says that one component that is perhaps lacking in his recovery is a ?service component? with another member reminding Vernon Fowler that he can do some sort of service work at [employer] once he has 60 days sober.  ? ?Progress Towards Goals: Vernon Fowler reports no ETOH use with AA meetings twice a day. Vernon Fowler  ? ?UDS collected: No  Results: No ? ?AA/NA attended?: Yes ? ?Sponsor?: Yes ? ? ?[name], MA, LCSW, [facility], LCAS ?12/25/2021 ?

## 2021-12-27 ENCOUNTER — Other Ambulatory Visit: Payer: Self-pay | Admitting: Gastroenterology

## 2021-12-28 ENCOUNTER — Encounter (HOSPITAL_COMMUNITY): Payer: Medicare Other

## 2021-12-28 ENCOUNTER — Telehealth (HOSPITAL_COMMUNITY): Payer: Self-pay | Admitting: Licensed Clinical Social Worker

## 2021-12-28 NOTE — Telephone Encounter (Signed)
The therapist receives a voicemail from [place] today, 12/28/21, at 12:35 p.m. apologizing for missing group. ? ?He says that he had an appointment this morning at [facility]; however, as there office flooded, he was there for 1 hour and 45 minutes and that the time he was done that SA IOP was likely on the latter half of group. ? ?He says that he plans on attending on Wednesday, 12/30/21. ? ? ?[name], MA, LCSW, [facility], LCAS ?12/28/2021 ? ? ?

## 2021-12-30 ENCOUNTER — Other Ambulatory Visit (HOSPITAL_COMMUNITY): Payer: Medicare Other | Attending: Psychiatry | Admitting: Licensed Clinical Social Worker

## 2021-12-30 DIAGNOSIS — F1020 Alcohol dependence, uncomplicated: Secondary | ICD-10-CM | POA: Insufficient documentation

## 2021-12-30 NOTE — Progress Notes (Signed)
Daily Group Progress Note ? ?Program: CD IOP ? ? ?Group Time: 9 a.m. to 12 p.m.  ? ?Type of Therapy: Process and Psychoeducational  ? ?Topic: The therapist checks in with group members, assesses for SI/HI/psychosis and overall level of functioning. The therapist inquires about sobriety date and number of community support meetings attended since last session. The therapist focuses primarily on the issue of addiction as being a chronic condition that is progressive if it is not in remission. The therapist answers questions regarding different types of medication-assisted treatment and talks about the reason that Twelve Step Programs emphasize avoiding people, places, and things associated with drug or alcohol use. The therapist talks about how individuals typically having the most difficulty with avoiding people noting that people in recovery may have to make the hard decision to end relationships with friends or family who remain in active addiction. The therapist advises group members that relationships will change as a result of recovery typically for the better over the long run. The therapist provides handouts of the [place] discussing with group members where they were on the curve when they entered treatment and where they are now. The therapist notes that younger people in recovery may enter treatment due to having reached the Crucial Phase, whereas many people entering treatment around middle-age have hit the Chronic Phase.  ? ? ?Summary: Vernon Fowler presents for group rating his depression as a ?1? and anxiety as a ?2? with no SI or HI. He says that he is now focusing more on Step Meetings, literature study, and Big Book study meetings saying that he especially likes the [employer] meetings. He says that when he was in treatment that most of the meetings were speaker meetings. When another group member questions what to do regarding attending birthday celebrations with friends who celebrate with  drinking, Vernon Fowler shares about how he faced a similar situation with family events and how he handled it. When another group member who is new to treatment shares her perception about feeling like she is being pressured to attend meetings every day and do service work, Vernon Fowler shares a passage from the [employer] suggesting that how she is viewing the program is not consistent with the main principles of the program. Vernon Fowler notes that he went to the bottom of the [employer] but is stuck at a point where he has not yet developed lots of friendships and hobbies outside of treatment saying that this is something that he needs to address. The therapist encourages Vernon Fowler to return to group with specific steps regarding when and how he will address this. Today, Vernon Fowler's aftercare is discussed with Vernon Fowler noting that he would like to know if he can continue to attend SA IOP beyond 24 visits. He says that he would like to see this therapist for his aftercare; however, the therapist informs him that if his current therapist is treating him for both a mood disorder and his substance use that this therapist could not see him as this would constitute an ethics violation. Vernon Fowler is going to talk with his therapist to see if she is primarily focusing on issues with his mood and what her thoughts would be about his seeing a different therapist for substance-specific treatment. He says that she is working with him on ?DBT.?  ? ?Progress Towards Goals: Vernon Fowler reports no ETOH use with AA meetings twice a day.  ? ?UDS collected: Yes  Results: No ? ?AA/NA attended?: Yes ? ?Sponsor?: Yes ? ? ?  [name], MA, LCSW, [facility], LCAS ?12/30/2021 ?

## 2022-01-01 ENCOUNTER — Other Ambulatory Visit (HOSPITAL_COMMUNITY): Payer: Medicare Other | Admitting: Licensed Clinical Social Worker

## 2022-01-01 DIAGNOSIS — F1020 Alcohol dependence, uncomplicated: Secondary | ICD-10-CM

## 2022-01-01 NOTE — Progress Notes (Signed)
Daily Group Progress Note ? ?Program: CD IOP ? ? ?Group Time: 9 a.m. to 12 p.m.  ? ?Type of Therapy: Process and Psychoeducational  ? ?Topic: The therapist checks in with group members, assesses for SI/HI/psychosis and overall level of functioning. The therapist inquires about sobriety date and number of community support meetings attended since last session. The therapist facilitates a group discussion regarding the dangers of benzodiazepines. The therapist makes group members aware of the fact that unlike opioids and marijuana that people develop tolerance to alcohol for live. The therapist reviews the NA Just for Today reading regarding the topic of ?Any lengths? while contrasting the message of this reading with the AA and NA sayings of ?take what you need and leave the rest? and ?principles before personalities.? The therapist normalizes interpersonal conflicts and disagreements arise in Twelve Step groups as well as any groups to which humans belong. The therapist discusses how all or nothing thinking in response to interpersonal challenges can lead a person out of recovery and back to using. The therapist highlights the need for learning interpersonal effectiveness skills if one wants to maintain long-term sobriety. The therapist has group members complete the Matrix Recovery Checklist and complete IC 3A Treatment Evaluation discussing this in relation to what research says are key ?Recovery Indicators.? The therapist suggests that SA IOP is not solely meant to be a haven of support for not drinking but a place where group members are working on things like finding a purpose for their lives, building a non-using support system, etc. The therapist informs group members that they are to be working on these things while in group as opposed to starting these things after they discharge from group.  ? ?Summary: Vernon Fowler presents for group rating his depression as a ?3? and anxiety as a ?3? with no SI or HI. The  therapist explains that Vernon Fowler could potentially stay in [facility] IOP beyond 8 weeks; however, that this would be based on whether doing so is medically necessary. The therapist questions what Vernon Fowler is putting in place this time that he has not put in place before such that he no longer remains a ?chronic replaser.? The therapist suggests that the things that he has not accomplished towards the top of the [place] can serve as a guide regarding areas on which he needs to focus. On his worksheets, Vernon Fowler indicates that eh needs to put his most effort into ?Exercise;? however, the therapist points out that Vernon Fowler has never seemed to put in place friends and hobbies and recreational activities in addition to a finding a meaning and purpose for his life post-retirement. 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  ? ?Progress Towards Goals: Vernon Fowler reports no ETOH use with AA meetings twice a day.  ? ?UDS collected: No  Results: No ? ?AA/NA attended?: Yes ? ?Sponsor?: Yes ? ? ?[name], MA, LCSW, [facility], LCAS ?01/01/2022 ? ?

## 2022-01-04 ENCOUNTER — Telehealth (HOSPITAL_COMMUNITY): Payer: Self-pay | Admitting: Licensed Clinical Social Worker

## 2022-01-04 ENCOUNTER — Encounter (HOSPITAL_COMMUNITY): Payer: Medicare Other

## 2022-01-04 NOTE — Telephone Encounter (Signed)
Vernon Fowler leaves a voicemail for this therapist on 01/03/22 informing the therapist that he will not be in group today due to a previously scheduled doctor's appointment. ? ?[name], MA, LCSW, [facility], LCAS ?01/04/2022 ?

## 2022-01-06 ENCOUNTER — Encounter (HOSPITAL_COMMUNITY): Payer: Self-pay | Admitting: Licensed Clinical Social Worker

## 2022-01-06 ENCOUNTER — Other Ambulatory Visit (HOSPITAL_COMMUNITY): Payer: Medicare Other | Admitting: Licensed Clinical Social Worker

## 2022-01-06 DIAGNOSIS — E785 Hyperlipidemia, unspecified: Secondary | ICD-10-CM

## 2022-01-06 DIAGNOSIS — E890 Postprocedural hypothyroidism: Secondary | ICD-10-CM

## 2022-01-06 DIAGNOSIS — G894 Chronic pain syndrome: Secondary | ICD-10-CM

## 2022-01-06 DIAGNOSIS — F341 Dysthymic disorder: Secondary | ICD-10-CM

## 2022-01-06 DIAGNOSIS — Z6372 Alcoholism and drug addiction in family: Secondary | ICD-10-CM

## 2022-01-06 DIAGNOSIS — I10 Essential (primary) hypertension: Secondary | ICD-10-CM

## 2022-01-06 DIAGNOSIS — Z811 Family history of alcohol abuse and dependence: Secondary | ICD-10-CM

## 2022-01-06 DIAGNOSIS — F419 Anxiety disorder, unspecified: Secondary | ICD-10-CM

## 2022-01-06 DIAGNOSIS — F1110 Opioid abuse, uncomplicated: Secondary | ICD-10-CM

## 2022-01-06 DIAGNOSIS — F1020 Alcohol dependence, uncomplicated: Secondary | ICD-10-CM

## 2022-01-06 NOTE — Progress Notes (Signed)
Daily Group Progress Note ? ?Program: CD IOP ? ? ?Group Time: 9 a.m. to 10:35 a.m.  ? ?Type of Therapy: Process and Psychoeducational  ? ?Topic: The therapist checks in with group members, assesses for SI/HI/psychosis and overall level of functioning. The therapist inquires about sobriety date and number of community support meetings attended since last session. The therapist and group members process events that went well and any challenges to recovery. The therapist facilitates a discussion about honesty in recovery picking up from the theme of last group's topic. The therapist shows a short video on how addiction requires dishonest. The therapist covers Matrix Module RP 9 ?Total Abstinence? and RP 10 ?Sex and Recovery? with the group.  ? ?Summary: Vernon Fowler presents for group rating his depression as a ?2? and anxiety as a ?3? with no SI or HI. He says that he has continued to attend two meetings per day. He is the only group member who is familiar with what is meant by a ?using dream? and the only one to have had one thus far. He says that he went to his annual physical and that his liver enzymes were ?perfect;? however, he needs to lose some weight due to craving sweets. After talking to his doctor, Vernon Fowler says that he worked out on his elliptical yesterday with the goal of getting back into running and running in a 10K. He says that he also is going to get surgery on his hand so he can play the guitar. 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 The therapist notes that Vernon Fowler has accomplished a lot in regards to working on his goal of finding meaning in his life and more recreational activities. He ends up getting a phone call from his daughter and has to leave group early as she is having problem closing on a house due to problems getting [name]'s financial information as he is a collateral on the loan.   ? ?Progress Towards Goals: [name] reports no ETOH use with AA meetings twice a day.  ? ?UDS collected: No  Results: No ? ?AA/NA attended?: Yes ? ?Sponsor?: Yes ? ? ?[name], MA, LCSW, [facility], LCAS ?01/06/2022 ?

## 2022-01-06 NOTE — Progress Notes (Addendum)
   [facility] Follow-up Outpatient CDIOP Date: 01/06/2022  Admission Date:11/11/2021  Sobriety date:11/15/2021  Subjective: " I'm good. How are you?"  HPI : CD IOP Provider FU Pt is now 51 days clean and sober and tolerating medications well. He continues to work with his sponsor in [place]. Voices no problems or concerns today.Saw his MD and LFTs are normal but wgt is Steele Sizer is craving sweets.  Review of Systems: Psychiatric: Agitation: Chronic anxiety Hallucination: No Depressed Mood: No Insomnia: Rx Trazodone Hypersomnia: No Altered Concentration: No Feels Worthless: Chronic insecurities from growing up in alcholic family (subconscious) Grandiose Ideas: No Belief In Special Powers: No New/Increased Substance Abuse: No Compulsions: Early recovery triggers/Sugar  Neurologic: Headache: No Seizure: No Paresthesias: No  Current Medications: b complex vitamins tablet Take 1 tablet by mouth daily.  baclofen 10 MG tablet Commonly known as: LIORESAL Take 1 tablet (10 mg total) by mouth 3 (three) times daily.  buPROPion 150 MG 24 hr tablet Commonly known as: WELLBUTRIN XL Take by mouth.  carboxymethylcellulose 0.5 % Soln Commonly known as: REFRESH PLUS Place 1 drop into both eyes 2 (two) times daily as needed (dry eyes).  CoQ10 100 MG Caps Take 100 mg by mouth daily.  cyclobenzaprine 10 MG tablet Commonly known as: FLEXERIL Take by mouth.  diphenhydrAMINE 12.5 MG/5ML elixir Commonly known as: BENADRYL Take by mouth daily as needed.  doxycycline 100 MG tablet Commonly known as: VIBRA-TABS Take by mouth.  Fish Oil 1000 MG Caps Take 1,000 mg by mouth daily.  hydrOXYzine 25 MG capsule Commonly known as: VISTARIL Take by mouth.  irbesartan 150 MG tablet Commonly known as: AVAPRO Take 150 mg by mouth daily.  levothyroxine 175 MCG tablet Commonly known as: SYNTHROID Take by mouth.  multivitamin with minerals Tabs tablet Take 1 tablet by mouth daily. Men's 50+  naltrexone  50 MG tablet Commonly known as: DEPADE Take by mouth.  omeprazole 40 MG capsule Commonly known as: PRILOSEC TAKE ONE CAPSULE BY MOUTH TWICE A DAY  predniSONE 10 MG tablet Commonly known as: DELTASONE Take by mouth.  pregabalin 150 MG capsule Commonly known as: Lyrica Take 1 capsule (150 mg total) by mouth in the morning, at noon, and at bedtime.  propranolol 10 MG tablet Commonly known as: INDERAL Take 10 mg by mouth daily as needed (anxiety).  rosuvastatin 10 MG tablet Commonly known as: CRESTOR Take 10 mg by mouth at bedtime.  sertraline 100 MG tablet Commonly known as: ZOLOFT Take by mouth.  thiamine 100 MG tablet Commonly known as: Vitamin B-1 Take 100 mg by mouth daily.  traZODone 50 MG tablet Commonly known as: DESYREL Take by mouth    Mental Status Examination  Appearance:Neat casual Alert: Yes Attention: good  Cooperative: Yes Eye Contact: Good Speech: Clear and coherent Psychomotor Activity: Normal Memory/Concentration: Normal/intact Oriented: person, place, time/date and situation Mood: Some anxiety Affect: Appropriate and Congruent Thought Processes and Associations: Coherent and Intact/No SI-HI Fund of Knowledge: WDL Thought Content: WDL Insight: Lacking in area of his childhood development (Lack of) due to alcoholism in family Judgement: Impaired but improving  UDS:No illicits  PDMP:Pregabilin Rx  Diagnosis:  Alcohol use disorder, severe, dependence (HCC) Opioid abuse, episodic (HCC) Chronic pain syndrome Alcoholism in family member Dysthymic disorder Chronic anxiety Dysfunctional family due to alcoholism H/O total thyroidectomy Essential hypertension Hyperlipidemia, unspecified hyperlipidemia   Assessment: Early recovery-managing so far with help of IOP and AA  Treatment Plan:Continue per admission Darlyne Russian, PA-C

## 2022-01-08 ENCOUNTER — Other Ambulatory Visit (HOSPITAL_COMMUNITY): Payer: Medicare Other | Admitting: Licensed Clinical Social Worker

## 2022-01-08 DIAGNOSIS — F1020 Alcohol dependence, uncomplicated: Secondary | ICD-10-CM

## 2022-01-08 NOTE — Progress Notes (Signed)
Group Time: 9 a.m. to 12 p.m.  ? ?Type of Therapy: Process and Psychoeducational  ? ?Topic: The therapist checks in with group members, assesses for SI/HI/psychosis and overall level of functioning. The therapist inquires about sobriety date and number of community support meetings attended since last session. The therapist and group members process events that went well and any challenges to recovery. The therapist facilitates a discussion on the different type of AA meetings and the differences between [place] meetings and NA meetings. The therapist clarifies what is meant by avoiding mind-altering drugs emphasizing that this does not mean avoiding one's non-addictive, medication medications. The therapist illustrates how to make a differentiation between addictive and non-addictive medications noting that taking too much of a non-addictive medication does not lead to a high or intoxication and that non-addictive medications do not cause physiological dependence. Additionally, people do not crave non-addictive medications and non-addictive medications have zero street value. The therapist provides information on how grief relates to recovery from addiction in that people in recovery must grieve things that they have lost and even grieve the loss of their old identity. Additionally, substance use can be used as a means of numbing and avoiding the pain of the grief of losing loved ones. The therapist talks about healthy ways to cope with grief such as attending a bereavement support group. The therapist educates group members on how many people in recovery experienced early childhood trauma talking to them about the ACES study and its implications.  ? ?Summary: Vernon Fowler presents for group rating his depression as a ?2? and anxiety as a ?3? with no SI or HI. [name] encourages new members to try attending different types of AA meetings as opposed to just attending only speaker meetings or only open discussion meetings. In  talking about how grief can impact addiction, Vernon Fowler notes that he was actively drinking when he lost both his mother and his father and that their deaths lead to an increase in his alcohol consumption. He discusses how anxiety has always been a big trigger for him to drink. He talks about how his daughter's having contacted him in need of information ?right now? so as to close on her house is something that in the past would have elevated his anxiety such that he would have drunk. He says that he frequently has thoughts of drinking; however, he is not having cravings.  ? ?Progress Towards Goals: [name] reports no ETOH use with AA meetings twice a day.  ? ?UDS collected: Yes  Results: No ? ?AA/NA attended?: Yes ? ?Sponsor?: Yes ? ? ?[name], MA, LCSW, [facility], LCAS ?01/08/2022 ?

## 2022-01-11 ENCOUNTER — Telehealth (HOSPITAL_COMMUNITY): Payer: Self-pay | Admitting: Licensed Clinical Social Worker

## 2022-01-11 NOTE — Telephone Encounter (Signed)
Vernon Fowler leaves a voicemail on 01/10/22 indicating that he will not be in group on 01/11/22 as he has to be in [place] for his daughter's closing on her house. He says that he will be in group on 01/13/22. ? ?[name], MA, LCSW, [facility], LCAS ?01/11/2022 ?

## 2022-01-12 ENCOUNTER — Telehealth (HOSPITAL_COMMUNITY): Payer: Self-pay | Admitting: Licensed Clinical Social Worker

## 2022-01-12 NOTE — Telephone Encounter (Signed)
[name], MA, LCSW, [facility], LCAS ?01/12/2022 ?

## 2022-01-12 NOTE — Telephone Encounter (Signed)
Vernon Fowler leaves a voicemail saying that he has to stay in [place] with his daughter longer so will miss group again on 01/13/22; however, he plans on attending on 01/15/22. ? ?[name], MA, LCSW, [facility], LCAS ?01/12/2022 ?

## 2022-01-14 ENCOUNTER — Telehealth (HOSPITAL_COMMUNITY): Payer: Self-pay | Admitting: Licensed Clinical Social Worker

## 2022-01-14 NOTE — Telephone Encounter (Signed)
The therapist receives a voicemail from [place] saying that he likely will not be in group tomorrow as he is apparently in the process of helping his daughter move.  [name], [place], LCSW, [facility], [place] 01/14/2022

## 2022-01-15 ENCOUNTER — Encounter (HOSPITAL_COMMUNITY): Payer: BLUE CROSS/BLUE SHIELD

## 2022-01-16 ENCOUNTER — Other Ambulatory Visit (HOSPITAL_COMMUNITY): Payer: Self-pay | Admitting: Medical

## 2022-01-18 ENCOUNTER — Other Ambulatory Visit (HOSPITAL_COMMUNITY): Payer: Medicare Other | Admitting: Licensed Clinical Social Worker

## 2022-01-18 DIAGNOSIS — F1020 Alcohol dependence, uncomplicated: Secondary | ICD-10-CM | POA: Diagnosis not present

## 2022-01-18 NOTE — Progress Notes (Signed)
Daily Group Progress Note  Program: CD IOP   Group Time: 9 a.m. to 12 p.m.   Type of Therapy: Process and Psychoeducational   Topic: The therapist checks in with group members, assesses for SI/HI/psychosis and overall level of functioning. The therapist inquires about sobriety date and number of community support meetings attended since last session. The therapist and group members process events that went well and any challenges to recovery. The therapist introduces two members who were not in group last Friday to the group's newest member. The therapist has group members each describe a typical day and share thoughts and observations about their typical day after hearing what others do. The therapist covers RP 14 Defining Spirituality from the Matrix model. The therapist observes that group members define success as being healthy, having family relationships, and financial security while making no mention of spirituality. The therapist notes that problems will arise in these areas of peoples' lives which often triggers relapses. The therapist talks about the concept of living "life on life's terms" and the importance of spirituality in being able to get through things like a family illness or death without drinking.   Summary: Vernon Fowler presents for group rating his depression as a "1" and anxiety as a "3" with no SI or HI. He was not in group at all last week due to helping his daughter with her closing and move to her new house. He says that he had to deal with triggers when moving his daughter as her husband "loves his beer." Vernon Fowler is proud that he did not drink saying that he used what worked with another member in group as he kept himself busy cleaning their kitchen and holding his grandchild a lot. He says that having a drink is in the back of his mind daily; however, he has not had cravings which he attributes to the Baclofen working. He says that he follows his Sponsor's advice in saying to himself  every more that he is an alcoholic. In going over his daily routine, it is apparent that in between his two meeting a day that Vernon Fowler spends a lot of time watching television. He walks his dogs about three miles per day every afternoon around 4 p.m. Vernon Fowler concludes that he should spend more time reading. Vernon Fowler is the only group member who identifies spirituality as being a person's relationship with God. He says that he will pray and meditate daily and tries to "let go" and realize that he is not the one "running the show." Vernon Fowler says that what he wants out of life are family connections saying that this is improve but he is "not there yet." He says that his oldest daughter told him to not call her until he has a year of sobriety which is hard on him. He is happy that his youngest daughter is now calling him for advice. He says that his drinking caused to cancel a lot as he would claim to not be feeling well. 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 At the conclusion of group, Vernon Fowler says that he cannot put into words what he got out of group other than saying that he believes that this is one of the best groups that he has attended.   Progress Towards Goals: Vernon Fowler reports no ETOH use with AA meetings twice a day.  UDS collected: Yes  Results: No  AA/NA attended?: Yes  Sponsor?: Yes   Vernon Fowler, [place], [place], [facility], [place] 01/18/2022

## 2022-01-20 ENCOUNTER — Other Ambulatory Visit (HOSPITAL_COMMUNITY): Payer: Medicare Other | Admitting: Licensed Clinical Social Worker

## 2022-01-20 ENCOUNTER — Encounter (HOSPITAL_COMMUNITY): Payer: Self-pay | Admitting: Licensed Clinical Social Worker

## 2022-01-20 DIAGNOSIS — Z811 Family history of alcohol abuse and dependence: Secondary | ICD-10-CM

## 2022-01-20 DIAGNOSIS — I10 Essential (primary) hypertension: Secondary | ICD-10-CM

## 2022-01-20 DIAGNOSIS — Z6372 Alcoholism and drug addiction in family: Secondary | ICD-10-CM

## 2022-01-20 DIAGNOSIS — F341 Dysthymic disorder: Secondary | ICD-10-CM

## 2022-01-20 DIAGNOSIS — E890 Postprocedural hypothyroidism: Secondary | ICD-10-CM

## 2022-01-20 DIAGNOSIS — F1021 Alcohol dependence, in remission: Secondary | ICD-10-CM

## 2022-01-20 DIAGNOSIS — F419 Anxiety disorder, unspecified: Secondary | ICD-10-CM

## 2022-01-20 DIAGNOSIS — G894 Chronic pain syndrome: Secondary | ICD-10-CM

## 2022-01-20 DIAGNOSIS — F1110 Opioid abuse, uncomplicated: Secondary | ICD-10-CM

## 2022-01-20 DIAGNOSIS — E785 Hyperlipidemia, unspecified: Secondary | ICD-10-CM

## 2022-01-20 DIAGNOSIS — F1020 Alcohol dependence, uncomplicated: Secondary | ICD-10-CM | POA: Diagnosis not present

## 2022-01-20 MED ORDER — BACLOFEN 10 MG PO TABS: 10.0000 mg | ORAL_TABLET | Freq: Three times a day (TID) | ORAL | 1 refills | Status: DC

## 2022-01-20 NOTE — Progress Notes (Addendum)
[facility] Follow-up Outpatient CDIOP Date: 01/20/2022  Admission Date: 11/11/2021  Sobriety date:11/15/2021  Subjective: "I'm doing well"  HPI : CD IOP Provider FU Patient reports he continues to abstain with help of Baclofen Counselor notes:Vernon Fowler presents for group late due to having to wait on a home repair person. He rates his depression as a "2" and anxiety as a "3" with no SI or HI. He says that he would like to talk with the P.A. today about "one little thing." He says that he has been stuttering a little or searching for words at times which he believes is likely either his Zoloft, his Trazodone, or his Pregabalin. Patient confirms question about medications but on further discussing allows it might well be his anxiety. He will continue to monitor problem and let us know if it persists.Also he was advised that Baqclofen is not considered a lifetime chronic medication for cravings ansd he will know when he no longer requires it. He continues to active in [place] with his sponsor. He is trying to help fellow group member get to [place] and possibly meet sponsor.  Review of Systems: Psychiatric: Agitation: Chronic anxiety rx Pregabilin/Vistaril Hallucination: No Depressed Mood: No Insomnia: Rx Trazodone Hypersomnia: No Altered Concentration: No Feels Worthless: Childhood insecurities persist below the level of Consciousness Grandiose Ideas: No Belief In Special Powers: No New/Increased Substance Abuse: No Compulsions: Triggers of early sobriety/Cravings managed with Baclofen so far  Neurologic: Headache: No Seizure: No Paresthesias: No  Current Medications: Commonly known as: WELLBUTRIN XL Take by mouth.  carboxymethylcellulose 0.5 % Soln Commonly known as: REFRESH PLUS Place 1 drop into both eyes 2 (two) times daily as needed (dry eyes).  CoQ10 100 MG Caps Take 100 mg by mouth daily.  diphenhydrAMINE 12.5 MG/5ML elixir Commonly known as: BENADRYL Take by mouth daily  as needed.  doxycycline 100 MG tablet Commonly known as: VIBRA-TABS Take by mouth.  Fish Oil 1000 MG Caps Take 1,000 mg by mouth daily.  folic acid 1 MG tablet Commonly known as: FOLVITE folic acid 1 mg tablet  TAKE ONE TABLET BY MOUTH EVERY DAY  gabapentin 300 MG capsule Commonly known as: NEURONTIN 3 (three) times daily as needed.  hydrOXYzine 50 MG capsule Commonly known as: VISTARIL hydroxyzine pamoate 50 mg capsule  TAKE ONE CAPSULE BY MOUTH EVERY 6 HOURS AS NEEDED  irbesartan 150 MG tablet Commonly known as: AVAPRO Take 150 mg by mouth daily.  levothyroxine 175 MCG tablet Commonly known as: SYNTHROID Take by mouth.  multivitamin with minerals Tabs tablet Take 1 tablet by mouth daily. Men's 50+  naltrexone 50 MG tablet Commonly known as: DEPADE Take by mouth.  omeprazole 40 MG capsule Commonly known as: PRILOSEC TAKE ONE CAPSULE BY MOUTH TWICE A DAY  predniSONE 10 MG tablet Commonly known as: DELTASONE Take by mouth.  pregabalin 150 MG capsule Commonly known as: Lyrica Take 1 capsule (150 mg total) by mouth in the morning, at noon, and at bedtime.  propranolol 10 MG tablet Commonly known as: INDERAL Take 10 mg by mouth daily as needed (anxiety).  rosuvastatin 10 MG tablet Commonly known as: CRESTOR Take 10 mg by mouth at bedtime.  sertraline 100 MG tablet Commonly known as: ZOLOFT Take by mouth.  thiamine 100 MG tablet Commonly known as: Vitamin B-1 Take 100 mg by mouth daily.  traZODone 50 MG tablet Commonly known as: DESYREL Take by mouth.    Mental Status Examination  Appearance:Neat Casual Alert: Yes Attention: good  Cooperative:  Yes Eye Contact: Good Speech: Clear and coherent Psychomotor Activity: Normal Memory/Concentration: Normal/intact Oriented: person, place, time/date and situation Mood: Variable-Euthymic to slighty anxious at times Affect: Full range Thought Processes and Associations: Coherent and Intact Fund of Knowledge: Good Thought Content:  WDL Insight: Lacking Judgement:Improving  UDS:As expected No illicits  PDMP:Pregabilin RX  Diagnosis:  Alcohol use disorder, severe, in early remission, dependence ([place]) Opioid abuse, episodic (HCC) Chronic pain syndrome Alcoholism in family member Dysthymic disorder Chronic anxiety Dysfunctional family due to alcoholism H/O total thyroidectomy Essential hypertension Hyperlipidemia, unspecified hyperlipidemia type  Assessment: In early recovery and managing so far to remain abstinent with help of MAT/Groups (IOP/AA) Sponsorship  Treatment Plan:Continue per admission.Continue to monitor for side effects of meds Darlyne Russian, PA-C
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
## 2022-01-21 NOTE — Telephone Encounter (Signed)
Reordered at [facility] 5/24

## 2022-01-22 ENCOUNTER — Encounter (HOSPITAL_COMMUNITY): Payer: BLUE CROSS/BLUE SHIELD

## 2022-01-22 ENCOUNTER — Telehealth (HOSPITAL_COMMUNITY): Payer: Self-pay | Admitting: Licensed Clinical Social Worker

## 2022-01-22 NOTE — Telephone Encounter (Signed)
Vernon Fowler calls saying that he will not be in group today due to illness.  [name], [place], [place], [facility], [place] 01/22/2022

## 2022-01-27 ENCOUNTER — Other Ambulatory Visit (HOSPITAL_COMMUNITY): Payer: Medicare Other | Admitting: Licensed Clinical Social Worker

## 2022-01-27 DIAGNOSIS — F1020 Alcohol dependence, uncomplicated: Secondary | ICD-10-CM | POA: Diagnosis not present

## 2022-01-27 DIAGNOSIS — F1021 Alcohol dependence, in remission: Secondary | ICD-10-CM

## 2022-01-27 NOTE — Progress Notes (Signed)
Daily Group Progress Note  Program: CD IOP   Group Time: 9 a.m. to 12 p.m.   Type of Therapy: Process and Psychoeducational   Topic: The therapist checks in with group members, assesses for SI/HI/psychosis and overall level of functioning. The therapist inquires about sobriety date and number of community support meetings attended since last session. The therapist provides group member handouts on cannabis use with data showing how cannabis use appears to be related to people later developing problems with alcohol or related to exacerbating drinking problems in people who drink and smoke marijuana. The therapist facilitates a discussion on using dreams suggesting that rather than be a trigger for relapse that they can be viewed as a positive in helping people to remember how bad things were when they were using and to be grateful for sobriety. The therapist introduces one of the group members who was absent last week to the new group member with both sharing their stories of how they came to be in [facility]. The therapist educates group members on the effects of drugs and alcohol on the brain while discussing how the brain can recover over times. The therapist facilitates a group discussion on coping with grief in relation to addiction and presents RP 16 Relapse Justification from the Matrix handbook giving group members the homework assignment of identifying what things their disease told them to get them to overlook obvious signs that their lives were becoming unmanageable and, in many cases, to continue to relapse regardless of this.   Summary: Vernon Fowler presents for group rating his depression as a "1" and anxiety as a "2" with no SI or HI. He encourages another group member who has not been attend AA to attend a "good meeting" that he attends on Mondays. Vernon Fowler recounts his story noting that he drank his first beer at age 44. He says that his drinking progressed a lot during his tumultuous 2nd marriage and  got worse when he divorced and retired at the same time about 5 years ago. He says that his first effort at getting sober was at a treatment facility in [place]. He relapsed on the plane on the way home and it was after this that he got arrested for a DWI. He says that he was in treatment in [place] twice, once in [place], and twice at [facility]. He says that he attended a SA IOP at this office briefly when "Lelon Frohlich" was running it. He says that he has done relapse autopsies before saying that he once relapsed thinking that he needed a few drinks to be able to "relax and decompress" due to problems between his 2nd wife and his daughters. He also is able to identify with his disease telling him that he needed to drink to be able to be "fun dad." Vernon Fowler recognizes that he is likely not as aware of all the things that his disease convinced him of that caused him to relapse but will work on this between now and next group. In response to another group member talking about her family having to put down their 89 year old dog, Vernon Fowler shares that his oldest rescue dog is at the [employer] currently having many of this issues that led to the other group member's family having to make this decision.    Progress Towards Goals: [name] reports no ETOH use with AA meetings twice a day.   UDS collected: Yes  Results: Yes, negative for ETOH  AA/NA attended?: Yes  Sponsor?: Yes  [name], [place], LCSW, [facility], [place] 01/27/2022

## 2022-01-29 ENCOUNTER — Telehealth (HOSPITAL_COMMUNITY): Payer: Self-pay | Admitting: Licensed Clinical Social Worker

## 2022-01-29 ENCOUNTER — Encounter (HOSPITAL_COMMUNITY): Payer: BLUE CROSS/BLUE SHIELD

## 2022-01-29 NOTE — Telephone Encounter (Signed)
[name] leaves a voicemail this morning saying that he is in [place] so needs to cancel group for today.  [name], [place], LCSW, [facility], [place] 01/29/2022

## 2022-02-01 ENCOUNTER — Telehealth (HOSPITAL_COMMUNITY): Payer: Self-pay | Admitting: Licensed Clinical Social Worker

## 2022-02-01 ENCOUNTER — Encounter (HOSPITAL_COMMUNITY): Payer: BLUE CROSS/BLUE SHIELD

## 2022-02-01 NOTE — Telephone Encounter (Signed)
Vernon Fowler leaves a voicemail yesterday saying that he will not be in group today as he is still in [place] dealing with "drama" involving his daughter, her child, and his husband.    [name], [place], LCSW, [facility], [place] 02/01/2022

## 2022-02-02 ENCOUNTER — Ambulatory Visit (AMBULATORY_SURGERY_CENTER): Payer: Medicare Other

## 2022-02-02 VITALS — Ht 74.0 in | Wt 220.0 lb

## 2022-02-02 DIAGNOSIS — Z86010 Personal history of colonic polyps: Secondary | ICD-10-CM

## 2022-02-02 MED ORDER — PEG 3350-KCL-NA BICARB-NACL 420 G PO SOLR: 4000.0000 mL | Freq: Once | ORAL | 0 refills | Status: AC

## 2022-02-02 NOTE — Progress Notes (Signed)
No egg or soy allergy known to patient  No issues known to pt with past sedation with any surgeries or procedures Patient denies ever being told they had issues or difficulty with intubation  No FH of Malignant Hyperthermia Pt is not on diet pills Pt is not on  home 02  Pt is not on blood thinners  Pt denies issues with constipation  No A fib or A flutter   NO PA's for preps discussed with pt In PV today  Discussed with pt there will be an out-of-pocket cost for prep and that varies from $0 to 70 +  dollars - pt verbalized understanding  Pt instructed to use Singlecare.com or GoodRx for a price reduction on prep   PV completed over the phone.   Pt verified name, DOB, address and insurance during PV today. Pt states new medicare and supplemental insurance change, notified Marva to call pt to update insurance and instructed pt to bring his insurance card the day of the procedure.   Pt mailed instruction packet with copy of consent form to read and not return, and instructions.  Pt encouraged to call with questions or issues.  If pt has My chart, procedure instructions also sent via My Chart  Insurance confirmed with pt at [facility] today

## 2022-02-03 ENCOUNTER — Other Ambulatory Visit (HOSPITAL_COMMUNITY): Payer: Medicare Other | Attending: Psychiatry | Admitting: Licensed Clinical Social Worker

## 2022-02-03 DIAGNOSIS — F1110 Opioid abuse, uncomplicated: Secondary | ICD-10-CM | POA: Insufficient documentation

## 2022-02-03 DIAGNOSIS — G894 Chronic pain syndrome: Secondary | ICD-10-CM | POA: Diagnosis not present

## 2022-02-03 DIAGNOSIS — F341 Dysthymic disorder: Secondary | ICD-10-CM | POA: Diagnosis not present

## 2022-02-03 DIAGNOSIS — F1021 Alcohol dependence, in remission: Secondary | ICD-10-CM | POA: Diagnosis not present

## 2022-02-03 DIAGNOSIS — I10 Essential (primary) hypertension: Secondary | ICD-10-CM | POA: Insufficient documentation

## 2022-02-03 DIAGNOSIS — F419 Anxiety disorder, unspecified: Secondary | ICD-10-CM | POA: Insufficient documentation

## 2022-02-03 DIAGNOSIS — E785 Hyperlipidemia, unspecified: Secondary | ICD-10-CM | POA: Diagnosis not present

## 2022-02-03 DIAGNOSIS — Z6372 Alcoholism and drug addiction in family: Secondary | ICD-10-CM | POA: Diagnosis not present

## 2022-02-03 DIAGNOSIS — F1020 Alcohol dependence, uncomplicated: Secondary | ICD-10-CM | POA: Diagnosis present
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
## 2022-02-04 ENCOUNTER — Telehealth (HOSPITAL_COMMUNITY): Payer: Self-pay | Admitting: Licensed Clinical Social Worker

## 2022-02-04 NOTE — Telephone Encounter (Signed)
The therapist receives a voicemail from [name]'s significant other saying that he is drinking today. She also says that he has not been "truthful" saying that she believes that the reason he has been missing group is because he has been drinking and could not pass the drug screen.   The therapist attempts to return her call leaving a HIPAA-compliant voicemail. There is a ROI on file for her which is good through at least 02/06/22.  [address], [place], LCSW, [facility], [place] 02/04/2022

## 2022-02-05 ENCOUNTER — Other Ambulatory Visit (HOSPITAL_COMMUNITY): Payer: Medicare Other | Admitting: Licensed Clinical Social Worker

## 2022-02-05 DIAGNOSIS — F1021 Alcohol dependence, in remission: Secondary | ICD-10-CM

## 2022-02-05 NOTE — Progress Notes (Signed)
Daily Group Progress Note  Program: CD IOP   Group Time: 9 a.m. to 12 p.m.  Type of Therapy: Process and Psychoeducational   Topic: The therapist checks in with group members, assesses for SI/HI/psychosis and overall level of functioning. The therapist inquires about sobriety date and number of community support meetings attended since last session. The therapist facilitates a discussion on addiction as a brain-based disease vs. a character defect explaining the reason that persons, especially in early recovery, should not trust their brains. The therapist also facilitates a discussion on what is meant by having a reservation and on the concept of living "life on life's terms." The therapist shows two videos: one on abstinence vs. recovery and the other on sober support eliciting feedback from group members about the content.   Summary: Vernon Fowler presents for group rating his depression as a "1" and anxiety as a "3" with no SI or HI. During the discussion on reservations, Vernon Fowler admits that he continues to think that if anything were to happen to one of his kids that he would drink; however, at the same time, he talks about two women he knows from [place] who each lost a child to opioid overdose who got through it without drinking. He says that he continues to attend two AA meetings per day; one live and one virtual. Although he rates his anxiety as a "3," his affect appears more anxious than usual.   Progress Towards Goals: Vernon Fowler reports no alcohol use.    UDS collected: No  Results: No  AA/NA attended?: Yes  Sponsor?: Yes  Individual Time: 12 p.m. to 12:45 p.m.  The therapist meets with Vernon Fowler after group to discuss the phone call that the therapist received from Vernon Fowler's significant other. He says that he knows that she called and says that she thought that he was drinking based on the way that he was acting; however, he says that he is not drinking. He does admit to still feeling "squirrelly" and  thinking about drinking but says that the medication is preventing him from having cravings.  He continues to worry about the situation with his daughter with the baby but also worries about a situation with his other daughter who does not speak with him. He says that he resisted his temptation to reach out to the estranged daughter and wondered if he should [place] and say to her what the therapist modeled in last group or wait until another incident happens in the future. The therapist suggests that waiting would likely be the most prudent course of action.  The therapist inquires about Vernon Fowler's caffeine consumption as he is drinking a large coffee and very anxious. He admits to drinking about a half pot of coffee per day and it is recommended that he reduce this until things are better with his anxiety.  The therapist addresses Vernon Fowler's self-admitted attempts to "control" the situations with his daughter and how he can turn these situations over to his Higher Power and hopefully have serenity to accept the things he cannot control. The therapist suggests some coping statements he can use in response to some irrational thinking.   The therapist discusses how this situation connects to the latter three steps in [place] with Vernon Fowler saying that he is now on Step Eight having previously never gotten past Step Five.   Vernon Fowler, [place], LCSW, [facility], [place] 02/05/2022

## 2022-02-08 ENCOUNTER — Other Ambulatory Visit (HOSPITAL_COMMUNITY): Payer: Medicare Other | Admitting: Licensed Clinical Social Worker

## 2022-02-08 ENCOUNTER — Telehealth (HOSPITAL_COMMUNITY): Payer: Self-pay | Admitting: Licensed Clinical Social Worker

## 2022-02-08 DIAGNOSIS — F1021 Alcohol dependence, in remission: Secondary | ICD-10-CM | POA: Diagnosis not present

## 2022-02-08 NOTE — Telephone Encounter (Signed)
The therapist leaves a HIPAA-compliant voicemail in response to her voicemail from Friday.   In the voicemail, the therapist lets her know that the ROI which her partner signed expired on 02/06/22 such that he can receive information from her; however, cannot release information to her.  [address] [place], LCSW, [facility], LCAS 02/08/2022

## 2022-02-08 NOTE — Progress Notes (Signed)
Daily Group Progress Note  Program: CD IOP   Group Time: 9 a.m. to 12 p.m.   Type of Therapy: Process and Psychoeducational   Topic: The therapist checks in with group members, assesses for SI/HI/psychosis and overall level of functioning. The therapist inquires about sobriety date and number of community support meetings attended since last session. The therapist introduces a new member to group and shares information on the risks associated with Delta 8. He also shows a video that illustrates that there are no such things as a "soft" or "hard" drug as the brain cannot tell the difference between them. He discusses process addictions such as gambling or sex addictions and talks about Gamblers' Anonymous. He covers Matrix Module RP17, "Take Care of Yourself."  Summary: Vernon Fowler presents for group rating his depression as a "1" and anxiety as a "3" with no SI or HI. He introduces himself to the new group member telling the story of how he came to be in [facility]. He brings two bottles of CBD oil to group which appear identical. He shows how one bottle indicates that it has not THC while the other bottle has Delta-8. He says that he has used the CBD; however, his partner has at times used Delta-8. Vernon Fowler says that he accidentally drank the bottle with the Delta-8 noting that he started feeling strange and then awful. He says that he also used some NyQuil this weekend as well. In talking about caffeine consumption, he says that he drinks about 2/3rds of a pot per day and wants to cut down. He says that he needs to start using his elliptical every other day having used it only twice a week if that. He discusses a story that he heard on NPR in which loneliness and boredom were identified as the two biggest risk factors for relapse. He questions if shopping is something about which he should be concerned as he has been getting a lot more packages from [place] since he quit drinking but after discussing this with the  group realizes that most items are health related. Vernon Fowler talks about one of his issues in the past being with thinking that he could "get away with" drinking when his partner was gone for the weekend with the therapist noting that this extra excitement or thrill likely added to the drinking experience.    Progress Towards Goals: Vernon Fowler reports no alcohol use but does say that he took some Nyquil and accidentally drank CBD oil with Delta-8.   UDS collected: Yes  Results: No  AA/NA attended?: Yes  Sponsor?: Yes   [name], [place], [place], [facility], [place] 02/08/2022

## 2022-02-10 ENCOUNTER — Encounter (HOSPITAL_COMMUNITY): Payer: Self-pay | Admitting: Medical

## 2022-02-10 ENCOUNTER — Other Ambulatory Visit (HOSPITAL_COMMUNITY): Payer: Medicare Other | Admitting: Licensed Clinical Social Worker

## 2022-02-10 DIAGNOSIS — F1021 Alcohol dependence, in remission: Secondary | ICD-10-CM

## 2022-02-10 DIAGNOSIS — F1110 Opioid abuse, uncomplicated: Secondary | ICD-10-CM

## 2022-02-10 DIAGNOSIS — E890 Postprocedural hypothyroidism: Secondary | ICD-10-CM

## 2022-02-10 DIAGNOSIS — Z811 Family history of alcohol abuse and dependence: Secondary | ICD-10-CM

## 2022-02-10 DIAGNOSIS — F419 Anxiety disorder, unspecified: Secondary | ICD-10-CM

## 2022-02-10 DIAGNOSIS — Z6372 Alcoholism and drug addiction in family: Secondary | ICD-10-CM

## 2022-02-10 DIAGNOSIS — G894 Chronic pain syndrome: Secondary | ICD-10-CM

## 2022-02-10 DIAGNOSIS — I10 Essential (primary) hypertension: Secondary | ICD-10-CM

## 2022-02-10 DIAGNOSIS — F341 Dysthymic disorder: Secondary | ICD-10-CM

## 2022-02-10 DIAGNOSIS — E785 Hyperlipidemia, unspecified: Secondary | ICD-10-CM

## 2022-02-10 NOTE — Progress Notes (Signed)
Daily Group Progress Note  Program: CD IOP   Group Time: 9 a.m. to 11:50 a.m.  Type of Therapy: Process and Psychoeducational   Topic: The therapist checks in with group members, assesses for SI/HI/psychosis and overall level of functioning. The therapist inquires about sobriety date and number of community support meetings attended since last session. The therapist provides group members with a handout with character defects and corresponding character assets. The therapist has the group members watch a video on the link between character defects and addiction and facilitates a discussion on this topic. As the video mentions the [facility], the therapist provides a brief explanation of the Enneagram and allows a few group members to take a short, Enneagram type test. The therapist covers Matrix module RP 18 Emotional Triggers.   Summary: Vernon Fowler presents for group rating his depression as a "1" and anxiety as a "3" with no SI or HI. In talking about working steps, Vernon Fowler says that he is waiting on working his 8th Step. He answers questions from other group members who have not gotten a Publishing copy or started step work about how this process goes. In discussing emotional triggers. Vernon Fowler says that the only feelings that he has of being deprived are wishing that he could drink socially when at certain events. He says that anxiety is his biggest trigger. When the therapist inquiries about social supports, it sounds like Vernon Fowler does not have any friends as he says that his best friend died in the car accident.   Progress Towards Goals: Vernon Fowler reports no alcohol use.     UDS collected: No  Results: No  AA/NA attended?: Yes  Sponsor?: Yes   [name], [place], [place], [facility], [place] 02/10/2022
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
## 2022-02-12 ENCOUNTER — Other Ambulatory Visit (HOSPITAL_COMMUNITY): Payer: Medicare Other | Admitting: Licensed Clinical Social Worker

## 2022-02-12 DIAGNOSIS — F1021 Alcohol dependence, in remission: Secondary | ICD-10-CM | POA: Diagnosis not present

## 2022-02-12 NOTE — Progress Notes (Signed)
Daily Group Progress Note  Program: CD IOP   Group Time: 9 a.m. to 12 p.m.   Type of Therapy: Process and Psychoeducational   Topic: The therapist checks in with group members, assesses for SI/HI/psychosis and overall level of functioning. The therapist inquires about sobriety date and number of community support meetings attended since last session. The therapist facilitates a discussion on the reasons that picking up a chip at Twelve Step meetings could be beneficial. The therapist also normalizes the fact that people in recovery will likely encounter others not at the same stage of change who are being dishonest to themselves in others; however, it is important that the recovering person not allow these individuals to become a barrier to progress. The therapist discusses how working a 4th step can be risky as it can trigger feelings of guilty and shame and talks about ways to mitigate going into a shame spiral. The therapist shows a video by Dr. Rachel Moulds on The Brain and Recovery and answers group members' questions about the content and how this information pertains to their recovery.   Summary: Vernon Fowler presents for group rating his depression as a "2" and anxiety as a "3" with no SI or HI. The therapist brings him a book on being an ACOA loaned to him by the P.A. which is to hopefully help [name] to work on becoming less enmeshed with his adult daughters. When another group member talks about never wanting to get another Sponsor after he found out that the one Sponsor that he did have was using drugs, Vernon Fowler shares about how the first Sponsor with whom he met ended up leaving a drink behind at [employer] house which Vernon Fowler discovered was full of alcohol. Vernon Fowler notes that it was not until he got his current Sponsor who was his 3rd that he found the right one for him. Thus, Vernon Fowler encourages this other group member to persist in finding the right Sponsor. After watching the video on The Brain and  Recovery, Vernon Fowler admits that he is confused about what the doctor in the video meant when he said that addiction is a disorder of pleasure. The therapist explains that addiction prevents individuals from being able to experience pleasure from activities which normally cause please like walking on a beach, seeing a sunset, etc. while only allowing pleasure when using the illicit substance. The therapist notes that it is typically after a year of recovery that the brain recovers such that a person in recovery begins to experience pleasure to things not substance related.   Progress Towards Goals: Vernon Fowler reports no alcohol use.       UDS collected: No  Results: No  AA/NA attended?: Yes  Sponsor?: Yes   [name], [place], [place], [facility], [place] 02/12/2022

## 2022-02-15 ENCOUNTER — Encounter (HOSPITAL_COMMUNITY): Payer: Medicare Other

## 2022-02-15 ENCOUNTER — Telehealth (HOSPITAL_COMMUNITY): Payer: Self-pay | Admitting: Licensed Clinical Social Worker

## 2022-02-15 NOTE — Telephone Encounter (Signed)
Vernon Fowler leaves a voicemail on 02/14/22 saying that he will not be in group on 02/15/22 as he will be drinking his prep for his scheduled colonoscopy on 02/16/22.  [name], [place], LCSW, [facility], [place] 02/15/2022

## 2022-02-16 ENCOUNTER — Encounter: Payer: Self-pay | Admitting: Gastroenterology

## 2022-02-16 ENCOUNTER — Ambulatory Visit (AMBULATORY_SURGERY_CENTER): Payer: Medicare Other | Admitting: Gastroenterology

## 2022-02-16 VITALS — BP 141/89 | HR 57 | Temp 98.4°F | Resp 14 | Ht 74.0 in | Wt 220.0 lb

## 2022-02-16 DIAGNOSIS — Z09 Encounter for follow-up examination after completed treatment for conditions other than malignant neoplasm: Secondary | ICD-10-CM

## 2022-02-16 DIAGNOSIS — D124 Benign neoplasm of descending colon: Secondary | ICD-10-CM

## 2022-02-16 DIAGNOSIS — D122 Benign neoplasm of ascending colon: Secondary | ICD-10-CM

## 2022-02-16 DIAGNOSIS — Z86010 Personal history of colonic polyps: Secondary | ICD-10-CM

## 2022-02-16 MED ORDER — SODIUM CHLORIDE 0.9 % IV SOLN: 500.0000 mL | INTRAVENOUS | Status: DC

## 2022-02-16 NOTE — Patient Instructions (Signed)
Handout on polyps and diverticulosis given.  High fiber diet, drink at least 64 ounces of water daily.  Add a bulking agent such as psyllium ( like metamucil).    YOU HAD AN ENDOSCOPIC PROCEDURE TODAY AT [facility]:   Refer to the procedure report that was given to you for any specific questions about what was found during the examination.  If the procedure report does not answer your questions, please call your gastroenterologist to clarify.  If you requested that your care partner not be given the details of your procedure findings, then the procedure report has been included in a sealed envelope for you to review at your convenience later.  YOU SHOULD EXPECT: Some feelings of bloating in the abdomen. Passage of more gas than usual.  Walking can help get rid of the air that was put into your GI tract during the procedure and reduce the bloating. If you had a lower endoscopy (such as a colonoscopy or flexible sigmoidoscopy) you may notice spotting of blood in your stool or on the toilet paper. If you underwent a bowel prep for your procedure, you may not have a normal bowel movement for a few days.  Please Note:  You might notice some irritation and congestion in your nose or some drainage.  This is from the oxygen used during your procedure.  There is no need for concern and it should clear up in a day or so.  SYMPTOMS TO REPORT IMMEDIATELY:  Following lower endoscopy (colonoscopy or flexible sigmoidoscopy):  Excessive amounts of blood in the stool  Significant tenderness or worsening of abdominal pains  Swelling of the abdomen that is new, acute  Fever of 100F or higher   For urgent or emergent issues, a gastroenterologist can be reached at any hour by calling [phone]. Do not use MyChart messaging for urgent concerns.    DIET:  We do recommend a small meal at first, but then you may proceed to your regular diet.  Drink plenty of fluids but you should avoid  alcoholic beverages for 24 hours.  ACTIVITY:  You should plan to take it easy for the rest of today and you should NOT DRIVE or use heavy machinery until tomorrow (because of the sedation medicines used during the test).    FOLLOW UP: Our staff will call the number listed on your records 24-72 hours following your procedure to check on you and address any questions or concerns that you may have regarding the information given to you following your procedure. If we do not reach you, we will leave a message.  We will attempt to reach you two times.  During this call, we will ask if you have developed any symptoms of COVID 19. If you develop any symptoms (ie: fever, flu-like symptoms, shortness of breath, cough etc.) before then, please call [phone].  If you test positive for Covid 19 in the 2 weeks post procedure, please call and report this information to [place].    If any biopsies were taken you will be contacted by phone or by letter within the next 1-3 weeks.  Please call us at [phone] if you have not heard about the biopsies in 3 weeks.    SIGNATURES/CONFIDENTIALITY: You and/or your care partner have signed paperwork which will be entered into your electronic medical record.  These signatures attest to the fact that that the information above on your After Visit Summary has been reviewed and is understood.  Full responsibility  of the confidentiality of this discharge information lies with you and/or your care-partner.

## 2022-02-16 NOTE — Op Note (Signed)
[facility] Patient Name: Vernon Fowler Procedure Date: 02/16/2022 10:37 AM MRN: [id number] Endoscopist: Thornton Park MD, MD Age: 66 Referring MD:  Date of Birth: 10-26-1955 Gender: Male Account #: [id number] Procedure:                Colonoscopy Indications:              Surveillance: Personal history of adenomatous                            polyps on last colonoscopy > 5 years ago                           3 tubular adenomas on colonoscopy 2017 in [place]                           No known family history of colon cancer or polyps Medicines:                Monitored Anesthesia Care Procedure:                Pre-Anesthesia Assessment:                           - Prior to the procedure, a History and Physical                            was performed, and patient medications and                            allergies were reviewed. The patient's tolerance of                            previous anesthesia was also reviewed. The risks                            and benefits of the procedure and the sedation                            options and risks were discussed with the patient.                            All questions were answered, and informed consent                            was obtained. Prior Anticoagulants: The patient has                            taken no previous anticoagulant or antiplatelet                            agents. ASA Grade Assessment: II - A patient with  mild systemic disease. After reviewing the risks                            and benefits, the patient was deemed in                            satisfactory condition to undergo the procedure.                           After obtaining informed consent, the colonoscope                            was passed under direct vision. Throughout the                            procedure, the patient's blood pressure, pulse, and                             oxygen saturations were monitored continuously. The                            Olympus CF-HQ190L [id number]) Colonoscope was                            introduced through the anus and advanced to the the                            cecum, identified by appendiceal orifice and                            ileocecal valve. A second forward view of the right                            colon was performed. The colonoscopy was performed                            with moderate difficulty due to a redundant colon.                            Successful completion of the procedure was aided by                            applying abdominal pressure. The patient tolerated                            the procedure well. The quality of the bowel                            preparation was good. The terminal ileum, ileocecal                            valve, appendiceal orifice, and rectum were  photographed. Scope In: 10:44:30 AM Scope Out: 11:04:25 AM Scope Withdrawal Time: 0 hours 16 minutes 51 seconds  Total Procedure Duration: 0 hours 19 minutes 55 seconds  Findings:                 The perianal and digital rectal examinations were                            normal.                           Multiple small and large-mouthed diverticula were                            found in the sigmoid colon and descending colon.                           A 2 mm polyp was found in the descending colon. The                            polyp was sessile. The polyp was removed with a                            cold snare. Resection and retrieval were complete.                            Estimated blood loss was minimal.                           Three sessile polyps were found in the ascending                            colon. The polyps were 2 to 3 mm in size. These                            polyps were removed with a cold snare. Resection                            and retrieval were  complete. Estimated blood loss                            was minimal.                           The exam was otherwise without abnormality on                            direct and retroflexion views. Complications:            No immediate complications. Estimated Blood Loss:     Estimated blood loss was minimal. Impression:               - Diverticulosis in the sigmoid colon and in the  descending colon.                           - One 2 mm polyp in the descending colon, removed                            with a cold snare. Resected and retrieved.                           - Three 2 to 3 mm polyps in the ascending colon,                            removed with a cold snare. Resected and retrieved.                           - The examination was otherwise normal on direct                            and retroflexion views. Recommendation:           - Patient has a contact number available for                            emergencies. The signs and symptoms of potential                            delayed complications were discussed with the                            patient. Return to normal activities tomorrow.                            Written discharge instructions were provided to the                            patient.                           - Continue present medications.                           - Await pathology results.                           - Repeat colonoscopy date to be determined after                            pending pathology results are reviewed for                            surveillance.                           - Follow a high fiber diet. Drink at least 64  ounces of water daily. Add a daily stool bulking                            agent such as psyllium (an exampled would be                            Metamucil).                           - Emerging evidence supports eating a diet of                             fruits, vegetables, grains, calcium, and yogurt                            while reducing red meat and alcohol may reduce the                            risk of colon cancer.                           - Thank you for allowing me to be involved in your                            colon cancer prevention. Thornton Park MD, MD 02/16/2022 11:09:11 AM This report has been signed electronically.

## 2022-02-16 NOTE — Progress Notes (Signed)
Referring Provider: Shon Baton, MD Primary Care Physician:  Shon Baton, MD  Indication for Procedure:  Colon cancer Surveillance   IMPRESSION:  Need for colon cancer surveillance Appropriate candidate for monitored anesthesia care  PLAN: Colonoscopy in the [place] today   HPI: Vernon Fowler is a 66 y.o. male presents for surveillance colonoscopy.  Prior endoscopic history: Colonoscopy performed by Dr. Lacey Jensen September 2017: 5 small polyps (3 adenomas, 1 hyperplastic, 1 benign tissue), AVM Surveillance colonoscopy recommended in 5 years  Father had peptic ulcer disease. No known family history of colon cancer or polyps. No family history of liver disease.  Paternal grandmother with pancreatic cancer.   Past Medical History:  Diagnosis Date   Alcohol abuse    Anxiety    Cancer ([place])    GERD (gastroesophageal reflux disease)    Hyperlipidemia    Hypertension    Sleep apnea    w/CPAP   Thyroid disease     Past Surgical History:  Procedure Laterality Date   COLONOSCOPY     COLONOSCOPY  04/2016   [place] polyps   FASCIOTOMY Right 10/2019   LAPAROTOMY N/A 12/13/2020   Procedure: EXPLORATORY LAPAROTOMY, LYSIS OF ADHESIONS;  Surgeon: Leighton Ruff, MD;  Location: [facility];  Service: General;  Laterality: N/A;   SHOULDER ARTHROSCOPY WITH ROTATOR CUFF REPAIR Right 03/01/2019   Procedure: Right shoulder arthroscopy, subacromial decompression, distal clavicle resection, rotator cuff repair;  Surgeon: Justice Britain, MD;  Location: [facility];  Service: Orthopedics;  Laterality: Right;   THYROIDECTOMY     1987   UPPER GASTROINTESTINAL ENDOSCOPY      Current Outpatient Medications  Medication Sig Dispense Refill   b complex vitamins tablet Take 1 tablet by mouth daily.     baclofen (LIORESAL) 10 MG tablet Take 10 mg by mouth 3 (three) times daily.     buPROPion (WELLBUTRIN XL) 150 MG 24 hr tablet Take by mouth.     carboxymethylcellulose (REFRESH PLUS) 0.5 % SOLN Place 1  drop into both eyes 2 (two) times daily as needed (dry eyes).      Coenzyme Q10 (COQ10) 100 MG CAPS Take 100 mg by mouth daily.      gabapentin (NEURONTIN) 300 MG capsule 3 (three) times daily as needed.     irbesartan (AVAPRO) 150 MG tablet Take 150 mg by mouth daily.     levothyroxine (SYNTHROID) 175 MCG tablet Take by mouth.     Multiple Vitamin (MULTIVITAMIN WITH MINERALS) TABS tablet Take 1 tablet by mouth daily. Men's 50+     Omega-3 Fatty Acids (FISH OIL) 1000 MG CAPS Take 1,000 mg by mouth daily.     omeprazole (PRILOSEC) 40 MG capsule TAKE ONE CAPSULE BY MOUTH TWICE A DAY 180 capsule 3   pregabalin (LYRICA) 150 MG capsule Take 1 capsule (150 mg total) by mouth in the morning, at noon, and at bedtime. 90 capsule 2   rosuvastatin (CRESTOR) 10 MG tablet Take 10 mg by mouth at bedtime.     sertraline (ZOLOFT) 100 MG tablet Take by mouth.     thiamine (VITAMIN B-1) 100 MG tablet Take 100 mg by mouth daily.     traZODone (DESYREL) 100 MG tablet Take 100 mg by mouth at bedtime.     diphenhydrAMINE (BENADRYL) 12.5 MG/5ML elixir Take by mouth daily as needed.     doxycycline (VIBRA-TABS) 100 MG tablet Take by mouth. (Patient not taking: Reported on 3/71/0626)     folic acid (FOLVITE) 1 MG tablet folic  acid 1 mg tablet  TAKE ONE TABLET BY MOUTH EVERY DAY (Patient not taking: Reported on 02/16/2022)     gabapentin (NEURONTIN) 600 MG tablet Take 1,200 mg by mouth 2 (two) times daily. (Patient not taking: Reported on 02/16/2022)     hydrOXYzine (VISTARIL) 50 MG capsule hydroxyzine pamoate 50 mg capsule  TAKE ONE CAPSULE BY MOUTH EVERY 6 HOURS AS NEEDED     naltrexone (DEPADE) 50 MG tablet Take by mouth. (Patient not taking: Reported on 02/16/2022)     predniSONE (DELTASONE) 10 MG tablet Take by mouth. (Patient not taking: Reported on 02/16/2022)     propranolol (INDERAL) 10 MG tablet Take 10 mg by mouth daily as needed (anxiety).     traZODone (DESYREL) 50 MG tablet Take by mouth. (Patient not taking:  Reported on 02/16/2022)     Current Facility-Administered Medications  Medication Dose Route Frequency Provider Last Rate Last Admin   0.9 %  sodium chloride infusion  500 mL Intravenous Once Thornton Park, MD       0.9 %  sodium chloride infusion  500 mL Intravenous Continuous Thornton Park, MD        Allergies as of 02/16/2022 - Review Complete 02/16/2022  Allergen Reaction Noted   Sulfa antibiotics Rash 03/31/2011    Family History  Problem Relation Age of Onset   Cancer Mother    Anxiety disorder Mother    Diabetes Father    Hyperlipidemia Father    Hypertension Father    Cancer Maternal Grandmother    Cancer Paternal Grandmother    Heart disease Paternal Grandfather    Anxiety disorder Daughter    Colon cancer Neg Hx    Esophageal cancer Neg Hx    Stomach cancer Neg Hx    Rectal cancer Neg Hx    Colon polyps Neg Hx      Physical Exam: General:   Alert,  well-nourished, pleasant and cooperative in NAD Head:  Normocephalic and atraumatic. Eyes:  Sclera clear, no icterus.   Conjunctiva pink. Mouth:  No deformity or lesions.   Neck:  Supple; no masses or thyromegaly. Lungs:  Clear throughout to auscultation.   No wheezes. Heart:  Regular rate and rhythm; no murmurs. Abdomen:  Soft, non-tender, nondistended, normal bowel sounds, no rebound or guarding.  Msk:  Symmetrical. No boney deformities LAD: No inguinal or umbilical LAD Extremities:  No clubbing or edema. Neurologic:  Alert and  oriented x4;  grossly nonfocal Skin:  No obvious rash or bruise. Psych:  Alert and cooperative. Normal mood and affect.     Studies/Results: No results found.    Vernon Fowler L. Tarri Glenn, MD, MPH 02/16/2022, 10:27 AM

## 2022-02-16 NOTE — Progress Notes (Signed)
VSS, transported to PACU °

## 2022-02-16 NOTE — Progress Notes (Signed)
Called to room to assist during endoscopic procedure.  Patient ID and intended procedure confirmed with present staff. Received instructions for my participation in the procedure from the performing physician.  

## 2022-02-17 ENCOUNTER — Other Ambulatory Visit (HOSPITAL_COMMUNITY): Payer: Medicare Other | Admitting: Licensed Clinical Social Worker

## 2022-02-17 ENCOUNTER — Telehealth: Payer: Self-pay

## 2022-02-17 DIAGNOSIS — F1021 Alcohol dependence, in remission: Secondary | ICD-10-CM | POA: Diagnosis not present

## 2022-02-17 NOTE — Telephone Encounter (Signed)
Left message

## 2022-02-17 NOTE — Progress Notes (Signed)
Daily Group Progress Note  Program: [facility] IOP   Group Time: 9 a.m. to 12 p.m.   Type of Therapy: Process and Psychoeducational   Topic: The therapist checks in with group members, assesses for SI/HI/psychosis and overall level of functioning. The therapist inquires about sobriety date and number of community support meetings attended since last session. The therapist introduces two new group members having members share their stories of how they came to be in [facility]. The therapist focuses on the importance of having a non-using support system in order to be successful and the reason that AA, NA, or some other non-using fellowship is encouraged. The therapist also educates group members on the potential hazards of various products such as mouthwash with alcohol, "non-alcoholic" beer, etc. on a person's sobriety. The therapist reinforces the need to avoid triggers and create barriers to using in one's life and the need to remain active with activities that gives one's life purpose.   Summary: Vernon Fowler presents for group rating his depression as a "2" and anxiety as a "3" with no SI or HI. [name] reviews the results of his UDS saying that the Delta-8 that he drank came back on the test as Delta-9. The therapist questions Vernon Fowler as to what his thought process was in drinking the NyQuil with Vernon Fowler noting that he drank it to help him get to sleep. This leads to a discussion of the main topic in group today aside from introductions which is the need for people in recovery to avoid any substances that activate the addictive neuropathway or "rat brain." Vernon Fowler concludes that he will get rid of NyQuil and any other product in his house containing alcohol. He says that there is no longer any Delta-8 in his house.    Progress Towards Goals: Vernon Fowler reports no alcohol use.     UDS collected: Yes  Results: Yes; positive for ETOH and Delta-9   AA/NA attended?: Yes  Sponsor?: Yes   [name], [place], [place], [facility],  [place] 02/17/2022

## 2022-02-18 DIAGNOSIS — L570 Actinic keratosis: Secondary | ICD-10-CM | POA: Insufficient documentation

## 2022-02-19 ENCOUNTER — Other Ambulatory Visit (HOSPITAL_COMMUNITY): Payer: Medicare Other | Admitting: Licensed Clinical Social Worker

## 2022-02-19 DIAGNOSIS — F1021 Alcohol dependence, in remission: Secondary | ICD-10-CM | POA: Diagnosis not present

## 2022-02-22 ENCOUNTER — Encounter (HOSPITAL_COMMUNITY): Payer: Medicare Other

## 2022-02-22 ENCOUNTER — Telehealth (HOSPITAL_COMMUNITY): Payer: Self-pay | Admitting: Licensed Clinical Social Worker

## 2022-02-23 ENCOUNTER — Telehealth (HOSPITAL_COMMUNITY): Payer: Self-pay | Admitting: Licensed Clinical Social Worker

## 2022-02-23 NOTE — Telephone Encounter (Signed)
The therapist receives a voicemail from [place] saying that he will not be in group again on Wednesday of this week as he is reportedly at his daughter's again helping out with some sort of family situation but says that he will be in group on Friday.  The therapist attempts to reach [facility] leaving a HIPAA-compliant voicemail asking that Vernon Fowler arrive for group early possibly at 8:30 a.m. so that this therapist can meet with him individually prior to group.  [name], MA, LCSW, [facility], LCAS 02/23/2022

## 2022-02-24 ENCOUNTER — Encounter (HOSPITAL_COMMUNITY): Payer: Medicare Other

## 2022-02-26 ENCOUNTER — Other Ambulatory Visit (HOSPITAL_COMMUNITY): Payer: Medicare Other | Admitting: Licensed Clinical Social Worker

## 2022-02-26 DIAGNOSIS — F1021 Alcohol dependence, in remission: Secondary | ICD-10-CM | POA: Diagnosis not present

## 2022-02-26 NOTE — Progress Notes (Signed)
Daily Group Progress Note  Program: CD IOP  Individual Time: 8:40 to 9 a.m.  The therapist meets with Vernon Fowler before group and shares with him the data that has caused this therapist and the P.A. to conclude that Vernon Fowler has relapsed and returned to drinking.  Vernon Fowler attempts to rationalize the most recent failed drug test by saying that the discussion about getting rid of the NyQuil did not happen until after the discussion about the need for him to get rid of the NyQuil.  The therapist notes that assuming that his failed drug tests were due to NyQuil that Vernon Fowler stated just before his first failed drug test that he hoped that the NyQuil he drank the weekend before would not cause a failed drug test knowing how sensitive the test was. After receiving the first failed test which would have confirmed that it would cause a failed drug test, Vernon Fowler would have to have knowingly drunk it in spite of this.   He continues to hold to the claim that it was NyQuil which caused both failed screens but says that his using it to help him sleep is no different that using wine for the same thing. He goes on to say that he had a relapse this weekend when his girlfriend accused him of having a relationship with one of the group members over whom she is jealous when she saw that Vernon Fowler text messaged her. Vernon Fowler says that nothing upsets him more than being accused of something he has not done which led to his relapse this weekend. He says that he will need to make this weekend his new sobriety date with the therapist suggesting that he needs to actually push it back further based on his failed tests for alcohol which predates this past weekend.   The therapist emphasizes the importance of Vernon Fowler opening up to this therapist, his Sponsor, and group about his relapse.   Group Time: 9 a.m. to 12 p.m.  Type of Therapy: Process and Psychoeducational   Topic: The therapist checks in with group members, assesses for SI/HI/psychosis  and overall level of functioning. The therapist inquires about sobriety date and number of community support meetings attended since last session. The therapist facilities a discussion on several topics including medication assisted treatment; spontaneous remission; the way in which the same disease, addiction, manifests differently in different people; and the family dynamics of addiction with a focus on co-dependency. The therapist also talks about the July 4th Holiday as being a trigger for many to use drugs and alcohol.   Summary: Vernon Fowler presents for group rating his depression as a "3" and anxiety as a "5" with no SI or HI. When the therapist asks if anyone in group has a new sobriety date, another member in group admits to a recent slip saying that she needs to change her sobriety date; however, in spite of having just discussed this with this therapist this morning before group, Vernon Fowler remains completely silent about his relapse this weekend. Rather than discuss his own issues, Vernon Fowler functions in his normal mentoring role towards group members recommending that they read the [employer] he brought to the group room as it has an article on addiction and the family.  He says that purchasing Grapevines for Vernon Fowler is his "contribution" to Vernon Fowler with another group member saying that Vernon Fowler is known for this in [place].   At the conclusion of group, the therapist asks if Vernon Fowler has a few minutes after group which he does  not as he has to go to a meeting that he helps run. He asks if the therapist would like for him to come in a little early again like today to see him before group with with the therapist answering in the affirmative.   Progress Towards Goals: Vernon Fowler reports having drunk this past weekend.   UDS collected: Yes  Results: No  AA/NA attended?: Yes  Sponsor?: Yes   [name], [place], [place], [facility], [place] 02/26/2022

## 2022-03-01 ENCOUNTER — Encounter: Payer: Self-pay | Admitting: Medical

## 2022-03-01 ENCOUNTER — Other Ambulatory Visit (HOSPITAL_COMMUNITY): Payer: Medicare Other | Attending: Psychiatry | Admitting: Licensed Clinical Social Worker

## 2022-03-01 DIAGNOSIS — F419 Anxiety disorder, unspecified: Secondary | ICD-10-CM | POA: Insufficient documentation

## 2022-03-01 DIAGNOSIS — Z6372 Alcoholism and drug addiction in family: Secondary | ICD-10-CM | POA: Insufficient documentation

## 2022-03-01 DIAGNOSIS — F1020 Alcohol dependence, uncomplicated: Secondary | ICD-10-CM | POA: Diagnosis present

## 2022-03-01 DIAGNOSIS — F341 Dysthymic disorder: Secondary | ICD-10-CM | POA: Diagnosis not present

## 2022-03-01 DIAGNOSIS — E785 Hyperlipidemia, unspecified: Secondary | ICD-10-CM | POA: Insufficient documentation

## 2022-03-01 DIAGNOSIS — G894 Chronic pain syndrome: Secondary | ICD-10-CM | POA: Insufficient documentation

## 2022-03-01 DIAGNOSIS — I10 Essential (primary) hypertension: Secondary | ICD-10-CM | POA: Insufficient documentation

## 2022-03-01 DIAGNOSIS — F1111 Opioid abuse, in remission: Secondary | ICD-10-CM | POA: Insufficient documentation

## 2022-03-01 DIAGNOSIS — F1021 Alcohol dependence, in remission: Secondary | ICD-10-CM

## 2022-03-01 DIAGNOSIS — C73 Malignant neoplasm of thyroid gland: Secondary | ICD-10-CM | POA: Insufficient documentation

## 2022-03-01 NOTE — Progress Notes (Signed)
Daily Group Progress Note  Program: CD IOP  Individual Time: 8:40 to 9 a.m.  The therapist meets with [name] before group and talks to him about not having reset his sobriety date and not having told the group about his relapse. He admits that he has not told his Sponsor about drinking wine this past weekend and only about the NyQuil due to some embarrassment.   He says that he worries telling group members will perhaps cause someone else in group to want to drink. He also admits reluctance to discuss his partner's jealousy issues again worrying about the male in group over whom she is jealous.  The therapist suggests that [name] is not powerful enough to cause other group members to drink and that the other male in group already told group about the situation with [name]'s partner. Further, the therapist informs [name] that he is not responsible for his partner's actions but only his own.  As his partner is in recovery, it is suggested that she needs to talk with her Sponsor and/or a therapist about her insecurity.   Group Time: 9 a.m. to 12 p.m.  Type of Therapy: Process and Psychoeducational   Topic: The therapist checks in with group members, assesses for SI/HI/psychosis and overall level of functioning. The therapist inquires about sobriety date and number of community support meetings attended since last session. The therapist facilities a discussion on persons with dual diagnosis and the need for integrated treatment. The therapist also focuses on the importance of a person in recovery checking in each day to see how he or she is doing emotionally so as to offset emotional relapse. The therapist notes that keeping a daily journal is a good way to do this an uncover possible precipitants for feelings of anxiety, anger, depression, etc.  Starting at 11 a.m. and for the duration of group, the group has a guest speaker, Ms. Jacques Navy with [facility] who speaks on the topic of self-care focusing  on nutrition, movement, and sleep.   Summary: [name] presents for group rating his depression as a "2" and anxiety as a "4" with no SI or HI.   He informs the group of his having drunk the weekend before last and about the situation with his partner saying that he will tell his Sponsor about the relapse this Thursday. He recounts the thoughts that his disease told him that led him to drink after he became angry at his partner for falsely accusing him of cheating. The therapist explores with [name] some other things he may have done and encourages him to work on a plan with his Sponsor regarding what he will do when his partner accuses him again in the future versus "if" she does.   The therapist encourages [name] to go back further to what was going on when he felt the need to drink NyQuil to help him sleep and when he was "squirrely." [name] initially cannot identify any stressors but is eventually able to recognize that worries that his stepson would start drinking and finances were stressing him. He says that he just put 40K into his daughter's closing and that his house is a "money pit" requiring some expensive repairs. [name] worries about his retirement money running out. He is encouraged to discuss these fears with his Sponsor.   During the discussion on self-care, [name] recounts the plan that his PCP encouraged him to start saying that he needs to begin with the walking.   Progress Towards Goals: [name] reports  having drunk this past weekend.   UDS collected: Yes  Results: No  AA/NA attended?: Yes  Sponsor?: Yes   [name], [place], [place], [employer] [facility], [place] 03/01/2022

## 2022-03-03 ENCOUNTER — Other Ambulatory Visit (HOSPITAL_COMMUNITY): Payer: Medicare Other | Admitting: Licensed Clinical Social Worker

## 2022-03-03 DIAGNOSIS — F1020 Alcohol dependence, uncomplicated: Secondary | ICD-10-CM | POA: Diagnosis not present

## 2022-03-03 DIAGNOSIS — F1021 Alcohol dependence, in remission: Secondary | ICD-10-CM

## 2022-03-03 NOTE — Progress Notes (Signed)
Daily Group Progress Note  Program: CD IOP  Group Time: 9 a.m. to 12 p.m.  Type of Therapy: Process and Psychoeducational   Topic: The therapist checks in with group members, assesses for SI/HI/psychosis and overall level of functioning. The therapist inquires about sobriety date and number of community support meetings attended since last session. The therapist facilities discussions on medication assisted treatment, how to deal with the stigma of addiction, the inherent problems with dual relationships, and the importance of discussing problems that group members are having in their lives that cause them to feel stress as a means of preventing emotional relapse. The therapist observes that as people recover that their lives become full of more productive activity which in turn can create more stress.   Summary: [name] presents for group rating his depression as a "2" and anxiety as a "4" with no SI or HI.   He does not have much to talk about today in regard to his own circumstances; however, the therapist uses the issues in [name]'s life and how they eventually led to relapse to discuss the importance of group members being more proactive in talking about what is happening in their lives in group whether they believe it relates to substance use or not.  When another group member shares that her husband also believed that it would be o.k. for her to only have two glasses of wine when it was not, [name] notes, "we can be very persuasive" sharing an example of when he once convinced his partner that he needed to continue drinking to avoid Dts when he really needed to go inpatient.   [name] shares his opinion that people in recovery should not give in to stigma and not be ashamed to admit that they have relapsed.   Progress Towards Goals: [name] reports no alcohol use since last group.   UDS collected: No  Results: No  AA/NA attended?: Yes  Sponsor?: Yes   [name], [place], [place], [facility],  [place] 03/03/2022

## 2022-03-05 ENCOUNTER — Other Ambulatory Visit (HOSPITAL_COMMUNITY): Payer: Medicare Other | Admitting: Licensed Clinical Social Worker

## 2022-03-05 DIAGNOSIS — F1020 Alcohol dependence, uncomplicated: Secondary | ICD-10-CM | POA: Diagnosis not present

## 2022-03-05 DIAGNOSIS — Z6372 Alcoholism and drug addiction in family: Secondary | ICD-10-CM

## 2022-03-05 DIAGNOSIS — F341 Dysthymic disorder: Secondary | ICD-10-CM

## 2022-03-05 DIAGNOSIS — F1021 Alcohol dependence, in remission: Secondary | ICD-10-CM

## 2022-03-05 DIAGNOSIS — E785 Hyperlipidemia, unspecified: Secondary | ICD-10-CM

## 2022-03-05 DIAGNOSIS — F419 Anxiety disorder, unspecified: Secondary | ICD-10-CM

## 2022-03-05 DIAGNOSIS — I10 Essential (primary) hypertension: Secondary | ICD-10-CM

## 2022-03-05 DIAGNOSIS — G894 Chronic pain syndrome: Secondary | ICD-10-CM

## 2022-03-05 DIAGNOSIS — E890 Postprocedural hypothyroidism: Secondary | ICD-10-CM

## 2022-03-05 DIAGNOSIS — Z811 Family history of alcohol abuse and dependence: Secondary | ICD-10-CM

## 2022-03-05 DIAGNOSIS — F1111 Opioid abuse, in remission: Secondary | ICD-10-CM
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
## 2022-03-08 ENCOUNTER — Encounter (HOSPITAL_COMMUNITY): Payer: Self-pay | Admitting: Licensed Clinical Social Worker

## 2022-03-08 ENCOUNTER — Telehealth (HOSPITAL_COMMUNITY): Payer: Self-pay | Admitting: Licensed Clinical Social Worker

## 2022-03-08 NOTE — Addendum Note (Signed)
Addended by: Dara Hoyer on: 03/08/2022 03:59 PM   Modules accepted: Level of Service

## 2022-03-08 NOTE — Telephone Encounter (Signed)
The therapist returns Vernon Fowler's call confirming his identity via two identifiers. His therapist is not seeing him for a substance use disorder so he wants to see this therapist for his Alcohol Use Disorder.   Before the therapist can schedule, [name] has to put him on hold and then the call drops.  [name], [place], LCSW, [facility], [place] 03/08/2022

## 2022-03-08 NOTE — Telephone Encounter (Signed)
The therapist calls Vernon Fowler back and schedules visits with him on 03/25/22, 04/01/22, and 04/08/22.  [name], [name], LCSW, [facility], [place] 03/08/2022
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
## 2022-03-10 ENCOUNTER — Encounter: Payer: Self-pay | Admitting: Gastroenterology

## 2022-03-25 ENCOUNTER — Ambulatory Visit (INDEPENDENT_AMBULATORY_CARE_PROVIDER_SITE_OTHER): Payer: Medicare Other | Admitting: Licensed Clinical Social Worker

## 2022-03-25 ENCOUNTER — Encounter (HOSPITAL_COMMUNITY): Payer: Self-pay

## 2022-03-25 DIAGNOSIS — F1020 Alcohol dependence, uncomplicated: Secondary | ICD-10-CM | POA: Diagnosis not present

## 2022-03-25 NOTE — Plan of Care (Signed)
[name] agrees verbally to the following Care Plan:  Problem: Alcohol Goal:  Vernon Fowler will abstain from alcohol 30/30 days per month based on self-report and/or UDS or breathlyzer by 03/25/2023. Outcome: Not Applicable

## 2022-03-25 NOTE — Progress Notes (Signed)
THERAPIST PROGRESS NOTE  Session Time: 1 p.m. to 1:30 p.m.  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 He gives [name] the homework of talking to his Sponsor about checking in concerning how he is doing emotionally on a daily basis to start attending to this to prevent emotional relapse leading to drinking.    Treatment Goals addressed: [name] will abstain from alcohol 30/30 days per month based on self-report and/or UDS or breathlyzer by 03/25/2023.  Summary: [name] presents for his initial therapy session since completing SA IOP. He says that he has to leave early as his A/C is out at his house. [name] says that he almost drank about 2 weeks ago when he went into a store and saw a couple of half cartons of wine; however, he thought of his Higher Power and the thought went away.  He is enrolled with [employer] and has a Chemical engineer now. He attended a three day, [place] AA Conference saying that it was a wonderful experience.   He talked to his other therapist about his "counter-dependency" and has been checking in with his Sponsor daily; however, not checking in about his emotions as recommended at his last IOP. He admits that he was worrying more about his younger daughter than he realized and is upset that his other daughter will have no contact with him.    Progress Towards Goals: Initial  Suicidal/Homicidal: No SI or HI reported  Plan: Return again in 1 weeks.  Diagnosis: Alcohol Use Disorder, Severe  Collaboration of Care: Other N/A  Patient/Guardian was advised Release of Information must be obtained prior to any record release in order to collaborate their care with an outside provider. Patient/Guardian was advised if they have not already done so to contact the registration department to sign all necessary forms in order for [place] to  release information regarding their care.   Consent: Patient/Guardian gives verbal consent for treatment and assignment of benefits for services provided during this visit. Patient/Guardian expressed understanding and agreed to proceed.   [name], [place], LCSW, [facility], [place] 03/25/2022

## 2022-03-31 ENCOUNTER — Telehealth (HOSPITAL_COMMUNITY): Payer: Self-pay | Admitting: Licensed Clinical Social Worker

## 2022-03-31 NOTE — Telephone Encounter (Signed)
The therapist receives a call from [place] saying that he is iffy concerning his therapy appointment as his daughter from whom he has been estranged sent him a text to let him know that she will be in town from [place] tomorrow and wants to see him; however, [name] does not know the exact time of the meeting. He says that he will call back with an update when it becomes available.   [name], [place], LCSW, [facility], [place] 03/31/2022

## 2022-04-01 ENCOUNTER — Ambulatory Visit (HOSPITAL_COMMUNITY): Payer: Medicare Other | Admitting: Licensed Clinical Social Worker

## 2022-04-01 ENCOUNTER — Encounter (HOSPITAL_COMMUNITY): Payer: Self-pay

## 2022-04-03 ENCOUNTER — Other Ambulatory Visit (HOSPITAL_COMMUNITY): Payer: Self-pay | Admitting: Medical

## 2022-04-05 ENCOUNTER — Other Ambulatory Visit (HOSPITAL_COMMUNITY): Payer: Self-pay | Admitting: Medical

## 2022-04-05 ENCOUNTER — Telehealth (HOSPITAL_COMMUNITY): Payer: Self-pay | Admitting: *Deleted

## 2022-04-05 NOTE — Telephone Encounter (Signed)
Pt called requesting refill of the Lyrica 150 mg TID, last sent on 11/11/21 with 2 refills. Pt has an upcoming appointment with Bill 8//10/23 however do not see any future appointments with you. Please review and advise.

## 2022-04-05 NOTE — Progress Notes (Signed)
Pt requesting refill of Lyrica Rx in June had 2 rerfills PDMP only showsw rx filled in June on 13th. Should have refills

## 2022-04-08 ENCOUNTER — Ambulatory Visit (HOSPITAL_COMMUNITY): Payer: Medicare Other | Admitting: Licensed Clinical Social Worker

## 2022-04-08 ENCOUNTER — Telehealth (HOSPITAL_COMMUNITY): Payer: Self-pay | Admitting: Licensed Clinical Social Worker

## 2022-04-08 NOTE — Telephone Encounter (Signed)
The therapist receives a call from [facility] cancelling his appointment for today due to a sore throat and fever. He says that he has surgery next Friday. The therapist informs him that his next availability is not until two weeks and that he has an appointment on 04/22/22 at 11 a.m.  Vernon Fowler says that he will callback to confirm holding off on scheduling for the time being.   [name], [place], LCSW, [facility], [place] 04/08/2022

## 2022-04-08 NOTE — Telephone Encounter (Signed)
Vernon Fowler leaves a voicemail confirming that he wants to attend his next therapy appointment on 04/22/22 at 11 a.m.   [name], [place], LCSW, [facility], [place] 04/08/2022

## 2022-04-16 ENCOUNTER — Other Ambulatory Visit (HOSPITAL_COMMUNITY): Payer: Self-pay | Admitting: Medical

## 2022-04-19 ENCOUNTER — Other Ambulatory Visit (HOSPITAL_COMMUNITY): Payer: Self-pay | Admitting: Medical

## 2022-04-19 MED ORDER — BACLOFEN 10 MG PO TABS: 10.0000 mg | ORAL_TABLET | Freq: Three times a day (TID) | ORAL | 0 refills | Status: DC

## 2022-04-22 ENCOUNTER — Ambulatory Visit (INDEPENDENT_AMBULATORY_CARE_PROVIDER_SITE_OTHER): Payer: Medicare Other | Admitting: Licensed Clinical Social Worker

## 2022-04-22 DIAGNOSIS — F1020 Alcohol dependence, uncomplicated: Secondary | ICD-10-CM | POA: Diagnosis not present

## 2022-04-22 NOTE — Telephone Encounter (Signed)
Rx done earlier

## 2022-04-22 NOTE — Progress Notes (Signed)
THERAPIST PROGRESS NOTE  Session Time: 11:10 a.m. to 11:40 a.m.  Type of Therapy: Individual   Therapist Response/Interventions: CBT/Therapist recommends that [name] stop frequenting this same store where he used to buy alcohol noting that certain roads, stores, etcetera can be part of a person's using ritual such that they can serve as a trigger to use.  The therapist supports [name]'s decision to add another tool in his recovery in talking the Naltrexone which helps him to not have to struggle over whether he will drink or not as he will know that doing so will not give him the effect desired such that doing so is pointless.   He suggests that ever if [name]'s oldest eventually talks to him after the twelve months she has required pass that some limit setting on his part may be needed.   Treatment Goals addressed: [name] will abstain from alcohol 30/30 days per month based on self-report and/or UDS or breathlyzer by 03/25/2023.  Summary: [name] presents a little late for his appointment saying that he has to leave early as he had to make an appointment at the [facility].  He spoke to his Sponsor about checking in regarding emotions as [name] grew up in a family that did not express emotions noting that he has a hard time at times identifying how he is feeling.  Thus, his Sponsor now asks him, "How are you feeling" during calls. He says that he realizes that he is anxious about his daughters and "anxious about the most ridiculous things."   He says that his worry about his oldest daughter is now a "sadness" as she broke up with her boyfriend which [name] believes is a good thing. [name] thought he was going to see her when she came to town but she did not see him and now her oldest daughter is not talking to his youngest daughter.   [name] says that he came very close to drinking again a couple of weeks ago. He was in a store where he used to buy alcohol and saw the display. He is taking Baclofen and is now  taking Naltrexone as almost drinking "kind of scared him."   Progress Towards Goals: Progressing  Suicidal/Homicidal: No SI or HI reported  Plan: Return again in 1 weeks.  Diagnosis: Alcohol Use Disorder, Severe  Collaboration of Care: Other N/A  Patient/Guardian was advised Release of Information must be obtained prior to any record release in order to collaborate their care with an outside provider. Patient/Guardian was advised if they have not already done so to contact the registration department to sign all necessary forms in order for [place] to release information regarding their care.   Consent: Patient/Guardian gives verbal consent for treatment and assignment of benefits for services provided during this visit. Patient/Guardian expressed understanding and agreed to proceed.   [name], [place], LCSW, [facility], [place] 04/22/2022

## 2022-04-29 ENCOUNTER — Ambulatory Visit (INDEPENDENT_AMBULATORY_CARE_PROVIDER_SITE_OTHER): Payer: Medicare Other | Admitting: Licensed Clinical Social Worker

## 2022-04-29 DIAGNOSIS — F1020 Alcohol dependence, uncomplicated: Secondary | ICD-10-CM | POA: Diagnosis not present
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
## 2022-04-30 ENCOUNTER — Telehealth (HOSPITAL_COMMUNITY): Payer: Self-pay | Admitting: Licensed Clinical Social Worker

## 2022-04-30 NOTE — Telephone Encounter (Signed)
Vernon Fowler calls to provide times that he can be rescheduled for another therapy appointment and to say that he sent the text to the guy he discussed in therapy and has not heard from him since.   The therapist attempts to reach him leaving a HIPAA-compliant voicemail letting him know that this therapist will schedule him to be seen on 05/06/22 at 11 a.m.  [name], [place], LCSW, [facility], [place] 04/30/2022

## 2022-05-05 ENCOUNTER — Telehealth (HOSPITAL_COMMUNITY): Payer: Self-pay | Admitting: Licensed Clinical Social Worker

## 2022-05-05 NOTE — Telephone Encounter (Signed)
The therapist returns Vernon Fowler's call from 05/01/22 leaving a HIPAA-compliant voicemail confirming his appointment with this therapist tomorrow, 05/06/22, at 11 a.m.  [name], [place], LCSW, [facility], [place] 05/05/2022

## 2022-05-06 ENCOUNTER — Ambulatory Visit (HOSPITAL_COMMUNITY): Payer: Medicare Other | Admitting: Licensed Clinical Social Worker

## 2022-05-06 ENCOUNTER — Telehealth (HOSPITAL_COMMUNITY): Payer: Self-pay | Admitting: Licensed Clinical Social Worker

## 2022-05-06 NOTE — Telephone Encounter (Signed)
The therapist attempts to reach [facility] by phone as he does not show for his 11 a.m. appointment today.  The therapist leaves a HIPAA-compliant voicemail with his callback number.  [name], [place], LCSW, [facility], [place] 05/06/2022

## 2022-05-07 ENCOUNTER — Telehealth (HOSPITAL_COMMUNITY): Payer: Self-pay | Admitting: Licensed Clinical Social Worker

## 2022-05-07 NOTE — Telephone Encounter (Signed)
The therapist receives a voicemail from [place] who says that he missed his appointment yesterday as he relapsed and is going to perform an "autopsy" of this relapse to try and figure out what happened.  He asks about rescheduling for new Thursday. The therapist attempts to reach him leaving a HIPAA-compliant voicemail noting that he has no availability on Thursday of next week but has times on Tuesday.  [name], MA, LCSW, [place], LCAS

## 2022-05-10 ENCOUNTER — Telehealth (HOSPITAL_COMMUNITY): Payer: Self-pay | Admitting: Licensed Clinical Social Worker

## 2022-05-10 NOTE — Telephone Encounter (Signed)
The therapist receives two almost identical voicemails from Vernon Fowler on Saturday and Sunday regarding his appointment availability.  The therapist attempts to reach him by phone leaving  HIPAA-compliant voicemail informing him that he can come in tomorrow at 11 a.m. and leave at 11:45 a.m. to make his meeting or can come in at 10 a.m. to 11 a.m.  The therapist informs him that he will put him on the schedule for 11 for the time being.  [name], [place], LCSW, [facility], [place] 05/10/2022

## 2022-05-11 ENCOUNTER — Ambulatory Visit (INDEPENDENT_AMBULATORY_CARE_PROVIDER_SITE_OTHER): Payer: Medicare Other | Admitting: Licensed Clinical Social Worker

## 2022-05-11 DIAGNOSIS — F1020 Alcohol dependence, uncomplicated: Secondary | ICD-10-CM | POA: Diagnosis not present
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
## 2022-05-18 ENCOUNTER — Ambulatory Visit (INDEPENDENT_AMBULATORY_CARE_PROVIDER_SITE_OTHER): Payer: Medicare Other | Admitting: Licensed Clinical Social Worker

## 2022-05-18 DIAGNOSIS — F1020 Alcohol dependence, uncomplicated: Secondary | ICD-10-CM
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
## 2022-05-25 ENCOUNTER — Ambulatory Visit (INDEPENDENT_AMBULATORY_CARE_PROVIDER_SITE_OTHER): Payer: Medicare Other | Admitting: Licensed Clinical Social Worker

## 2022-05-25 DIAGNOSIS — F1020 Alcohol dependence, uncomplicated: Secondary | ICD-10-CM
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
## 2022-06-01 ENCOUNTER — Ambulatory Visit (INDEPENDENT_AMBULATORY_CARE_PROVIDER_SITE_OTHER): Payer: Medicare Other | Admitting: Licensed Clinical Social Worker

## 2022-06-01 DIAGNOSIS — F1020 Alcohol dependence, uncomplicated: Secondary | ICD-10-CM

## 2022-06-01 NOTE — Progress Notes (Signed)
THERAPIST PROGRESS NOTE  Session Time: 11 a.m. to  11:55 a.m.  Type of Therapy: Individual   Therapist Response/Interventions: CBT/The therapist assists [name] in identifying the trigger for his recent desire to drink and works with him to reduce this trigger which he says he can do by watching a video recap of scoring in the games after they are done or possibly listening to the game on radio.  The therapist asks [name] for his reasons for not wanting to drink and talks about the importance of keeping these in mind as much as possible. He informs [name] that this office has psychiatrists with [name] wanting to make a virtual appointment before being seen for in-person visits.   Treatment Goals addressed: [name] will abstain from alcohol 30/30 days per month based on self-report and/or UDS or breathlyzer by 03/25/2023.   Summary: [name] presents saying that he called Medicare and is approved for [facility] again. He found that the Vivitrol shot was not covered.[name] has yet to talk with his partner about coming into a session with him. He is going to wait until they are at the beach as she is still in a state of distrust.   [name] says that he gets a major craving to drink alcohol once a day or every other day and will "sort of wait it out." He is drinking more coffee again having found that the headaches were caffeine-withdrawal related.   The therapist asks [name] to consider what was happening when he had his most recent craving which was two days ago and he is able to realize that it was a football game on TV.   He talks to the therapist about a need for a new psychiatrist noting that he no longer wants to see his former psychiatrist, Dr. Toy Care, as she put him on a benzodiazepine and apparently has a reputation for prescribing them per other people in [place]. He says that she told him that Wellbutrin and Zoloft are a good combination for anxiety and wants to assure this is the case before getting more refills.    Progress Towards Goals: Some progress  Suicidal/Homicidal: No SI or HI reported  Plan: [name] returns again in 2 weeks.  Diagnosis: Alcohol Use Disorder, Severe  Collaboration of Care: Other N/A  Patient/Guardian was advised Release of Information must be obtained prior to any record release in order to collaborate their care with an outside provider. Patient/Guardian was advised if they have not already done so to contact the registration department to sign all necessary forms in order for [place] to release information regarding their care.   Consent: Patient/Guardian gives verbal consent for treatment and assignment of benefits for services provided during this visit. Patient/Guardian expressed understanding and agreed to proceed.   [name], [place], LCSW, [facility], [place] 06/01/2022

## 2022-06-03 ENCOUNTER — Telehealth (HOSPITAL_COMMUNITY): Payer: Self-pay | Admitting: Licensed Clinical Social Worker

## 2022-06-03 NOTE — Telephone Encounter (Signed)
The therapist returns Vernon Fowler's call as he left a voicemail saying that he could not make his appointment with this therapist on 06/15/22; however, is free on the 18th, 19th, or 20th.   The therapist leaves a HIPAA-compliant voicemail letting Vernon Fowler know that this therapist is scheduling him for Thursday, 06/17/22 at 11 a.m. and asking him to call and confirm and/or let this therapist know if this time will not work.  [name], [place], LCSW, [facility], [place] 06/03/2022

## 2022-06-15 ENCOUNTER — Ambulatory Visit (HOSPITAL_COMMUNITY): Payer: Medicare Other | Admitting: Licensed Clinical Social Worker

## 2022-06-15 ENCOUNTER — Encounter (HOSPITAL_COMMUNITY): Payer: Medicare Other | Admitting: Psychiatry

## 2022-06-15 ENCOUNTER — Encounter (HOSPITAL_COMMUNITY): Payer: Self-pay

## 2022-06-15 NOTE — Progress Notes (Signed)
This encounter was created in error - please disregard.

## 2022-06-16 ENCOUNTER — Other Ambulatory Visit: Payer: Self-pay | Admitting: Internal Medicine

## 2022-06-17 ENCOUNTER — Ambulatory Visit (INDEPENDENT_AMBULATORY_CARE_PROVIDER_SITE_OTHER): Payer: Medicare Other | Admitting: Licensed Clinical Social Worker

## 2022-06-17 ENCOUNTER — Other Ambulatory Visit: Payer: Self-pay | Admitting: Internal Medicine

## 2022-06-17 DIAGNOSIS — F1020 Alcohol dependence, uncomplicated: Secondary | ICD-10-CM
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
## 2022-07-01 ENCOUNTER — Ambulatory Visit (INDEPENDENT_AMBULATORY_CARE_PROVIDER_SITE_OTHER): Payer: Medicare Other | Admitting: Licensed Clinical Social Worker

## 2022-07-01 ENCOUNTER — Other Ambulatory Visit: Payer: Self-pay | Admitting: Internal Medicine

## 2022-07-01 ENCOUNTER — Encounter: Payer: Self-pay | Admitting: Internal Medicine

## 2022-07-01 DIAGNOSIS — I712 Thoracic aortic aneurysm, without rupture, unspecified: Secondary | ICD-10-CM

## 2022-07-01 DIAGNOSIS — F1020 Alcohol dependence, uncomplicated: Secondary | ICD-10-CM | POA: Diagnosis not present

## 2022-07-01 DIAGNOSIS — F411 Generalized anxiety disorder: Secondary | ICD-10-CM

## 2022-07-01 NOTE — Progress Notes (Signed)
THERAPIST PROGRESS NOTE  Session Time: 11:03 a.m. to 12 p.m.  Type of Therapy: Individual   Therapist Response/Interventions: CBT/The therapist encourages Vernon Fowler to start tapering off caffeine and notes that many people in early recovery will over use caffeine so as to get a buzz with Vernon Fowler saying that this explains him exactly.  The therapist explores the reason that Vernon Fowler was reluctant to be honest about his slips or pick-up a start over chip encouraging him to be honest in the future if he were to run into this issue.   Treatment Goals addressed: Vernon Fowler will abstain from alcohol 30/30 days per month based on self-report and/or UDS or breathlyzer by 03/25/2023.   Summary: Vernon Fowler presents saying he is doing "good" noting that he is looking forward to seeing he psychiatrist here as his anxiety is "off the rails sometimes." There have been days in which he has thought, "I wish I had a benzo."  The therapist spends time talking to [facility] about his apparently excessive caffeine consumption in relation to his anxiety suggesting that Vernon Fowler may want to try slowly tapering off caffeine to see how his anxiety is during a period of being caffeine-free.  As Vernon Fowler admits that there were times that he was not honest about his relapses when in [facility], the therapist questions what the reason for this was with the conclusion being that Vernon Fowler was concerned about letting others down or what they might think of him for having drunk.   The therapist answers Vernon Fowler's questions about his medications with Vernon Fowler have not know that the Trazodone which he takes for his sleep is also an anti-depressant.    Progress Towards Goals: Progressing   Suicidal/Homicidal: No SI or HI reported  Plan: Vernon Fowler returns again in 2 weeks.  Diagnosis: Alcohol Use Disorder, Severe  Collaboration of Care: Other N/A  Patient/Guardian was advised Release of Information must be obtained prior to any record release in order to collaborate  their care with an outside provider. Patient/Guardian was advised if they have not already done so to contact the registration department to sign all necessary forms in order for [place] to release information regarding their care.   Consent: Patient/Guardian gives verbal consent for treatment and assignment of benefits for services provided during this visit. Patient/Guardian expressed understanding and agreed to proceed.   [name], [place], LCSW, [facility], [place] 07/01/2022

## 2022-07-05 ENCOUNTER — Ambulatory Visit (HOSPITAL_BASED_OUTPATIENT_CLINIC_OR_DEPARTMENT_OTHER): Payer: Medicare Other | Admitting: Psychiatry

## 2022-07-05 ENCOUNTER — Encounter (HOSPITAL_COMMUNITY): Payer: Self-pay | Admitting: Psychiatry

## 2022-07-05 DIAGNOSIS — F411 Generalized anxiety disorder: Secondary | ICD-10-CM | POA: Diagnosis not present

## 2022-07-05 DIAGNOSIS — F1021 Alcohol dependence, in remission: Secondary | ICD-10-CM | POA: Diagnosis not present

## 2022-07-05 MED ORDER — PROPRANOLOL HCL 10 MG PO TABS: 10.0000 mg | ORAL_TABLET | Freq: Three times a day (TID) | ORAL | 1 refills | Status: DC

## 2022-07-05 MED ORDER — BACLOFEN 10 MG PO TABS: 10.0000 mg | ORAL_TABLET | Freq: Three times a day (TID) | ORAL | 0 refills | Status: DC

## 2022-07-05 MED ORDER — SERTRALINE HCL 100 MG PO TABS: 150.0000 mg | ORAL_TABLET | Freq: Every day | ORAL | 1 refills | Status: DC

## 2022-07-05 NOTE — Patient Instructions (Addendum)
Thank you for attending your appointment today.   - Increase Zoloft to 150 mg daily - Continue Trazodone 100 at bedtime - Continue Pregablin 150 mg three times a day - Continue Baclofen 10 mg three times a day - Start Propranolol 10 mg three times a day as needed for anxiety - Discontinue Wellbutrin XL 150 mg - Discontinue BuSpar  Please do not make any changes to medications without first discussing with your provider. If you are experiencing a psychiatric emergency, please call 911 or present to your nearest emergency department. Additional crisis, medication management, and therapy resources are included below.  [facility]  [address], [place], [place] [address] [phone] or [phone] [facility] 24/7 FOR ANYONE [address] [place], [place] Fax: [phone] [email or web address] *Interpreters available *Accepts all insurance and uninsured for Urgent Care needs
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
## 2022-07-09 ENCOUNTER — Ambulatory Visit
Admission: RE | Admit: 2022-07-09 | Discharge: 2022-07-09 | Disposition: A | Discharge status: Home / Self Care | Payer: Medicare Other | Source: Ambulatory Visit | Attending: Internal Medicine | Admitting: Internal Medicine

## 2022-07-09 DIAGNOSIS — I712 Thoracic aortic aneurysm, without rupture, unspecified: Secondary | ICD-10-CM

## 2022-07-09 MED ORDER — IOPAMIDOL (ISOVUE-370) INJECTION 76%
75.0000 mL | Freq: Once | INTRAVENOUS | Status: AC | PRN
  Administered 2022-07-09: 75 mL via INTRAVENOUS

## 2022-07-14 ENCOUNTER — Telehealth (HOSPITAL_COMMUNITY): Payer: Self-pay | Admitting: Licensed Clinical Social Worker

## 2022-07-14 NOTE — Telephone Encounter (Signed)
Vernon Fowler leaves a voicemail saying that he has an ear ache and that one side of his head is swollen but that he believes that he will be attending his scheduled visit tomorrow but if not wanted the therapist to know what was happening.   As Vernon Fowler has a history of not showing when he calls to say that he may not attend, the therapist attempts to reach him by phone leaving a HIPAA-compliant voicemail asking him to call today if he determines that he will not show so as to avoid a late cancellation fee.  [name], [place], LCSW, [facility], LCAS 07/14/2022

## 2022-07-15 ENCOUNTER — Telehealth (HOSPITAL_COMMUNITY): Payer: Self-pay | Admitting: Licensed Clinical Social Worker

## 2022-07-15 ENCOUNTER — Ambulatory Visit (HOSPITAL_COMMUNITY): Payer: Medicare Other | Admitting: Licensed Clinical Social Worker

## 2022-07-15 NOTE — Telephone Encounter (Signed)
Vernon Fowler calls leaving a voicemail on 07/14/22 at 8:51 p.m. that he will not be able to make his appointment on 07/14/22 due to an ear infection in addiction to a cough and other symptoms. He says that he will be going to the urgent care in the morning.  [name], [place], LCSW, [facility], [place] 07/15/2022

## 2022-07-20 ENCOUNTER — Telehealth (HOSPITAL_COMMUNITY): Payer: Self-pay | Admitting: Licensed Clinical Social Worker

## 2022-07-20 NOTE — Telephone Encounter (Signed)
The therapist returns Vernon Fowler's call leaving a HIPAA-compliant voicemail concerning appointment availability next Thursday, 07/29/22.   [name], [place], LCSW, [facility], [place] 07/20/2022

## 2022-07-26 ENCOUNTER — Telehealth (HOSPITAL_COMMUNITY): Payer: Self-pay | Admitting: Licensed Clinical Social Worker

## 2022-07-26 NOTE — Telephone Encounter (Signed)
Vernon Fowler leaves two voicemails confirming that he can attend a therapy session with this therapist on 07/29/22 at 3 p.m.  [name], [place], LCSW, [facility], [place] 07/26/2022

## 2022-07-29 ENCOUNTER — Ambulatory Visit (INDEPENDENT_AMBULATORY_CARE_PROVIDER_SITE_OTHER): Payer: Medicare Other | Admitting: Licensed Clinical Social Worker

## 2022-07-29 DIAGNOSIS — F1021 Alcohol dependence, in remission: Secondary | ICD-10-CM | POA: Diagnosis not present

## 2022-07-29 NOTE — Progress Notes (Signed)
THERAPIST PROGRESS NOTE  Session Time: 3:05 a.m. to 4 p.m.  Type of Therapy: Individual   Therapist Response/Interventions: CBT/The therapist makes the observation that despite Vernon Fowler's claims that he has reduced his caffeine consumption that he has not. The therapist again recommends doing so in addition to resuming some form of regular exercise for stress reduction.  The therapist informs Vernon Fowler that he has the right to simply tell his daughter that his home is a non-alcohol home and that her boyfriend drinking is not an option which he recognizes she will understand. The therapist notes that Vernon Fowler has no business being around alcohol now at all and reminds him of the importance of being smart versus strong.   Treatment Goals addressed: Vernon Fowler will abstain from alcohol 30/30 days per month based on self-report and/or UDS or breathlyzer by 03/25/2023.   Summary: Vernon Fowler returns having met with Dr. Nelida Gores on 07/05/22. He says that he believes is anxiety is lower but still worries about things he should not worry about. Vernon Fowler has not really reduced his coffee drinking even asking if there is coffee in the break room that he can have today.   Vernon Fowler did a "sober" Thanksgiving and still has 05/10/22 as his sober date. He has had cravings to drink admitting that he had some over Thanksgiving but was with eight people in recovery.   He is going [place] a week from today to visit his daughter in [place] who he has not seen in a year. He will see his other daughter the 21st which leads to a discussion about this daughter's boyfriend's history of drinking.    Progress Towards Goals: Progressing   Suicidal/Homicidal: No SI or HI reported  Plan: Vernon Fowler returns again in 2 weeks.  Diagnosis: Alcohol Use Disorder, Severe  Collaboration of Care: Other N/A  Patient/Guardian was advised Release of Information must be obtained prior to any record release in order to collaborate their care with an  outside provider. Patient/Guardian was advised if they have not already done so to contact the registration department to sign all necessary forms in order for [place] to release information regarding their care.   Consent: Patient/Guardian gives verbal consent for treatment and assignment of benefits for services provided during this visit. Patient/Guardian expressed understanding and agreed to proceed.   [name], [place], LCSW, [facility], [place] 07/29/2022

## 2022-08-04 ENCOUNTER — Ambulatory Visit (HOSPITAL_COMMUNITY): Payer: Medicare Other | Admitting: Psychiatry

## 2022-08-12 ENCOUNTER — Encounter (HOSPITAL_COMMUNITY): Payer: Self-pay | Admitting: Licensed Clinical Social Worker

## 2022-08-12 ENCOUNTER — Ambulatory Visit (HOSPITAL_COMMUNITY): Payer: Medicare Other | Admitting: Medical

## 2022-08-12 ENCOUNTER — Ambulatory Visit (HOSPITAL_COMMUNITY): Payer: Medicare Other | Admitting: Licensed Clinical Social Worker

## 2022-08-13 ENCOUNTER — Telehealth (HOSPITAL_COMMUNITY): Payer: Self-pay | Admitting: Licensed Clinical Social Worker

## 2022-08-13 NOTE — Telephone Encounter (Signed)
The therapist returns [name]'s call leaving a HIPAA-compliant voicemail that [name] does not have an appointment with this therapist next week as he believes and that he will need to call back to speak with this therapist if interested in making one.  [name], [place], LCSW, [facility], [place] 08/13/2022

## 2022-08-16 ENCOUNTER — Other Ambulatory Visit (HOSPITAL_COMMUNITY): Payer: Self-pay | Admitting: Medical

## 2022-08-26 NOTE — Telephone Encounter (Signed)
No longer a patient

## 2022-08-27 ENCOUNTER — Other Ambulatory Visit (HOSPITAL_COMMUNITY): Payer: Self-pay | Admitting: Psychiatry

## 2022-08-27 ENCOUNTER — Telehealth (HOSPITAL_COMMUNITY): Payer: Self-pay | Admitting: *Deleted

## 2022-08-27 DIAGNOSIS — F411 Generalized anxiety disorder: Secondary | ICD-10-CM

## 2022-08-27 NOTE — Telephone Encounter (Signed)
Pt left VM requesting refills of the Lyrica 150 mg and Baclofen 10 mg. Pt has no appointments scheduled with Darlyne Russian, [name], or Dr. Nelida Gores. Writer returned call after speaking with Harold Hedge and advised that pt call [phone] to make appointments for f/u and medication refills. Pt advised office closes @ 1200 today and will be closed Monday, 08/30/22, in observance of the [place] holiday.

## 2022-08-31 ENCOUNTER — Telehealth (HOSPITAL_COMMUNITY): Payer: Self-pay | Admitting: Licensed Clinical Social Worker

## 2022-08-31 ENCOUNTER — Telehealth (HOSPITAL_COMMUNITY): Payer: Self-pay | Admitting: *Deleted

## 2022-08-31 NOTE — Telephone Encounter (Signed)
Patient called asking for refill of Baclofen and Lyrica. His next appointment is 09/02/22 with Darlyne Russian. Message sent to MD

## 2022-08-31 NOTE — Telephone Encounter (Signed)
Vernon Fowler leaves a voicemail saying that he receive a message from the Nurse at this office saying that he needed to make a medication appointment in order to get his medication refill and see this therapist.   He says that he is out-of-town but will be back the Monday after New Year's and will call again then.  [name], [place], LCSW, [facility], [place] 08/31/2022

## 2022-09-02 ENCOUNTER — Ambulatory Visit (HOSPITAL_COMMUNITY): Payer: Medicare Other | Admitting: Medical

## 2022-09-07 ENCOUNTER — Ambulatory Visit (HOSPITAL_COMMUNITY): Payer: Medicare Other | Admitting: Licensed Clinical Social Worker

## 2022-09-09 ENCOUNTER — Ambulatory Visit (HOSPITAL_COMMUNITY): Payer: Medicare Other | Admitting: Medical

## 2022-09-13 ENCOUNTER — Telehealth (HOSPITAL_COMMUNITY): Payer: Medicare Other | Admitting: Medical

## 2022-09-13 ENCOUNTER — Encounter (HOSPITAL_COMMUNITY): Payer: Self-pay

## 2022-09-13 ENCOUNTER — Encounter (HOSPITAL_COMMUNITY): Payer: Self-pay | Admitting: Medical

## 2022-09-13 NOTE — Progress Notes (Signed)
Patient ID: Vernon Fowler, male   DOB: 1956/05/25, 67 y.o.   MRN: [id number] Call went to VM Pt to reschedule for 09/16/2022 to avoid no show charge VM left

## 2022-09-14 ENCOUNTER — Ambulatory Visit (HOSPITAL_COMMUNITY): Payer: Medicare Other | Admitting: Licensed Clinical Social Worker

## 2022-09-16 ENCOUNTER — Telehealth (HOSPITAL_COMMUNITY): Payer: Self-pay | Admitting: Medical

## 2022-09-16 ENCOUNTER — Telehealth (HOSPITAL_BASED_OUTPATIENT_CLINIC_OR_DEPARTMENT_OTHER): Payer: Medicare Other | Admitting: Medical

## 2022-09-16 DIAGNOSIS — F419 Anxiety disorder, unspecified: Secondary | ICD-10-CM

## 2022-09-16 DIAGNOSIS — I10 Essential (primary) hypertension: Secondary | ICD-10-CM

## 2022-09-16 DIAGNOSIS — F1021 Alcohol dependence, in remission: Secondary | ICD-10-CM

## 2022-09-16 DIAGNOSIS — Z811 Family history of alcohol abuse and dependence: Secondary | ICD-10-CM

## 2022-09-16 DIAGNOSIS — Z6372 Alcoholism and drug addiction in family: Secondary | ICD-10-CM

## 2022-09-16 DIAGNOSIS — Z79899 Other long term (current) drug therapy: Secondary | ICD-10-CM

## 2022-09-16 DIAGNOSIS — G894 Chronic pain syndrome: Secondary | ICD-10-CM | POA: Diagnosis not present

## 2022-09-16 DIAGNOSIS — F341 Dysthymic disorder: Secondary | ICD-10-CM

## 2022-09-16 DIAGNOSIS — F1111 Opioid abuse, in remission: Secondary | ICD-10-CM | POA: Diagnosis not present

## 2022-09-16 DIAGNOSIS — E890 Postprocedural hypothyroidism: Secondary | ICD-10-CM

## 2022-09-16 DIAGNOSIS — F411 Generalized anxiety disorder: Secondary | ICD-10-CM | POA: Diagnosis not present

## 2022-09-16 DIAGNOSIS — E785 Hyperlipidemia, unspecified: Secondary | ICD-10-CM

## 2022-09-16 DIAGNOSIS — Z87891 Personal history of nicotine dependence: Secondary | ICD-10-CM

## 2022-09-16 MED ORDER — BACLOFEN 10 MG PO TABS: 10.0000 mg | ORAL_TABLET | Freq: Three times a day (TID) | ORAL | 2 refills | Status: AC

## 2022-09-16 MED ORDER — SERTRALINE HCL 100 MG PO TABS: 150.0000 mg | ORAL_TABLET | Freq: Every day | ORAL | 2 refills | Status: DC

## 2022-09-16 MED ORDER — PREGABALIN 150 MG PO CAPS: 150.0000 mg | ORAL_CAPSULE | Freq: Three times a day (TID) | ORAL | 2 refills | Status: DC
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
## 2022-09-26 ENCOUNTER — Other Ambulatory Visit (HOSPITAL_COMMUNITY): Payer: Self-pay | Admitting: Psychiatry

## 2022-09-26 DIAGNOSIS — F411 Generalized anxiety disorder: Secondary | ICD-10-CM

## 2022-10-04 ENCOUNTER — Ambulatory Visit (HOSPITAL_COMMUNITY): Payer: Medicare Other | Admitting: Licensed Clinical Social Worker

## 2022-10-05 ENCOUNTER — Ambulatory Visit (INDEPENDENT_AMBULATORY_CARE_PROVIDER_SITE_OTHER): Payer: Medicare Other | Admitting: Licensed Clinical Social Worker

## 2022-10-05 DIAGNOSIS — F411 Generalized anxiety disorder: Secondary | ICD-10-CM

## 2022-10-05 DIAGNOSIS — F1111 Opioid abuse, in remission: Secondary | ICD-10-CM

## 2022-10-05 DIAGNOSIS — F1021 Alcohol dependence, in remission: Secondary | ICD-10-CM | POA: Diagnosis not present

## 2022-10-05 NOTE — Progress Notes (Unsigned)
THERAPIST PROGRESS NOTE  Session Time: 3:05 p.m. to 3:50 p.m.   Type of Therapy: Individual   Therapist Response/Interventions: CBT/The therapist completes Vernon Fowler's Treatment Plan noting that he has made no progress with his excessive caffeine consumption so gives Vernon Fowler the homework of getting a baseline on his coffee drinking and then to decrease it ten percent per week. He also recommends that Vernon Fowler schedule the three days per week and times that he will get on his elliptical for 20 minutes a a time.   He addresses many of Vernon Fowler's numerous late cancels in the past explaining that if he has additional late cancels or no shows that he can be discharged from the practice and referred to a different provider.   Treatment Goals addressed:  Active     Substance Use and GAD     Vernon Fowler will abstain from alcohol and drugs for the next sixth months based on self-report and/or UDS or breathalyzer.     Start:  10/06/22    Expected End:  04/05/23         Vernon Fowler will report a decrease in his anxiety as evidenced by his GAD-7 being a 4 or less while cutting his caffeine consumption from a pot per day to at least a half pot per day will consistently exercising three days per week, 20 minutes per session.      Start:  10/06/22    Expected End:  04/05/23             Summary: Vernon Fowler returns having not seen this therapist since November of last year. He says that he still has the same sobriety date which is in September of last year. He presents asking if there is any coffee in the break room making the therapist suspect that Vernon Fowler has not reduced his caffeine consumption.   He says that his girlfriend is now his fiance as he proposed to her on Christmas day. He says that his fiance complains that he will not sit still. He says that he is exercising more taking the dogs on a walk but his caffeine consumption is "still high" and he drinks about a pot of coffee per day.  He says that the adjustments that Dr.  Nelida Fowler made concerning his medications seemed to have helped such as stopping his Wellbutrin. He also was put on Propanolol three times per day.   Vernon Fowler is walking his dogs; however he is not exercising as was his goal previously and still his goal now.   He says that he is no longer seeing his previous therapist as he can address both his mental health and substance use issues with this therapist with the other therapist not able to address his addiction issues.   Progress Towards Goals: Initial  Suicidal/Homicidal: No SI or HI reported  Plan: Vernon Fowler returns again in 2 weeks.  Diagnosis: Alcohol Use Disorder, Severe, in early remission; Opioid Use Disorder, Mild, in early remission;  and GAD  Collaboration of Care: Other N/A  Patient/Guardian was advised Release of Information must be obtained prior to any record release in order to collaborate their care with an outside provider. Patient/Guardian was advised if they have not already done so to contact the registration department to sign all necessary forms in order for [place] to release information regarding their care.   Consent: Patient/Guardian gives verbal consent for treatment and assignment of benefits for services provided during this visit. Patient/Guardian expressed understanding and agreed to proceed.   [name], MA,  LCSW, [facility], [place] 10/05/2022

## 2022-10-06 ENCOUNTER — Encounter (HOSPITAL_COMMUNITY): Payer: Self-pay

## 2022-10-13 ENCOUNTER — Other Ambulatory Visit (HOSPITAL_COMMUNITY): Payer: Self-pay | Admitting: Psychiatry

## 2022-10-13 DIAGNOSIS — F411 Generalized anxiety disorder: Secondary | ICD-10-CM

## 2022-10-19 ENCOUNTER — Ambulatory Visit (HOSPITAL_COMMUNITY): Payer: Medicare Other | Admitting: Licensed Clinical Social Worker

## 2022-10-19 DIAGNOSIS — F411 Generalized anxiety disorder: Secondary | ICD-10-CM

## 2022-10-19 DIAGNOSIS — F1020 Alcohol dependence, uncomplicated: Secondary | ICD-10-CM

## 2022-10-19 NOTE — Progress Notes (Signed)
THERAPIST PROGRESS NOTE  Session Time: 3:10 p.m. to 4:10 p.m.  Type of Therapy: Individual   Therapist Response/Interventions: CBT/The therapist assists Vernon Fowler in identifying the trigger of his relapse and suggests that Vernon Fowler will have to learn to sit with grief, anxiety, etcetera while not using his "off switch" which is alcohol and to reach out to others instead which Vernon Fowler says is not is strong suit.  The therapist informs him that this is likely the most critical component of recovery.   Treatment Goals addressed:  Active     Substance Use and GAD     Vernon Fowler will abstain from alcohol and drugs for the next sixth months based on self-report and/or UDS or breathalyzer. (Not Progressing)     Start:  10/06/22    Expected End:  04/05/23         Vernon Fowler will report a decrease in his anxiety as evidenced by his GAD-7 being a 4 or less while cutting his caffeine consumption from a pot per day to at least a half pot per day will consistently exercising three days per week, 20 minutes per session.  (Not Applicable)     Start:  10/06/22    Expected End:  04/05/23    Resolved:  10/19/22            Summary: Vernon Fowler presents today late admitting that he relapsed last Monday and has drunk about a half-pint per day and a couple of airplane bottles up until about 1 a.m this morning noting that today is is sobriety date.  He is initially unable to identify a trigger for relapsing. He says that his fiance has suggested that if he loved her that he would not drink. He also says that his fiance has had another episode of jealousy concerning the male with whom he was in group as she sent Vernon Fowler a text inviting both Vernon Fowler and his fiance to see her pick up her one year chip. The therapist explores this relationship more learning that Vernon Fowler proposed to her even though he previously said that he would never get married again. During the session, he admits that it is likely that he gave her the ring to show her  that he would not leave her as everyone else has saying that his sister has told him that all the woman with whom he has relationships are "wounded animals" that his is trying to rescue. He also note that they have not had any sexual intimacy in a year blaming it on the rescue dogs that sleep in their bed.  Vernon Fowler's affect is tearful throughout much of the session and he finally realizes that the trigger for his drinking was having to put his dog down the day before. He told his Sponsor that he was thinking of drinking but ended up relapsing admitting that he was drinking to escape the pain of grief.  At the conclusion of the session, he says that he would like to have his fiance attend his next session.   Progress Towards Goals: Not progressing  Suicidal/Homicidal: No SI or HI reported  Plan: Vernon Fowler returns again in 1 weeks.  Diagnosis: Alcohol Use Disorder, Severe, in early remission; Opioid Use Disorder, Mild, in early remission;  and GAD  Collaboration of Care: Other N/A  Patient/Guardian was advised Release of Information must be obtained prior to any record release in order to collaborate their care with an outside provider. Patient/Guardian was advised if they have not already done so to contact the  registration department to sign all necessary forms in order for [place] to release information regarding their care.   Consent: Patient/Guardian gives verbal consent for treatment and assignment of benefits for services provided during this visit. Patient/Guardian expressed understanding and agreed to proceed.   [name], [place], LCSW, [facility], [place] 10/19/2022

## 2022-10-24 DIAGNOSIS — G894 Chronic pain syndrome: Secondary | ICD-10-CM | POA: Insufficient documentation

## 2022-11-02 ENCOUNTER — Ambulatory Visit (INDEPENDENT_AMBULATORY_CARE_PROVIDER_SITE_OTHER): Payer: Medicare Other | Admitting: Licensed Clinical Social Worker

## 2022-11-02 DIAGNOSIS — F1020 Alcohol dependence, uncomplicated: Secondary | ICD-10-CM

## 2022-11-02 DIAGNOSIS — F411 Generalized anxiety disorder: Secondary | ICD-10-CM

## 2022-11-02 NOTE — Progress Notes (Signed)
THERAPIST PROGRESS NOTE  Session Time: 1 p.m. to 2:10 p.m.   Type of Therapy: Family Therapy  Therapist Response/Interventions: CBT/The therapist addresses Vernon Fowler's going over regrets concerning his past actions possibly having negatively impacted his daughter's lives and catastrophizing about what will happen in their future suggesting that he needs to recognize that they are adults now and that he is powerless to control what does or does not happen to them while needing to focus on his own recovery instead.   Treatment Goals addressed:  Substance Use Disorder         Problem: Substance Use and GAD     Dates: Start:  10/06/22       Disciplines: Interdisciplinary, PROVIDER        Goal: Vernon Fowler will abstain from alcohol and drugs for the next sixth months based on self-report and/or UDS or breathalyzer.     Dates: Start:  10/06/22    Expected End:  04/05/23       Disciplines: Interdisciplinary, PROVIDER             Goal: Vernon Fowler will report a decrease in his anxiety as evidenced by his GAD-7 being a 4 or less while cutting his caffeine consumption from a pot per day to at least a half pot per day will consistently exercising three days per week, 20 minutes per session.      Dates: Start:  10/06/22    Expected End:  04/05/23       Disciplines: Interdisciplinary, PROVIDER             Intervention: Therapist will assist Vernon Fowler in identifying and avoiding triggers for use and changing thoughts and behaviors that contribute to his anxiety              Summary: Vernon Fowler presents accompanied by his fiance with the major focus of the session being Vernon Fowler's pattern of relapse. The therapist observes that Vernon Fowler is drinking coffee and has apparently not followed up on cutting back on this or exercising. His fiance notes that Vernon Fowler dwells on the past and worries about the future with the focus being on his children. His fiance says that if Vernon Fowler has an unpleasant emotion that he is impatient for it to  go away quickly thus leading to drinking. She notes that she has over four years of sobriety and that her children do not even talk to her; however, she does not drink over this. Vernon Fowler recognizes that he needs to start calling other people in the program with the therapist noting that Vernon Fowler needs to not only call but to talk about what is going on in his life and what he is feeling as he seems to disconnect from unpleasant emotions which eventually lead to a relapse.   His fiance suggests that Vernon Fowler was to restart CD IOP with Vernon Fowler saying that he wishes to discuss this further with this therapist at his next session.   Progress Towards Goals: Not progressing  Suicidal/Homicidal: No SI or HI reported  Plan: Vernon Fowler says that he will call to reschedule with this therapist and will consider whether he needs to return to CD IOP as his partner seems to suggest that this was possibly going to be the plan.  Diagnosis: Alcohol Use Disorder, Severe, in early remission; Opioid Use Disorder, Mild, in early remission;  and GAD  Collaboration of Care: Other N/A  Patient/Guardian was advised Release of Information must be obtained prior to any record release in order to collaborate their care  with an outside provider. Patient/Guardian was advised if they have not already done so to contact the registration department to sign all necessary forms in order for [place] to release information regarding their care.   Consent: Patient/Guardian gives verbal consent for treatment and assignment of benefits for services provided during this visit. Patient/Guardian expressed understanding and agreed to proceed.   [name], [place], LCSW, [facility], [place] 11/02/2023

## 2022-11-03 ENCOUNTER — Ambulatory Visit (HOSPITAL_COMMUNITY): Payer: Medicare Other | Admitting: Psychiatry

## 2022-11-13 ENCOUNTER — Other Ambulatory Visit (HOSPITAL_COMMUNITY): Payer: Self-pay | Admitting: Psychiatry

## 2022-11-13 DIAGNOSIS — F411 Generalized anxiety disorder: Secondary | ICD-10-CM

## 2022-11-16 ENCOUNTER — Encounter (HOSPITAL_COMMUNITY): Payer: Self-pay | Admitting: Psychiatry

## 2022-11-16 ENCOUNTER — Encounter (HOSPITAL_COMMUNITY): Payer: Medicare Other | Admitting: Psychiatry

## 2022-11-16 ENCOUNTER — Telehealth (HOSPITAL_BASED_OUTPATIENT_CLINIC_OR_DEPARTMENT_OTHER): Payer: Medicare Other | Admitting: Psychiatry

## 2022-11-16 DIAGNOSIS — F1020 Alcohol dependence, uncomplicated: Secondary | ICD-10-CM | POA: Diagnosis not present

## 2022-11-16 DIAGNOSIS — F411 Generalized anxiety disorder: Secondary | ICD-10-CM

## 2022-11-16 MED ORDER — SERTRALINE HCL 100 MG PO TABS: 200.0000 mg | ORAL_TABLET | Freq: Every day | ORAL | 1 refills | Status: AC

## 2022-11-16 NOTE — Progress Notes (Signed)
BH MD/PA/NP OP Progress Note  11/16/2022 4:28 PM Vernon Fowler  MRN:  [id number]  Visit Diagnosis:    ICD-10-CM   1. Alcohol use disorder, severe, dependence ([place])  F10.20     2. GAD (generalized anxiety disorder)  F41.1       Assessment: Vernon Fowler is a 67 y.o. y.o. male with a history of severe alcohol use disorder in early remission and GAD who presented to [facility] for initial evaluation on 07/05/2022.    Patient reports symptoms of anxiety including being nervous or on edge, being unable to stop or control his worrying, difficulty relaxing, worrying too much about different things, becoming easily annoyed or irritable, and increased restlessness.  Of note patient can have increased symptoms in social situations.  Patient has a significant history of severe alcohol use disorder and is in early remission as of 05-10-2022.  On interview he was noticed to have some increased difficulties with immediate and recent recall.  Patient denies depressed mood, SI/HI, history of mania, or history of psychosis.  Psychosocially patient has a history of trauma after witnessing the death of a friend in a car accident.  He has good social supports in his family and is financially stable. Patient meets criteria for severe alcohol use disorder in early remission and generalized anxiety disorder.  He would benefit from medication adjustment, continuing with therapy, continuing with AA, and engaging in behavioral activation techniques such as increased exercise.  Vernon Fowler presents for follow-up evaluation. Today, 11/16/22, patient reports continued difficulties with extreme anxiety most notably the physical symptoms of restlessness and heart palpitations.  Patient reports minimal improvement from medication adjustment as best.  As for side effects he is unsure but potentially propranolol has resulted in some muscle weakness/lightheadedness.  Side effect could also be  related to his statin medication which was discontinued yesterday.  Patient will try and monitor if the episodes of weakness/lightheadedness occur shortly after he takes his propranolol.  Patient did relapse on alcohol in February secondary to the loss of his dog.  He has maintained his sobriety since discharging from detox.  Smoking cessation was also discussed though starting additional medications was held off on today with the plan to reassess at his next appointment.  Plan: - Increase Zoloft to 200 mg QD - Continue Trazodone 100 - Continue Pregablin 150 mg TID - Continue Baclofen 10 mg TID  - Start Propranolol 10 mg tid prn for anxiety - Consider naltrexone and vivitrol injection if insurance covers - Discontinue Wellbutrin XL 150 mg - Discontinue BuSpar - Utox as needed - Follows with [facility] biweekly for therapy - Crisis resources reviewed - Follow up in a month  Chief Complaint:  Chief Complaint  Patient presents with   Follow-up   HPI: Vernon Fowler presents reporting that he is still struggling with extreme anxiety.  He notes that his anxiety is off the charts and he does not have any idea why.  He thinks things in his life are going well and there does not seem to be any reason for why he is having anxiety.  As far as medication adjustments he is sticking with the regimen that we had discussed back in November however he is unsure if there is been any benefit.  At most he thinks there may be some minor that benefit but it is hard to tell.  Patient describes physically feeling heart palpitations, restlessness, and overall discomfort.  He does use the propranolol as needed  1-2 times a day and finds it breaks down his palpitations.  We discussed the possibility of potentially increasing this the patient expressed some concern based off what the doctor told him and felt to call saying that his pulse was too low for that.  We did discuss the effects of this medication on heart rate and  explored for side effects.  Patient identified experiencing muscle weakness sometimes when standing up though noticed that it could have been secondary to the statin and which he discontinued yesterday.  At this time he opted to continue to take the propranolol on an as needed basis as opposed to scheduled.  Patient was open to titrating the Zoloft up to 200 mg daily however.  Risk and benefits of this increase were discussed.  Of note patient relapsed on alcohol in February after he had to put down his dog and notes that he was drinking 2-4 bottles of wine and 1 pint of liquor a day.  Patient's last drink was around 2/24-2/25.  Since being admitted for detox and discharge she reports maintaining sobriety and attending AA.  He also continues with Vernon Fowler for therapy.  Patient has has been approved for the IOP program and is considering restarting that as well.  Patient notes that he also smoking cigarettes and drinking caffeine which could be contributing to his current anxiety symptoms.  He is interested in quitting smoking though notes is hard to do without medication.  We did discuss options however he wanted to hold off for now until we meet in person to discuss it further.  His mother Being he spoke with Vernon Fowler about a taper off which she is still working on getting going consistently.  Past Psychiatric History: Patient has followed with Dr. Toy Care in the past.  He denies any prior suicide attempts or episodes of self-harm.  He denies any inpatient psychiatric hospitalizations but notes having been hospitalized for detox and long-term substance use treatment on multiple occasions.  He has been to substance use programs at [facility] in [place] 3 times, in [place] once, in [place] twice, and to a program in [place] most recently.  Patient has been prescribed Klonopin, Ativan, BuSpar, Atarax, Zoloft, bupropion, trazodone, naltrexone, gabapentin, propranolol, pregabalin, and baclofen in  the past.  He reports little benefit from the BuSpar and the Atarax.  While the Klonopin and the Ativan had been helpful he notes that they were problematic with his alcohol use and does not want to try them again.  Patient has a significant history of past substance use including marijuana and alcohol use started around the age of 72.  The marijuana use stopped around college with alcohol use continued in a primarily social pattern.  Patient would report drinking more heavily on Thursday through Sunday during college.  After college patient started to use cocaine in a similar fashion.  His alcohol use became more focused around social work situations and meetings.  It was not until he married his second wife that the alcohol use started to become daily.  Patient's alcohol use eventually reached a degree where he lost his job.  Since then he has been in and out of detox programs with the longest period of 18 months of sobriety.  He currently is coming up on 2 months of sobriety with 05-10-2022 being the date of his last drink.  He denies any marijuana or cocaine use in an extended period.  Past Medical History:  Past Medical History:  Diagnosis Date  Alcohol abuse    Anxiety    Cancer ([place])    GERD (gastroesophageal reflux disease)    Hyperlipidemia    Hypertension    Sleep apnea    w/CPAP   Thyroid disease     Past Surgical History:  Procedure Laterality Date   COLONOSCOPY     COLONOSCOPY  04/2016   [place] polyps   FASCIOTOMY Right 10/2019   LAPAROTOMY N/A 12/13/2020   Procedure: EXPLORATORY LAPAROTOMY, LYSIS OF ADHESIONS;  Surgeon: Leighton Ruff, MD;  Location: [facility];  Service: General;  Laterality: N/A;   SHOULDER ARTHROSCOPY WITH ROTATOR CUFF REPAIR Right 03/01/2019   Procedure: Right shoulder arthroscopy, subacromial decompression, distal clavicle resection, rotator cuff repair;  Surgeon: Justice Britain, MD;  Location: [facility];  Service: Orthopedics;  Laterality: Right;    THYROIDECTOMY     1987   UPPER GASTROINTESTINAL ENDOSCOPY     Family History:  Family History  Problem Relation Age of Onset   Cancer Mother    Anxiety disorder Mother    Diabetes Father    Hyperlipidemia Father    Hypertension Father    Cancer Maternal Grandmother    Cancer Paternal Grandmother    Heart disease Paternal Grandfather    Anxiety disorder Daughter    Colon cancer Neg Hx    Esophageal cancer Neg Hx    Stomach cancer Neg Hx    Rectal cancer Neg Hx    Colon polyps Neg Hx     Social History:  Social History   Socioeconomic History   Marital status: Single    Spouse name: Not on file   Number of children: Not on file   Years of education: Not on file   Highest education level: Not on file  Occupational History   Not on file  Tobacco Use   Smoking status: Former    Packs/day: 0.25    Years: 4.00    Additional pack years: 0.00    Total pack years: 1.00    Types: Cigarettes    Quit date: 11/17/2018    Years since quitting: 4.0   Smokeless tobacco: Never   Tobacco comments:    4 per day  Vaping Use   Vaping Use: Never used  Substance and Sexual Activity   Alcohol use: Not Currently    Comment: recovery ETOH - last drink 10/04/21   Drug use: Never   Sexual activity: Yes  Other Topics Concern   Not on file  Social History Narrative   Works at [employer] to [employer] recently - currently separated   2 daughters - [place] and [place]   Social Determinants of Health   Financial Resource Strain: Not on file  Food Insecurity: Not on file  Transportation Needs: Not on file  Physical Activity: Not on file  Stress: Not on file  Social Connections: Not on file    Allergies:  Allergies  Allergen Reactions   Sulfa Antibiotics Rash    Fixed based skin reaction    Current Medications: Current Outpatient Medications  Medication Sig Dispense Refill   b complex vitamins tablet Take 1 tablet by mouth daily.     baclofen (LIORESAL) 10 MG  tablet Take 1 tablet (10 mg total) by mouth 3 (three) times daily. 90 tablet 2   carboxymethylcellulose (REFRESH PLUS) 0.5 % SOLN Place 1 drop into both eyes 2 (two) times daily as needed (dry eyes).      Coenzyme Q10 (COQ10) 100  MG CAPS Take 100 mg by mouth daily.      gabapentin (NEURONTIN) 600 MG tablet Take 1,200 mg by mouth 2 (two) times daily.     hydrOXYzine (VISTARIL) 25 MG capsule Take 25 mg by mouth 3 (three) times daily.     irbesartan (AVAPRO) 150 MG tablet Take 150 mg by mouth daily.     levothyroxine (SYNTHROID) 175 MCG tablet Take by mouth.     Multiple Vitamin (MULTIVITAMIN WITH MINERALS) TABS tablet Take 1 tablet by mouth daily. Men's 50+     Omega-3 Fatty Acids (FISH OIL) 1000 MG CAPS Take 1,000 mg by mouth daily.     omeprazole (PRILOSEC) 40 MG capsule TAKE ONE CAPSULE BY MOUTH TWICE A DAY 180 capsule 3   pregabalin (LYRICA) 150 MG capsule Take 1 capsule (150 mg total) by mouth 3 (three) times daily. 90 capsule 2   propranolol (INDERAL) 10 MG tablet TAKE ONE TABLET BY MOUTH THREE TIMES A DAY 90 tablet 0   rosuvastatin (CRESTOR) 10 MG tablet Take 10 mg by mouth at bedtime.     sertraline (ZOLOFT) 100 MG tablet Take 1.5 tablets (150 mg total) by mouth daily. 45 tablet 2   traZODone (DESYREL) 100 MG tablet Take 100 mg by mouth at bedtime.     No current facility-administered medications for this visit.     Psychiatric Specialty Exam: Review of Systems  There were no vitals taken for this visit.There is no height or weight on file to calculate BMI.  General Appearance: Disheveled and Fairly Groomed  Eye Contact:  Fair  Speech:  Normal Rate  Volume:  Normal  Mood:  Anxious  Affect:  Congruent  Thought Process:  Coherent and Goal Directed  Orientation:  Full (Time, Place, and Person)  Thought Content: Rumination   Suicidal Thoughts:  No  Homicidal Thoughts:  No  Memory:  NA  Judgement:  Fair  Insight:  Fair  Psychomotor Activity:  Restlessness  Concentration:   Concentration: Fair  Recall:  Good  Fund of Knowledge: Fair  Language: Good  Akathisia:  NA    AIMS (if indicated): not done  Assets:  Communication Skills Desire for Improvement Financial Resources/Insurance Housing Social Support Talents/Skills Transportation Vocational/Educational  ADL's:  Intact  Cognition: WNL  Sleep:  Fair   Metabolic Disorder Labs: No results found for: "HGBA1C", "MPG" No results found for: "PROLACTIN" No results found for: "CHOL", "TRIG", "HDL", "CHOLHDL", "VLDL", "LDLCALC" No results found for: "TSH"  Therapeutic Level Labs: No results found for: "LITHIUM" No results found for: "VALPROATE" No results found for: "CBMZ"   Screenings: AUDIT    Flowsheet Row Counselor from 12/09/2017 in [place] at [facility]  Alcohol Use Disorder Identification Test Final Score (AUDIT) 28      GAD-7    Flowsheet Row Counselor from 10/05/2022 in [place] at [place] from 11/04/2021 in [place] at [place] from 12/09/2017 in [place] at [facility]  Total GAD-7 Score 5 5 4       [place] from 10/05/2022 in [place] [facility] [place] from 11/04/2021 in [place] at [place] from 12/09/2017 in [place] at [place] from 10/28/2017 in [facility] at [facility] Total Score 0 0 3 0  PHQ-9 Total Score 4 -- 6 --      Flowsheet Row  Counselor from 11/04/2021 in [place] at [facility] ED to Hosp-Admission (Discharged) from 12/13/2020 in [facility]  C-SSRS RISK CATEGORY No Risk No Risk       Collaboration of Care: Collaboration of Care: Medication Management AEB medication prescription, Other provider involved in patient's care AEB  detox chart review, and Referral or follow-up with counselor/therapist AEB chart review  Patient/Guardian was advised Release of Information must be obtained prior to any record release in order to collaborate their care with an outside provider. Patient/Guardian was advised if they have not already done so to contact the registration department to sign all necessary forms in order for [place] to release information regarding their care.   Consent: Patient/Guardian gives verbal consent for treatment and assignment of benefits for services provided during this visit. Patient/Guardian expressed understanding and agreed to proceed.    Vista Mink, MD 11/16/2022, 4:28 PM   Virtual Visit via Video Note  I connected with Vernon Fowler on 11/16/22 at  4:30 PM EDT by a video enabled telemedicine application and verified that I am speaking with the correct person using two identifiers.  Location: Patient: Home Provider: Home Office   I discussed the limitations of evaluation and management by telemedicine and the availability of in person appointments. The patient expressed understanding and agreed to proceed.   I discussed the assessment and treatment plan with the patient. The patient was provided an opportunity to ask questions and all were answered. The patient agreed with the plan and demonstrated an understanding of the instructions.   The patient was advised to call back or seek an in-person evaluation if the symptoms worsen or if the condition fails to improve as anticipated.  I provided 30 minutes of non-face-to-face time during this encounter.   Vista Mink, MD

## 2022-11-16 NOTE — Progress Notes (Signed)
This encounter was created in error - please disregard.

## 2022-11-18 ENCOUNTER — Ambulatory Visit (INDEPENDENT_AMBULATORY_CARE_PROVIDER_SITE_OTHER): Payer: Medicare Other | Admitting: Licensed Clinical Social Worker

## 2022-11-18 DIAGNOSIS — F1020 Alcohol dependence, uncomplicated: Secondary | ICD-10-CM

## 2022-11-18 DIAGNOSIS — F411 Generalized anxiety disorder: Secondary | ICD-10-CM

## 2022-11-18 NOTE — Progress Notes (Signed)
THERAPIST PROGRESS NOTE  Session Time: 11:05 a.m. to 11:45 a.m.   Type of Therapy: Individual Therapy  Therapist Response/Interventions: CBT/The therapist addresses some of [name]'s unhelpful beliefs about his daughter being lonely and his having not grieved properly helping him to see that he has no data to support these beliefs.  He reinforces [name]'s change in caffeine consumption and exercise encouraging [name] to avoid all or nothing thinking and stressing that doing these exercises three time per week does make a measurable impact.   Treatment Goals addressed:  Substance Use Disorder         Problem: Substance Use and GAD     Dates: Start:  10/06/22       Disciplines: Interdisciplinary, PROVIDER        Goal: [name] will abstain from alcohol and drugs for the next sixth months based on self-report and/or UDS or breathalyzer.     Dates: Start:  10/06/22    Expected End:  04/05/23       Disciplines: Interdisciplinary, PROVIDER             Goal: [name] will report a decrease in his anxiety as evidenced by his GAD-7 being a 4 or less while cutting his caffeine consumption from a pot per day to at least a half pot per day will consistently exercising three days per week, 20 minutes per session.      Dates: Start:  10/06/22    Expected End:  04/05/23       Disciplines: Interdisciplinary, PROVIDER             Intervention: Therapist will assist [name] in identifying and avoiding triggers for use and changing thoughts and behaviors that contribute to his anxiety              Summary: [name] presents saying that the session with his fiance went extremely well. He has cut back on his coffee consumption having a set amount and saying that he can tell a difference in it reducing his anxiety and picking at his nails. He does still sometimes drink half a mug of caffeinated coffee at evening AA meetings with the therapist suggesting that he go 100% decaf.   [name] has gotten on his elliptical  for twenty minutes three times a week. 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 Thus, he views this low level of exercise as possibly really doing nothing.   He says that he is now calling people and checking in and has not drunk alcohol. The therapist talks about [name]'s concerns about his children with [place] admitting that he worries about his daughter in [place] being lonely apparently as [name] had issues with loneliness when living alone earlier in his history.   Towards the end of the session, the therapist explores with [name] his belief that he may not have grieved correctly; however, at  session's end, he concludes that this is apparently not the case. He has to leave 15 minutes early due to work being done on his house.   At session's end, he recognizes that he has in actuality grieved as he should and that his daughter is fine.   Progress Towards Goals: Progressing  Suicidal/Homicidal: No SI or HI reported  Plan: [name] says that he will call to reschedule with this therapist and will consider whether he needs to return to [facility] as his partner seems to suggest that this was possibly going  to be the plan.  Diagnosis: Alcohol Use Disorder, Severe, in early remission; Opioid Use Disorder, Mild, in early remission;  and GAD  Collaboration of Care: Other N/A  Patient/Guardian was advised Release of Information must be obtained prior to any record release in order to collaborate their care with an outside provider. Patient/Guardian was advised if they have not already done so to contact the registration department to sign all necessary forms in order for [place] to release information regarding their care.   Consent: Patient/Guardian gives verbal consent for treatment and assignment of benefits for services provided during this visit. Patient/Guardian expressed understanding and agreed to proceed.   [name], [place], LCSW,  [facility], [place] 11/18/2022

## 2022-12-01 ENCOUNTER — Ambulatory Visit (HOSPITAL_COMMUNITY): Payer: Medicare Other | Admitting: Psychiatry

## 2022-12-02 ENCOUNTER — Ambulatory Visit (HOSPITAL_COMMUNITY): Payer: Medicare Other | Admitting: Licensed Clinical Social Worker

## 2022-12-14 ENCOUNTER — Other Ambulatory Visit (HOSPITAL_COMMUNITY): Payer: Self-pay | Admitting: Medical

## 2022-12-14 ENCOUNTER — Telehealth (HOSPITAL_COMMUNITY): Payer: Self-pay | Admitting: Licensed Clinical Social Worker

## 2022-12-14 DIAGNOSIS — F411 Generalized anxiety disorder: Secondary | ICD-10-CM

## 2022-12-14 NOTE — Telephone Encounter (Signed)
The therapist returns [name]'s calls leaving a HIPAA-compliant voicemail that he has him on the schedule for 12/23/22 at 11 a.m. as requested.  [name], MA, LCSW, [facility], LCAS 12/14/2022

## 2022-12-16 ENCOUNTER — Encounter (HOSPITAL_COMMUNITY): Payer: Self-pay | Admitting: Medical

## 2022-12-16 ENCOUNTER — Ambulatory Visit (HOSPITAL_BASED_OUTPATIENT_CLINIC_OR_DEPARTMENT_OTHER): Payer: Medicare Other | Admitting: Medical

## 2022-12-16 ENCOUNTER — Telehealth (HOSPITAL_COMMUNITY): Payer: Medicare Other | Admitting: Medical

## 2022-12-16 DIAGNOSIS — F1020 Alcohol dependence, uncomplicated: Secondary | ICD-10-CM

## 2022-12-16 DIAGNOSIS — F419 Anxiety disorder, unspecified: Secondary | ICD-10-CM | POA: Diagnosis not present

## 2022-12-16 DIAGNOSIS — E890 Postprocedural hypothyroidism: Secondary | ICD-10-CM

## 2022-12-16 DIAGNOSIS — E785 Hyperlipidemia, unspecified: Secondary | ICD-10-CM

## 2022-12-16 DIAGNOSIS — F341 Dysthymic disorder: Secondary | ICD-10-CM | POA: Diagnosis not present

## 2022-12-16 DIAGNOSIS — Z8659 Personal history of other mental and behavioral disorders: Secondary | ICD-10-CM | POA: Diagnosis not present

## 2022-12-16 DIAGNOSIS — T7402XS Child neglect or abandonment, confirmed, sequela: Secondary | ICD-10-CM

## 2022-12-16 DIAGNOSIS — Z79899 Other long term (current) drug therapy: Secondary | ICD-10-CM

## 2022-12-16 DIAGNOSIS — F1111 Opioid abuse, in remission: Secondary | ICD-10-CM

## 2022-12-16 DIAGNOSIS — Z6372 Alcoholism and drug addiction in family: Secondary | ICD-10-CM

## 2022-12-16 DIAGNOSIS — I10 Essential (primary) hypertension: Secondary | ICD-10-CM

## 2022-12-16 MED ORDER — BACLOFEN 10 MG PO TABS: 10.0000 mg | ORAL_TABLET | Freq: Three times a day (TID) | ORAL | 0 refills | Status: AC
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
## 2022-12-21 ENCOUNTER — Telehealth (HOSPITAL_COMMUNITY): Payer: Self-pay | Admitting: Licensed Clinical Social Worker

## 2022-12-21 NOTE — Telephone Encounter (Signed)
The therapist receives a message from Vernon Fowler saying that he has relapsed again so will be going to a seven day program at [facility] in [place] so will not be attending his appointment with this therapist on 12/23/22. He says that he would like to reschedule for the following week if possible.  The therapist leaves a HIPAA-compliant voicemail informing Vernon Fowler that this therapist has availability on 12/30/22; however, he requests that he call the [phone] and speak with the Receptionist to get scheduled so he can pick the time that best works for him.   Vernon Blazer, MA, LCSW, [facility], LCAS 12/21/2022

## 2022-12-23 ENCOUNTER — Ambulatory Visit (HOSPITAL_COMMUNITY): Payer: Medicare Other | Admitting: Licensed Clinical Social Worker

## 2022-12-23 NOTE — Telephone Encounter (Signed)
Seen in office last week.

## 2022-12-27 ENCOUNTER — Telehealth (HOSPITAL_COMMUNITY): Payer: Self-pay | Admitting: Licensed Clinical Social Worker

## 2022-12-27 NOTE — Telephone Encounter (Signed)
The therapist receives voicemails from [place] and [place] with [facility] about scheduling him to stepdown to CD IOP. The therapist finally speaks with Boneta Lucks scheduling [name] to resume CD IOP as of 12/29/22. [name] is to be discharged today.  [address] MA, LCSW, [facility], LCAS 12/27/2022

## 2022-12-29 ENCOUNTER — Ambulatory Visit (INDEPENDENT_AMBULATORY_CARE_PROVIDER_SITE_OTHER): Payer: Medicare Other | Admitting: Licensed Clinical Social Worker

## 2022-12-29 ENCOUNTER — Encounter (HOSPITAL_COMMUNITY): Payer: Self-pay | Admitting: Medical

## 2022-12-29 VITALS — BP 112/68 | HR 58 | Ht 74.0 in | Wt 220.0 lb

## 2022-12-29 DIAGNOSIS — F1020 Alcohol dependence, uncomplicated: Secondary | ICD-10-CM

## 2022-12-29 DIAGNOSIS — F1111 Opioid abuse, in remission: Secondary | ICD-10-CM

## 2022-12-29 DIAGNOSIS — F419 Anxiety disorder, unspecified: Secondary | ICD-10-CM

## 2022-12-29 DIAGNOSIS — F341 Dysthymic disorder: Secondary | ICD-10-CM

## 2022-12-29 DIAGNOSIS — T7402XS Child neglect or abandonment, confirmed, sequela: Secondary | ICD-10-CM

## 2022-12-29 DIAGNOSIS — E785 Hyperlipidemia, unspecified: Secondary | ICD-10-CM

## 2022-12-29 DIAGNOSIS — G894 Chronic pain syndrome: Secondary | ICD-10-CM

## 2022-12-29 DIAGNOSIS — F1110 Opioid abuse, uncomplicated: Secondary | ICD-10-CM | POA: Diagnosis not present

## 2022-12-29 DIAGNOSIS — Z6372 Alcoholism and drug addiction in family: Secondary | ICD-10-CM | POA: Diagnosis not present

## 2022-12-29 DIAGNOSIS — Z8659 Personal history of other mental and behavioral disorders: Secondary | ICD-10-CM

## 2022-12-29 DIAGNOSIS — E890 Postprocedural hypothyroidism: Secondary | ICD-10-CM

## 2022-12-29 DIAGNOSIS — Z659 Problem related to unspecified psychosocial circumstances: Secondary | ICD-10-CM | POA: Diagnosis not present

## 2022-12-29 DIAGNOSIS — I10 Essential (primary) hypertension: Secondary | ICD-10-CM
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
## 2022-12-29 NOTE — Progress Notes (Signed)
Daily Group Progress Note   Program: CD IOP     Individual Time: 9 a.m. to 12 p.m.   Type of Therapy: Process and Psychoeducational    Topic: The therapist checks in with group members, assesses for SI/HI/psychosis and overall level of functioning. The therapist inquires about sobriety date and number of community support meetings attended since last session.    The therapist introduces a new group member and answers questions on what is a Marketing executive, what the difference is between AA and NA, and what the Big Book is and how to get one. The therapist reiterates the information from the first module of the Matrix Model concerning the importance of scheduling. He educates group members on the different drinking patterns that persons with alcohol dependence can have. He discusses how engaging in incompatible activities can help one maintain sobriety and challenges group members to come up with coping techniques other than using substances to deal with emotional distress.   The therapist observes that it took about ten minutes for a group member to come up with the suggestion of reaching out to others with all activities identified before this being solitary activities such as meditating, journaling, etcetera. The therapist explains why reaching out to others and connecting is critical to one's being able to maintain recovery.    Summary: Vernon Fowler presents rating his depression as a "2" and his anxiety as an "8."    He is very active in today's discussions about how to choose a Sponsor and about reaching out to people. Vernon Fowler notes that his "main weakness" in his recovery has been "not calling people."  Vernon Fowler provides some history about AA to another member new to recovery and brings some [employer] magazines to group to give to other group members explaining what this publication is.   He says that he recently relapsed due to "March Madness" noting that he thought that he could have a drink while watching  sports which is a trigger for him. He says that the PA-C pointed out that a person's disease continues to progress even when the person is not using with Vernon Fowler noting that he was surprised at how sick he became so quickly. The therapist observes that Vernon Fowler is aware of watching sports as being a trigger and that if need be, he may have to refrain from doing so if it jeopardizes his sobriety.   Vernon Fowler explains to another group member who has issues with severe anxiety that he too has anxiety problems and would not let go of alcohol as she does not let go of her Klonepin as he viewed it as being a comfort or security blanket. At the same time, he says that being put on Klonepin by a psychiatrist caused his longest sobriety from alcohol to end and alcohol and benzodiazepine withdrawals are the only ones that can kill a person.    Progress Towards Goals: Vernon Fowler reports no alcohol use.    UDS collected: No Results: No   AA/NA attended?: Yes   Sponsor?: Yes   [name], MA, LCSW, [facility], LCAS 12/29/2022

## 2022-12-31 ENCOUNTER — Ambulatory Visit (INDEPENDENT_AMBULATORY_CARE_PROVIDER_SITE_OTHER): Payer: Medicare Other | Admitting: Licensed Clinical Social Worker

## 2022-12-31 DIAGNOSIS — F411 Generalized anxiety disorder: Secondary | ICD-10-CM

## 2022-12-31 DIAGNOSIS — F1020 Alcohol dependence, uncomplicated: Secondary | ICD-10-CM | POA: Diagnosis not present

## 2022-12-31 NOTE — Progress Notes (Signed)
Daily Group Progress Note   Program: [facility]     Individual Time: 9 a.m. to 12 p.m.   Type of Therapy: Process and Psychoeducational    Topic: The therapist checks in with group members, assesses for SI/HI/psychosis and overall level of functioning. The therapist inquires about sobriety date and number of community support meetings attended since last session.    The therapist facilitates discussions on co-dependency and how this relates to addiction and the differences between helping and enabling. The therapist talks about the role of "tough love" in relation to dealing with people with addiction.   The therapist discusses addiction as an inheritable, brain-based disease noting that everyone present in group today has family members with the disease of addiction which is not surprising.    Summary: Vernon Fowler presents rating his depression as a "2" and his anxiety as an "6."    He describes his mood as "anxious" and "stressed." He admits that he is "still shaky" and that he even had the "dry heaves" this morning though he has not drunk in 12 says. He says that he has a using dream on Wednesday that caused him to have a "huge craving." He reports having some short-term memory problems as does a couple of other group members.  Vernon Fowler says that when he relapsed recently that his "brain" told him that he could likely watch the game and have one drink. The therapist questions how it is that [name]'s disease continues to trick him into believing he can have one drink without it getting out of control as it always does. The therapist also discloses his belief that Vernon Fowler has not taken the First Step.  In talking about [name]'s relationship with his fiance, he says that she has told him that she is going to move out if he drinks again. The therapist observes that his fiance has told him this numerous times in the past yet never carried through on these threats. Vernon Fowler admits that he has told his fiance that if  she were to leave that he is "done" implying something terrible would happen to him. The therapist discloses his belief that Vernon Fowler is in a way holding her hostage with this implied threat especially considering that her ex-husband committed suicide over which she struggles with irrational guilt.  Additionally, the therapist observes that all of [name]'s family members who have threatened to no longer have contact with him always seem to come back after he gets a little bit of sobriety. The therapist informs Vernon Fowler that it may get to a point that they may actually no longer come back. Another male in group shares about how she kept taking her porn addicted ex back until one day she no longer did noting that he begged her to not leave but to no avail.   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    Progress Towards Goals: Vernon Fowler reports no alcohol use.    UDS collected: No Results: No   AA/NA attended?: Yes   Sponsor?: Yes   [name], MA, LCSW, [facility], LCAS 12/31/2022

## 2023-01-03 ENCOUNTER — Ambulatory Visit (INDEPENDENT_AMBULATORY_CARE_PROVIDER_SITE_OTHER): Payer: Medicare Other | Admitting: Licensed Clinical Social Worker

## 2023-01-03 DIAGNOSIS — F1020 Alcohol dependence, uncomplicated: Secondary | ICD-10-CM | POA: Diagnosis not present

## 2023-01-03 DIAGNOSIS — F411 Generalized anxiety disorder: Secondary | ICD-10-CM

## 2023-01-03 NOTE — Progress Notes (Signed)
Daily Group Progress Note   Program: [facility]     Individual Time: 9 a.m. to 12 p.m.   Type of Therapy: Process and Psychoeducational    Topic: The therapist checks in with group members, assesses for SI/HI/psychosis and overall level of functioning. The therapist inquires about sobriety date and number of community support meetings attended since last session.    The therapist educates group members on cross-tolerance between alcohol and benzodiazepines and explains what a benzodiazepine dose equivalency chart is and the differences between different benzodiazepines.   The therapist has group member watch and discuss the video, "Breaking the Addiction Cycle" having them complete the first two exercises from the video.     Summary: Vernon Fowler presents rating his depression as a "1" and his anxiety as an "6."    He describes his mood as "optimistic," "anxious," and "hopeful." He informs this therapist that he left a voicemail with him about setting up another conjoint meeting with his fiance.  Vernon Fowler says that he grew up in a family in which he saw adults enjoying alcohol socially. Vernon Fowler says that when he was in his early 35s that he told his mother that he had concerns that he was drinking too much while not telling her at the time that he was also using cocaine. [name]'s father suggested that Vernon Fowler just limit his drinking to "special occasions" with Vernon Fowler noting that he could make anything a special occasion.  The therapist focuses on [name]'s repeated statements that he did not drink alcoholically until 10 years ago. 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   Vernon Fowler says that he knows that he has said that it will be different this time but he says that he really believes that it will be different this time noting that in the past he failed to reach out  to others and call different people from the program.  The therapist discloses his belief that it is good for [name] to reach out to others but at the same time speculates that Vernon Fowler has a core belief or thinking error that needs to be addressed if he is to maintain long-term sobriety.    Progress Towards Goals: Vernon Fowler reports no alcohol use.    UDS collected: No Results: No   AA/NA attended?: Yes   Sponsor?: Yes   [name], MA, LCSW, [facility], LCAS 01/03/2023

## 2023-01-05 ENCOUNTER — Ambulatory Visit (INDEPENDENT_AMBULATORY_CARE_PROVIDER_SITE_OTHER): Payer: Medicare Other | Admitting: Licensed Clinical Social Worker

## 2023-01-05 DIAGNOSIS — F1020 Alcohol dependence, uncomplicated: Secondary | ICD-10-CM | POA: Diagnosis not present

## 2023-01-05 DIAGNOSIS — F411 Generalized anxiety disorder: Secondary | ICD-10-CM

## 2023-01-05 NOTE — Progress Notes (Signed)
Daily Group Progress Note   Program: [facility] IOP     Individual Time: 9 a.m. to 12 p.m.   Type of Therapy: Process and Psychoeducational    Topic: The therapist checks in with group members, assesses for SI/HI/psychosis and overall level of functioning. The therapist inquires about sobriety date and number of community support meetings attended since last session.    The therapist reviews the content and exercises covered in the previous group from the video, "Breaking the Addiction Cycle," as two members in attendance today missed group on Monday. The therapist focuses primarily on the topic of denial and the process by which persons actively in addition will "rebalance" the equation such that the positives of using seemingly outweigh the negatives. The therapist facilitates a discussion concerning how working the First Step is not just a mental process but that in doing so, behavioral change is implied. For example, one cannot say that he or she has accept powerlessness over drugs and alcohol while at the same time refusing to give up a stash, continuing to hang out with using friends, etcetera. The therapist explains the reason that group members are encouraged to start attending Twelve Step meetings and to get Sponsors while in [facility] as opposed to waiting until after discharge.   Summary: Vernon Fowler presents rating his depression as a "0" and his anxiety as an "6."    He describes his mood as "squirrelly" and "anxious." He tells a story about having been in a DWI class in the past in which the instructor suggested that everyone in there had a problem. Vernon Fowler indicates that he does not believe that everyone in a DWI class does not necessarily have an alcohol problem; however, after this therapist provides data, Vernon Fowler changes his view such that he recognizes that essentially everyone in DWI classes have alcohol problems.  Vernon Fowler attempts to function today as almost another group facilitator sharing  information he says that he has learned from his years in [place]. He suggests that another group member sounds to have taken the "First Step" based on a comment that she makes only for this group member to later say that she still believes she can be a social drinker.  The therapist suggests that Vernon Fowler has not taken the First Step and that he continues to rebalance the equation such that he can drink episodically while experiencing no major consequences. The therapist notes that Vernon Fowler's family and fiance threaten breaking off contact, etcetera; however, they never do. Vernon Fowler notes that his Sponsor suggests that Vernon Fowler's problem likely comes down to a lack of "desire" to stop with this therapist being in agreement with this assessment.  Vernon Fowler sounds surprised to learn that taking the First Step is not just a mental act of recognizing one is powerless over alcohol but that in doing so it requires behavioral change.    Progress Towards Goals: Vernon Fowler reports no alcohol use.    UDS collected: No Results: No   AA/NA attended?: Yes   Sponsor?: Yes   [name], MA, LCSW, [facility], LCAS 01/05/2023

## 2023-01-06 ENCOUNTER — Telehealth (HOSPITAL_COMMUNITY): Payer: Self-pay | Admitting: *Deleted

## 2023-01-06 NOTE — Telephone Encounter (Signed)
Writer spoke with pt who requested confirmation on the Zoloft dosage. This nurse confirmed that pt should be taking Zoloft 100 mg 2 tablets daily for a total dose of 200 mg QD. Pt verbalizes understanding.

## 2023-01-07 ENCOUNTER — Ambulatory Visit (INDEPENDENT_AMBULATORY_CARE_PROVIDER_SITE_OTHER): Payer: Medicare Other | Admitting: Licensed Clinical Social Worker

## 2023-01-07 DIAGNOSIS — F1020 Alcohol dependence, uncomplicated: Secondary | ICD-10-CM

## 2023-01-07 DIAGNOSIS — F411 Generalized anxiety disorder: Secondary | ICD-10-CM

## 2023-01-07 NOTE — Progress Notes (Signed)
Daily Group Progress Note   Program: [facility] IOP     Individual Time: 9 a.m. to 12 p.m.   Type of Therapy: Process and Psychoeducational    Topic: The therapist checks in with group members, assesses for SI/HI/psychosis and overall level of functioning. The therapist inquires about sobriety date and number of community support meetings attended since last session.    The therapist continues to show the video, "Breaking the Addiction Cycle" having group members complete the exercise concerning recounting one particular thing that caused them to see that their life had become unmanageable. The therapist facilitates a discussion on the personal use cycle giving group members the homework of detailing their use rituals.    Summary: Vernon Fowler presents rating his depression as a "0" and his anxiety as a "3."    He describes his mood as "optimistic," "hopeful," and "manic." The therapist talks with Vernon Fowler about his positive UDS for benzos which he likely received while in detox. A male member who sits next to Vernon Fowler in group tested positive for a benzo from detox as well so they discuss their results with each other and this therapist. Vernon Fowler recounts testing positive for benzos once from detox meds when he was at [facility] and being told they could be in his system for weeks. The therapist notes that the reason both Vernon Fowler and the other member are experiencing mood disturbance as they are effectively still detoxing.   During one of the discussions in group about seeking behavior, Vernon Fowler admits that she is shocked to realize how long he has been an alcoholic noting that he would find out if a party were going to serve alcohol when he was in his 28s and not go if it did not.  Vernon Fowler alleges that what caused him to see his life as unmanageable was the thought that he could die from this disease. He says that he was told that his fatty liver was producing too much iron; however, when he stopped drinking for a  while that it went back to normal. The therapist observes that Vernon Fowler has thus far not experiencing any serious health problems putting his life at risk so questions if this is what has caused him to view his life as having become unmanageable or if he has ever really reached this point.  The therapist notes that people can be motivated to go to detox because they feel sick; however, this does not mean that these same people will want to continue and seek recovery once they are no longer feeling sick but many do if they realize that their lives have become unmanageable due to the substance use.  Vernon Fowler requests to schedule a conjoint session with his fiance which the therapist notes he can schedule now; however, Vernon Fowler says that he has to get back with the therapist when he has his scheduling book. Vernon Fowler says that he recently changed banks with his new bank being right next door to an [place] [facility] store that he used to frequent which could be problematic.    Progress Towards Goals: Vernon Fowler reports no alcohol use.    UDS collected: No Results: Yes, positive for Nordiazepam and Oxazepam   AA/NA attended?: Yes   Sponsor?: Yes   Vernon Blazer, MA, LCSW, [place] [facility], LCAS 01/07/2023

## 2023-01-10 ENCOUNTER — Ambulatory Visit (INDEPENDENT_AMBULATORY_CARE_PROVIDER_SITE_OTHER): Payer: Medicare Other | Admitting: Licensed Clinical Social Worker

## 2023-01-10 DIAGNOSIS — F1020 Alcohol dependence, uncomplicated: Secondary | ICD-10-CM | POA: Diagnosis not present

## 2023-01-10 DIAGNOSIS — F411 Generalized anxiety disorder: Secondary | ICD-10-CM

## 2023-01-10 NOTE — Progress Notes (Signed)
Daily Group Progress Note   Program: [facility] IOP     Individual Time: 9 a.m. to 12 p.m.   Type of Therapy: Process and Psychoeducational    Topic: The therapist checks in with group members, assesses for SI/HI/psychosis and overall level of functioning. The therapist inquires about sobriety date and number of community support meetings attended since last session.    The therapist has group members review their homework from last group in which they were to describe their using rituals and he shows the last portion of the video, "Breaking the Addiction Cycle" focusing on things people can do to avoid and interrupt these rituals in a way that reminds them that they are in recovery and no longer in active addiction. The therapist observes that some members have delayed in getting a Sponsor and attending meetings though apparently having no explanation as to why. The therapist notes that there is always a reason that people do or do not do things per behavioral therapy which states, "all behavior is purposeful;" however, people are not always aware of or willing to admit these reasons to themselves and/or others.      Summary: Vernon Fowler presents rating his depression as a "1" and his anxiety as a "6."    His mood is "anxious" and "optimistic." He says that before he retired and when he was married that he would ask his wife if she needed something from [employer] giving him an excuse to buy wine. After he retired and got with his fiance, she got sick of his drinking so  took his keys, his license, and his debit card. Vernon Fowler figured out that he could take a picture of his license and use his phone to access his debit and have alcohol delivered to the house. He would pour the entire bottle into a cup and throw the bottle into the woods behind his house noting that he once retrieved too large garbage bags of bottles from this location. He says that he would drink coffee and have a cigarette in the morning telling  himself he was not doing this today but would have a craving around 3 or 4 p.m.   Vernon Fowler says that he now tries to not drink so much coffee in the morning to keep his anxiety down and does not sit on the sofa but tries to walk or exercise knowing that if he drinks too much coffee or sits on the sofa that it will be a trigger.   The therapist observes that Vernon Fowler's story illustrates that a person cannot stop someone who is determined to use and that Vernon Fowler found the relief from the excitement of getting his alcohol without getting caught by his fiance and added boost to his drinking.   Presently, his bank is across from an [facility] store so another group member recommends Vernon Fowler go to a different branch. The therapist recommends Vernon Fowler change his phone's wallpaper to something that reminds him his is in recovery. Vernon Fowler knows that watching [facility] or [employer] sports is a trigger to drink but refuses to stop doing so. Thus, the therapist questions what he can put around his TV or remote to remind him that he is in recovery.   The therapist again questions if Vernon Fowler has the desire to stop noting that a person can attend as many meetings as possible and go through all the other motions but without the desire to stop will not stop. Vernon Fowler says that his Sponsor too is focusing on  the question of desire.   Vernon Fowler is given the homework of what to do about the sports watching trigger issue and how he will interrupt this ritual.    Progress Towards Goals: Vernon Fowler reports no alcohol use.    UDS collected: Yes Results: No   AA/NA attended?: Yes   Sponsor?: Yes   [name], MA, LCSW, [facility], LCAS 01/10/2023

## 2023-01-12 ENCOUNTER — Ambulatory Visit (INDEPENDENT_AMBULATORY_CARE_PROVIDER_SITE_OTHER): Payer: Medicare Other | Admitting: Licensed Clinical Social Worker

## 2023-01-12 DIAGNOSIS — F411 Generalized anxiety disorder: Secondary | ICD-10-CM

## 2023-01-12 DIAGNOSIS — F1020 Alcohol dependence, uncomplicated: Secondary | ICD-10-CM

## 2023-01-12 NOTE — Progress Notes (Signed)
Daily Group Progress Note   Program: [facility] IOP     Individual Time: 9 a.m. to 12 p.m.   Type of Therapy: Process and Psychoeducational    Topic: The therapist checks in with group members, assesses for SI/HI/psychosis and overall level of functioning. The therapist inquires about sobriety date and number of community support meetings attended since last session.    The therapist presents information on and facilitates discussion about a number of topics today. He defines for group members what a standard drink is and what constitutes a binge episode for a male and for a male based on the number of standard drinks consumed. The therapist notes that it is not uncommon that people with addiction or alcoholism have friends and partners who are actively in addiction; however, while they may come to recognize it in themselves, they are often blind to it in those with whom they associate. The therapist shows a video on the three stages of relapse emphasizing that emotional relapse equates with poor self-care. The therapist explains what good self-care looks like both from a cognitive and a behavioral standpoint. He educates group members on caffeine intoxication and how keeping caffeine consumption in moderation and stopping after noon constitutes self-care. He educates group members on the old, AA suggestion about eating a piece of candy when one is trying to curb a craving to drink and how many people have severe sugar cravings early in their sobriety. The therapist notes that people in recovery can reach out to their non-addict friends and loved ones; however, the added benefit of reaching out to other addicts in recovery is that they are more able to understand what they are going through. He educates the group on the concept of relationship addiction. He also discusses the importance of paying attention to one's emotions or feelings in addition to one's thoughts. He states his opinion that people will share  their thoughts with acquaintances; however, will only share their feelings with people they trust as doing so involves a deeper level of intimacy. The therapist explains the reason that people in active addiction remain emotionally immature and how a person in a recovery quickly starts to outgrow this old relationships. Lastly, the therapist educates group members on the use of light therapist in relation to Seasonal Affective Disorder.      Summary: Vernon Fowler presents rating his depression as a "0" and his anxiety as a "3."    He describe his mood a being "calmer" and "grateful." When another group member shares about his plan to call one of his friends, who does not have issues with addiction, Vernon Fowler recounts how he attempted to do this in the past; however, the friend was not able to be helpful due to his lack of understanding of the disease of addiction. He says that the friend could not understand how Vernon Fowler could have the "willpower" to run marathons but to not drink.   During this group, the therapist addresses Vernon Fowler's caffeine consumption as he is going to get more coffee at break and says that his cutoff time for stopping caffeine is "8 p.m." He also admits that he is not holding to the specific amount of coffee he indicated would be his limit. The therapist notes that as Vernon Fowler says that his "anxiety" is a trigger to drink with his excessive caffeine consumption exacerbating his anxiety that this issue needs to be addressed. Vernon Fowler is somewhat overly talkative during the first half of group having tended to monopolize group at  times promoting AA; however, he is much mor quiet and attentive during the latter half of group.   He says that he did his homework in changing the background on his cell phone and put his "As Annette Stable Sees It" book  behind his television screen.    Progress Towards Goals: Vernon Fowler reports no alcohol use.    UDS collected: No Results: No   AA/NA attended?: Yes   Sponsor?: Yes    [name], MA, LCSW, [facility], LCAS 01/12/2023

## 2023-01-13 ENCOUNTER — Ambulatory Visit (INDEPENDENT_AMBULATORY_CARE_PROVIDER_SITE_OTHER): Payer: Medicare Other | Admitting: Licensed Clinical Social Worker

## 2023-01-13 DIAGNOSIS — F1020 Alcohol dependence, uncomplicated: Secondary | ICD-10-CM

## 2023-01-13 DIAGNOSIS — F411 Generalized anxiety disorder: Secondary | ICD-10-CM

## 2023-01-13 NOTE — Progress Notes (Signed)
Daily Group Progress Note   Program: [facility] IOP     Individual Time: 11 a.m. to 12 p.m.  The therapist meets with Vernon Fowler and his fiance today. [name]'s fiance agrees that Vernon Fowler has yet to experience any lasting consequences from his relapses. The therapist suggests that his fiance way wish to delay marrying him until he has about a year of sobriety unless prepared for the possibility that he will remain as he describes a "chronic relapser."   The therapist observes that Vernon Fowler says that he suspected his mom to be an alcoholic when in fact she was. He minimizes the severity by saying that she did not have the customary behaviors associated with drinkers as she drank secretly in her room. The therapist notes that Vernon Fowler adopted his mother's pattern of drinking secretly. He suggests that [name]'s belief that he did not drink alcoholically until later in life, which he is now seeing is not true, is a form of denial. He also is in denial about the impact of his drinking on his children as he always points to how he did not drink out-of-control when they were little and changed diapers and attended events; however, the therapist reminds him that he later missed a Father's Day surprise they arranged for him due to drinking. As Vernon Fowler has never asked them why they are so angry about his drinking and about how his drinking affected them, the therapist suggests that doing so at some point would be a good thing to do. Vernon Fowler talks about wanting to apologize; however, the therapist observes that Vernon Fowler has no idea for what he is apologizing.   Additionally, he points out that Vernon Fowler has not take the First Step as doing so implies behavior change in response to recognizing one's powerlessness over alcohol yet Vernon Fowler indicates that he cannot give up watching his [facility] and [place] [employer] sporting events though knowing they are a trigger. He also observes that Vernon Fowler seems to focus on his girlfriend's issues and issues of others in group  trying to help them as Vernon Fowler wants to do "service work;" however, the therapist advises Vernon Fowler that he is not ready for service work and needs to focus on his own recovery. He also points out that [name] tends to PR AA in group but it would be hard for people in group to likely take it as much of an endorsement of the program as Vernon Fowler has been unable to obtain sustained sobriety while attending the program.   [name], MA, LCSW, [facility], LCAS 01/13/2023

## 2023-01-14 ENCOUNTER — Ambulatory Visit (INDEPENDENT_AMBULATORY_CARE_PROVIDER_SITE_OTHER): Payer: Medicare Other | Admitting: Licensed Clinical Social Worker

## 2023-01-14 DIAGNOSIS — F1020 Alcohol dependence, uncomplicated: Secondary | ICD-10-CM

## 2023-01-14 DIAGNOSIS — F411 Generalized anxiety disorder: Secondary | ICD-10-CM

## 2023-01-14 NOTE — Progress Notes (Signed)
Daily Group Progress Note   Program: [facility] IOP     Individual Time: 9 a.m. to 12 p.m.   Type of Therapy: Process and Psychoeducational    Topic: The therapist checks in with group members, assesses for SI/HI/psychosis and overall level of functioning. The therapist inquires about sobriety date and number of community support meetings attended since last session.   The therapist facilitates discussions on the differences between helping and enabling, the reason that the Twelve Step program is one of attraction and not problem, why to avoid the "rescue fantasy," and the importance of being assertive and being able to ask for what one needs in relation to recovery. The therapist answers questions regarding the possible reasons a person could be involuntarily discharged from [facility].   The therapist says that the key takeaway for today's meeting should be that being sober is not enough. The therapist discusses how being counter-dependent is not less unhealthy than being dependent and that healthy is somewhere between the middle of the two extremes.    Summary: Vernon Fowler presents rating his depression as a "2" and his anxiety as a "5."    He describe his mood a being "anxious" and "introspective." He says that he is more cognizant of his using rituals and the fact that he "minimizes the past." He says that he has gotten more done in the past three weeks than in a long time.  Vernon Fowler says that when he was attending a Zoom AA meeting that he had a "huge craving" for no apparent reason. During the check-in, Vernon Fowler has to redirect Vernon Fowler to talk about what is going on with him versus making recommendations or telling stories about others. He does reduce the amount of talking he does in today's group versus previous meetings.   Progress Towards Goals: Vernon Fowler reports no alcohol use.    UDS collected: No Results: Yes, positive for Nordiazepam and Oxazepam   AA/NA attended?: Yes   Sponsor?: Yes   [name], MA,  LCSW, [facility], LCAS 01/14/2023

## 2023-01-17 ENCOUNTER — Ambulatory Visit (INDEPENDENT_AMBULATORY_CARE_PROVIDER_SITE_OTHER): Payer: Medicare Other | Admitting: Licensed Clinical Social Worker

## 2023-01-17 DIAGNOSIS — F411 Generalized anxiety disorder: Secondary | ICD-10-CM

## 2023-01-17 DIAGNOSIS — F1020 Alcohol dependence, uncomplicated: Secondary | ICD-10-CM

## 2023-01-17 NOTE — Progress Notes (Signed)
Daily Group Progress Note   Program: [facility] IOP     Individual Time: 9 a.m. to 12 p.m.   Type of Therapy: Process and Psychoeducational    Topic: The therapist checks in with group members, assesses for SI/HI/psychosis and overall level of functioning. The therapist inquires about sobriety date and number of community support meetings attended since last session.   The therapist discusses the differences between assertive, aggressive, passive, and passive-aggressive responses and explains how being assertive relates to recovery from addiction. The therapist encourages limit setting in relation to avoiding people, places, and things.    Summary: Vernon Fowler presents rating his depression as a "2" and his anxiety as a "2."    He describe his mood a being "centered," "grateful" and "painful." The latter relates to leg pain he is now experiencing which is related to an accident that he had years ago when drinking. He says that this pain is interrupting his sleep some and that he is going to see the doctor about his leg.  At the same time, he says that he is cutting off his caffeine now at "7 p.m." The therapist observes that Vernon Fowler continues to drink excessive amounts of caffeine much too late into the evening in spite of having been repeatedly directed to not do this given his history of anxiety.  Vernon Fowler participates in today's group discussions talking about how he had to ask his son-in-law to not bring alcohol to his house and about how he, like another group member, has experienced the empty nest syndrome.    Progress Towards Goals: Vernon Fowler reports no alcohol use.    UDS collected: Yes Results: No   AA/NA attended?: Yes   Sponsor?: Yes   Vernon Blazer, MA, LCSW, [facility], LCAS 01/17/2023

## 2023-01-19 ENCOUNTER — Ambulatory Visit (INDEPENDENT_AMBULATORY_CARE_PROVIDER_SITE_OTHER): Payer: Medicare Other | Admitting: Licensed Clinical Social Worker

## 2023-01-19 ENCOUNTER — Telehealth (HOSPITAL_COMMUNITY): Payer: Self-pay | Admitting: Licensed Clinical Social Worker

## 2023-01-19 DIAGNOSIS — F1020 Alcohol dependence, uncomplicated: Secondary | ICD-10-CM | POA: Diagnosis not present

## 2023-01-19 DIAGNOSIS — F1110 Opioid abuse, uncomplicated: Secondary | ICD-10-CM

## 2023-01-19 DIAGNOSIS — F411 Generalized anxiety disorder: Secondary | ICD-10-CM

## 2023-01-19 NOTE — Progress Notes (Unsigned)
Daily Group Progress Note   Program: [facility] IOP     Individual Time: 9 a.m. to 12 p.m.   Type of Therapy: Process and Psychoeducational    Topic: The therapist checks in with group members, assesses for SI/HI/psychosis and overall level of functioning. The therapist inquires about sobriety date and number of community support meetings attended since last session.    The therapist facilitates a group discussion on the Assertive Bill of Rights and talks about how relationships can change as a result of a person being in recovery. The therapist notes that not everyone will like the new-and-improved version such that relationships can be lost while new, healthier ones will be gained. The therapist talks about the importance of family members and loved ones who do not have the disease of addiction being educated about it such that they know how best to support a person in recovery. The therapist, again, explains the benefits of attending problems like AA and NA as this allows people in recovery to interact with others who understand what they are going through. The therapist discusses the different types of meetings and ask group members who attend which type of meetings they like the most and the least.   Group concludes with a discussion regarding what is meant by saying that one is "stuck in self-will" and how being in this state prevents people from humbly taking and following the advice of trained professionals and those with decades of recovery.    Summary: Vernon Fowler presents rating his depression as a "1" and his anxiety as a "4."    He describe his mood as "grateful," "tired," "anxious," and "hopeful." He says that he is about to pick up a 30 day chip "again." Vernon Fowler is active in today's discussion regarding assertiveness.  In discussing self-will, the therapist notes that [name]'s decision to continue drinking coffee all day and into the evening in spite of his anxiety and his previous statement about  having to watch his sports teams though doing so is a trigger for him would be examples of self-will. Vernon Fowler notes that this therapist gave him the homework of putting a recovery reminder in front of his television screen for when he watches sporting events; however, the therapist informs Vernon Fowler that he did so; however, after reflecting on this situation further, realized that placing such a reminder would not be necessary if Vernon Fowler were to simply avoid the trigger altogether putting his recovery first.    Progress Towards Goals: Vernon Fowler reports no alcohol use.    UDS collected: No Results: No   AA/NA attended?: Yes   Sponsor?: Yes   [name], MA, LCSW, [facility], LCAS 01/19/2023

## 2023-01-19 NOTE — Telephone Encounter (Signed)
The therapist receives a call from [facility] saying that he received a call from the Orthopedist about an appointment for his knee and that the only time he has is this Friday, 01/21/23 at 10 a.m. so he will not be in group on Friday but will return to group next week.   [address] MA, LCSW, [facility], LCAS 01/19/2023

## 2023-01-21 ENCOUNTER — Encounter (HOSPITAL_COMMUNITY): Payer: Self-pay

## 2023-01-21 ENCOUNTER — Ambulatory Visit (HOSPITAL_COMMUNITY): Payer: Medicare Other

## 2023-01-26 ENCOUNTER — Ambulatory Visit (INDEPENDENT_AMBULATORY_CARE_PROVIDER_SITE_OTHER): Payer: Medicare Other | Admitting: Licensed Clinical Social Worker

## 2023-01-26 DIAGNOSIS — F1020 Alcohol dependence, uncomplicated: Secondary | ICD-10-CM | POA: Diagnosis not present

## 2023-01-26 DIAGNOSIS — F411 Generalized anxiety disorder: Secondary | ICD-10-CM

## 2023-01-26 NOTE — Progress Notes (Signed)
Daily Group Progress Note   Program: [facility] IOP     Individual Time: 9 a.m. to 11:40 a.m.    Type of Therapy: Process and Psychoeducational    Topic: The therapist checks in with group members, assesses for SI/HI/psychosis and overall level of functioning. The therapist inquires about sobriety date and number of community support meetings attended since last session.    The therapist introduces a new group member and educates group members on Delta-8, Kratom, and other legal but elicit substances that should be avoided. He talks about how the same principles that work in quitting drugs and alcohol i.e. avoiding people, places, and things can be applied to quitting nicotine products. The therapist again discusses the importance of learning how to be assertive in recovery so as to set limits and be able to tell others what one wants or needs. He notes that everyone with addiction definitely will hit a bottom; however, unfortunately, for some, this will be death. He explains that in latter stages of addiction that people hit a point of no return in which they can stop and have no permanent damage; however, if they continue will cause irreversible damage. The therapist validates that many people's mood symptoms are substance-induced; however, points out that there are people who are dually diagnoses and thus must have both conditions treated concurrently for treatment to be successful.    Summary: Vernon Fowler presents rating his depression as a "0" and his anxiety as a "2."    He describe his mood as "optimistic" and "less anxiety." He says that when he stopped drinking that he seemed almost "manic" taking on so many tasks. He has an app that tracks his screen time on his phone and realized he was spending 5 hours per day on his phone saying that he has this down to 2 hours per day.  Vernon Fowler says that he has issues with anxiety and is working to "calm down a little bit." The therapist inquires about his caffeine  cutoff with Vernon Fowler saying that he stops caffeine now at 6 p.m. After some discussion, he agrees to stopping caffeine after noon with the understanding that he can consume the same amount he normally consumes in a day prior to this time. The therapist continues to make the observation that this therapist and other providers have all told him the same thing; however, Vernon Fowler has thus far refused to follow medical advice.  He says that he is working on the first step and on desire. The therapist presses him again on his reasons for stopping. He once more focuses on his health noting that he has had elevated liver enzymes but that his disease would not how quickly he would "bounce back" to normal levels upon cessation of drinking. The therapist informs Vernon Fowler that there will eventually be a point at which he does not "bounce back" but has cirrhosis of the liver. Vernon Fowler says that the impending birth of grandsons is another motivation to stop as he does not want to go to see his grandsons only to have to leave in a hurry to drink. He admits that he does not have another detox in him.   Vernon Fowler receives a call during group and apologizes for having to leave early to meet a Technician arriving at his house at noon earlier than expected. He leaves group without providing a UDS.    Progress Towards Goals: Vernon Fowler reports no alcohol use.    UDS collected: No Results: Yes, positive for Oxazepam  AA/NA attended?: Yes   Sponsor?: Yes   [name], MA, LCSW, [facility], LCAS 01/26/2023

## 2023-01-28 ENCOUNTER — Ambulatory Visit (INDEPENDENT_AMBULATORY_CARE_PROVIDER_SITE_OTHER): Payer: Medicare Other | Admitting: Licensed Clinical Social Worker

## 2023-01-28 ENCOUNTER — Telehealth (HOSPITAL_COMMUNITY): Payer: Self-pay | Admitting: Psychiatry

## 2023-01-28 DIAGNOSIS — F1020 Alcohol dependence, uncomplicated: Secondary | ICD-10-CM | POA: Diagnosis not present

## 2023-01-28 DIAGNOSIS — F411 Generalized anxiety disorder: Secondary | ICD-10-CM

## 2023-01-28 NOTE — Progress Notes (Signed)
Daily Group Progress Note   Program: [facility] IOP     Individual Time: 9 a.m. to 12 p.m.    Type of Therapy: Process and Psychoeducational    Topic: The therapist checks in with group members, assesses for SI/HI/psychosis and overall level of functioning. The therapist inquires about sobriety date and number of community support meetings attended since last session.    The therapist spends a great deal of time emphasizing the importance of group members staying committed to substance use treatment and meetings until they have at least one year of sobriety noting the pitfalls of stopping sooner than this. He also educates group members about how long they should stay on anti-depressant medication based on the number of episodes of clinical depression they have had in their life times.   He shows the remainder of the video on the neuroscience of addiction by Dr. Monico Hoar emphasizing the symptoms of loss of control and craving as being diagnostic for substance use disorders and how people can be in denial about not having an addiction as a result of being abel to choose to not use when in Court or at work while still having cravings to use. He explains that it is not normative for the majority of the population to turn to drugs and alcohol through a difficult life event such as a divorce or increase their alcohol use in response to this. He notes that these individuals do develop mental health symptoms; however, this does not automatically cause substance use.    Summary: Vernon Fowler presents rating his depression as a "0" and his anxiety as a "4."    He describe his mood as "happy" and "proud." After last group, he says that he started cutting his caffeine consumption off after noon and not surprisingly has noticed a reduction in his anxiety. He admits that this therapist has been encouraging him to do this for over a year.   Vernon Fowler talks about trying to plan his first trip to [place] with his fiance and  the challenges he is facing in avoiding triggers to drink and finding some sort of non-alcohol-related travel package and place to go.   During the group discussion, Vernon Fowler is amazed to realize that things like a horrible divorce are not necessarily causative of drinking or increased drinking in the 90% of the population who are not addicts.   He offers to attend a meeting with another group member today who lives in his area.    Progress Towards Goals: Vernon Fowler reports no alcohol use.    UDS collected: No Results: No   AA/NA attended?: Yes   Sponsor?: Yes   [name], MA, LCSW, [facility], LCAS 01/28/2023

## 2023-01-31 ENCOUNTER — Encounter (HOSPITAL_COMMUNITY): Payer: Self-pay

## 2023-01-31 ENCOUNTER — Ambulatory Visit (HOSPITAL_COMMUNITY): Payer: Medicare Other | Admitting: Licensed Clinical Social Worker

## 2023-01-31 ENCOUNTER — Other Ambulatory Visit (HOSPITAL_COMMUNITY): Payer: Self-pay | Admitting: Medical

## 2023-01-31 ENCOUNTER — Ambulatory Visit (HOSPITAL_COMMUNITY): Payer: Medicare Other | Admitting: Psychiatry

## 2023-01-31 ENCOUNTER — Telehealth (HOSPITAL_COMMUNITY): Payer: Self-pay | Admitting: Licensed Clinical Social Worker

## 2023-01-31 NOTE — Telephone Encounter (Addendum)
[name] leaves a voicemail with this therapist informing him that he is in [place] helping his son-in-law clean up his backyard and as they are not finished, he will stay another day and thus miss group today.  [name], MA, LCSW, [facility], LCAS 01/31/2023  UDS 01/17/2023 is + for metabolites of benzodiazepene Valium/diazepam

## 2023-02-02 ENCOUNTER — Ambulatory Visit (INDEPENDENT_AMBULATORY_CARE_PROVIDER_SITE_OTHER): Payer: Medicare Other | Admitting: Licensed Clinical Social Worker

## 2023-02-02 DIAGNOSIS — F1020 Alcohol dependence, uncomplicated: Secondary | ICD-10-CM | POA: Diagnosis not present

## 2023-02-02 DIAGNOSIS — F411 Generalized anxiety disorder: Secondary | ICD-10-CM

## 2023-02-02 NOTE — Progress Notes (Addendum)
Daily Group Progress Note   Program: [facility] IOP     Group Time: 9 a.m. to 12 p.m.    Type of Therapy: Process and Psychoeducational    Topic: The therapist checks in with group members, assesses for SI/HI/psychosis and overall level of functioning. The therapist inquires about sobriety date and number of community support meetings attended since last session.   The therapist explains why it is important that a person understand that his or her addiction is a disease and not a moral weakness as people will literally drink or drug themselves to death to "prove" they do not have a moral weakness but will not do the same when faced with a disease. The therapist reiterates some of the genetic and biological reasons that addiction is classified as a disease. He reiterates that one's personal life should revolve around recovery and that recovery should not revolve around a person's personal life. He discusses the importance of developing an action plan to avoid triggers versus falling into a shame spiral which allows nothing productive to happen and prevents any learning from the relapse. He facilitates a discussion on emotional triggers for relapse such as boredom which is a common complaint in those in early recovery due to a lack of Dopamine receptors.    Summary: Vernon Fowler presents rating his depression as a "1" and his anxiety as a "3."    He describe his mood as "content" and "hopeful" while at the same time admitting that he has a new sobriety date. He says that he had 9 weeks of sobriety and went to his daughter's to help his son-in-law clear his backyard thus not getting back for group on Monday. He was thirsty and started drinking non-alcoholic beers that his son-in-law had which triggered him such that when he went in the house and found three-quarters of a bottle of wine in the refrigerator, he drank it. Vernon Fowler tears up in talking about feeling hopeless about being able to get better and beating up on  himself for not calling his Sponsor.  He says that he knows the "mechanics" but keeps doing it; however, the therapist points out that there were mechanics involved in this relapse. The therapist suggests that Vernon Fowler needs to come up with an action plan which involves avoiding triggers with the triggers being watching sports and going to people's houses and assuming alcohol will not be there. Vernon Fowler needs to confirm whether or not alcohol will be there. He also is not holding fast to his 12 p.m. cutoff for caffeine with the therapist suggesting that Vernon Fowler needs to rigidly follow his recovery guidelines. The therapist points out the fact that if Vernon Fowler had scheduled his personal life around recovery that he would have been in CD IOP on Monday rather than at his son-in-law's relapsing.    Progress Towards Goals: Vernon Fowler reports relapsing with his new sobriety date being yesterday.     UDS collected: Yes Results: No   AA/NA attended?: Yes   Sponsor?: Yes   [name], MA, LCSW, [facility], LCAS 02/02/2023

## 2023-02-04 ENCOUNTER — Ambulatory Visit (INDEPENDENT_AMBULATORY_CARE_PROVIDER_SITE_OTHER): Payer: Medicare Other | Admitting: Licensed Clinical Social Worker

## 2023-02-04 ENCOUNTER — Other Ambulatory Visit (HOSPITAL_COMMUNITY): Payer: Self-pay

## 2023-02-04 ENCOUNTER — Telehealth (HOSPITAL_COMMUNITY): Payer: Self-pay

## 2023-02-04 DIAGNOSIS — F1020 Alcohol dependence, uncomplicated: Secondary | ICD-10-CM | POA: Diagnosis not present

## 2023-02-04 DIAGNOSIS — F411 Generalized anxiety disorder: Secondary | ICD-10-CM

## 2023-02-04 MED ORDER — PREGABALIN 150 MG PO CAPS: ORAL_CAPSULE | ORAL | 0 refills | Status: DC

## 2023-02-04 NOTE — Progress Notes (Addendum)
Daily Group Progress Note   Program: [facility] IOP     Group Time: 9 a.m. to 10:30 a.m.   Type of Therapy: Process and Psychoeducational    Topic: The therapist checks in with group members, assesses for SI/HI/psychosis and overall level of functioning. The therapist inquires about sobriety date and number of community support meetings attended since last session.   The therapist answers patients questions about psychotropic medications regarding the reasons that Seroquel and Gabapentin are used. He talks about the benefits of exercise in relation to reducing the intensity and length of PAWS. He suggests techniques for overcoming resistant to exercise and recommends setting small, attainable goals versus taking on too many tasks at one. He explains that people have a finite amount of willpower or energy and make too many changes at one time can deplete this reserve. He discusses how to use assertiveness as a means of dealing with triggers and answers the question concerning, "what is a dry drunk?" He emphasizes the importance of developing sober supports while talking about the difficulty in doing so noting that people can recovery can ask those with years of recovery how they overcame these obstacles without having to reinvent the wheel.  The therapist shows a video by Orlin Hilding illustrating that there is no such thing as a soft drug versus a hard drug and no really differences between an alcoholic and an IV drug users. The therapist facilitates a group discussion eliciting feedback from members concerning their thoughts and reaction to this information.    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 The therapist  observes that [name] has done nothing to address how he will deal with triggers in this action plan. He says that he is not watching [employer] basketball. The therapist suggests [name] talk to family members before visiting their houses to assure alcohol is secure modeling how to do this and eliciting feedback from the group about this and [name]'s action plan. [name] admits that he has never taken this step. The therapist notes that just as one must child proof a home for toddlers that the homes in which Bessie enters for now much be alcohol proof.  Group members observe that [name] likely has too many changes or things to do on his action plan. Thus, [name] says that walking three times a week is a priority. He apparently tries to do this in the afternoon when more tired so is often not successful in following through. Another member notes that as [name] is wanting to change his morning routine that he can perhaps change is to walking his dogs in the morning when he has more energy with [name] saying this may work.  [name] says that he has to leave group early today having been called into see his Orthopedist from a call list.    Progress Towards Goals: [name] reports no alcohol use since last group.    UDS collected: No Results: No   AA/NA attended?: Yes   Sponsor?: Yes   [name], MA, LCSW, [facility], LCAS 02/04/2023

## 2023-02-04 NOTE — Telephone Encounter (Signed)
I received a call from [facility] to please refill Lyrica. I called pharmacy and gave verbal order for generic Lyrica (pregabalin) 150 mg take 1-3 hours before bedtime, #30 with no refill.

## 2023-02-07 ENCOUNTER — Ambulatory Visit (HOSPITAL_COMMUNITY): Payer: Medicare Other

## 2023-02-07 ENCOUNTER — Encounter (HOSPITAL_COMMUNITY): Payer: Self-pay

## 2023-02-07 ENCOUNTER — Telehealth (HOSPITAL_COMMUNITY): Payer: Self-pay | Admitting: Licensed Clinical Social Worker

## 2023-02-07 NOTE — Telephone Encounter (Signed)
Vernon Fowler leaves a voicemail saying that he will not be in group today and that he will disclose the reason for this on Wednesday when he returns.  [address], MA, LCSW, [facility], LCAS 02/07/2023

## 2023-02-09 ENCOUNTER — Ambulatory Visit (INDEPENDENT_AMBULATORY_CARE_PROVIDER_SITE_OTHER): Payer: Medicare Other | Admitting: Licensed Clinical Social Worker

## 2023-02-09 ENCOUNTER — Encounter (HOSPITAL_COMMUNITY): Payer: Self-pay | Admitting: Medical

## 2023-02-09 DIAGNOSIS — F17210 Nicotine dependence, cigarettes, uncomplicated: Secondary | ICD-10-CM

## 2023-02-09 DIAGNOSIS — F419 Anxiety disorder, unspecified: Secondary | ICD-10-CM

## 2023-02-09 DIAGNOSIS — F1110 Opioid abuse, uncomplicated: Secondary | ICD-10-CM

## 2023-02-09 DIAGNOSIS — T7402XS Child neglect or abandonment, confirmed, sequela: Secondary | ICD-10-CM

## 2023-02-09 DIAGNOSIS — F1020 Alcohol dependence, uncomplicated: Secondary | ICD-10-CM

## 2023-02-09 DIAGNOSIS — F341 Dysthymic disorder: Secondary | ICD-10-CM

## 2023-02-09 DIAGNOSIS — I10 Essential (primary) hypertension: Secondary | ICD-10-CM

## 2023-02-09 DIAGNOSIS — Z659 Problem related to unspecified psychosocial circumstances: Secondary | ICD-10-CM

## 2023-02-09 DIAGNOSIS — G894 Chronic pain syndrome: Secondary | ICD-10-CM

## 2023-02-09 DIAGNOSIS — Z6372 Alcoholism and drug addiction in family: Secondary | ICD-10-CM

## 2023-02-09 DIAGNOSIS — Z79899 Other long term (current) drug therapy: Secondary | ICD-10-CM

## 2023-02-09 DIAGNOSIS — E890 Postprocedural hypothyroidism: Secondary | ICD-10-CM

## 2023-02-09 DIAGNOSIS — E785 Hyperlipidemia, unspecified: Secondary | ICD-10-CM

## 2023-02-09 MED ORDER — ACAMPROSATE CALCIUM 333 MG PO TBEC: 666.0000 mg | DELAYED_RELEASE_TABLET | Freq: Three times a day (TID) | ORAL | 2 refills | Status: AC

## 2023-02-09 MED ORDER — PROPRANOLOL HCL 20 MG PO TABS: ORAL_TABLET | ORAL | 2 refills | Status: DC

## 2023-02-09 MED ORDER — PREGABALIN 150 MG PO CAPS: 150.0000 mg | ORAL_CAPSULE | Freq: Three times a day (TID) | ORAL | 2 refills | Status: DC

## 2023-02-09 MED ORDER — PREGABALIN 150 MG PO CAPS: 150.0000 mg | ORAL_CAPSULE | Freq: Three times a day (TID) | ORAL | 2 refills | Status: AC
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
## 2023-02-10 NOTE — Progress Notes (Signed)
Daily Group Progress Note   Program: CD IOP     Group Time: 9 a.m. to 12 p.m.    Type of Therapy: Process and Psychoeducational    Topic: The therapist checks in with group members, assesses for SI/HI/psychosis and overall level of functioning. The therapist inquires about sobriety date and number of community support meetings attended since last session.   The therapist presents information supporting the reason that "[place] sober" greatly increases a person's likelihood of drinking and/or relapsing and how other addictive substances have the same potential. He also educates members about cannabis hyperemesis. He explains how alcohol and other chemical disinhibit a person's prefrontal cortex and how this plays a part in relapse. Additionally, he presents information on managing dual diagnoses i.e. addiction and co-occurring mental health disorders and how best to advocate for one's self in relation to interacting with health care professionals in order to get the best care.        Summary: [name] presents rating his depression as a "1" and his anxiety as a "4."    [name] reports having relapsed on Monday. He says that he woke up and was extremely shaky so his disease told him that he needed to take a drink to steady himself. He says that his fiance is now sleeping in another bedroom and his affect appears very distressed.   He is active in today's discussion with most of his questions centering on how he can advocate for himself in relation to interacting with other providers. He questions how it is that his medical providers found out about his alcohol use disorder. The therapist explains that this is likely due to his alcohol-related ER visits as there is a Conservation officer, nature between his behavioral health and medical notes in the [facility] system such that he would need to sign a ROI for his medical providers to see these notes.  The therapist informs [name] that Friday's group will be talking about "half  measures" observing that [name] was starting to slip into old patters in regard to leaving group early, missing group for another appointment, etcetera. [name] says that he looked ahead and cleared his calendar such that IOP is the priority. The therapist informs [name] that he will continue to drink until he realizes that drinking is never an option even in the event of the death of a family member or some other life tragedy. [name] admits that if something were to happen to one of his daughters that he would likely drink.    Progress Towards Goals: [name] drank on Monday   UDS collected: No Results: positive for ETOH   AA/NA attended?: Yes   Sponsor?: Yes   [name], MA, LCSW, [facility], LCAS 02/09/2023

## 2023-02-11 ENCOUNTER — Encounter (HOSPITAL_COMMUNITY): Payer: Self-pay

## 2023-02-11 ENCOUNTER — Telehealth (HOSPITAL_COMMUNITY): Payer: Self-pay | Admitting: Licensed Clinical Social Worker

## 2023-02-11 ENCOUNTER — Ambulatory Visit (HOSPITAL_COMMUNITY): Payer: Medicare Other | Admitting: Licensed Clinical Social Worker

## 2023-02-11 NOTE — Telephone Encounter (Signed)
The therapist attempts to reach [place] leaving a HIPAA-compliant voicemail.  [address], MA, LCSW, [facility], LCAS 02/11/2023

## 2023-02-14 ENCOUNTER — Ambulatory Visit (HOSPITAL_COMMUNITY): Payer: Medicare Other | Admitting: Licensed Clinical Social Worker

## 2023-02-14 DIAGNOSIS — F411 Generalized anxiety disorder: Secondary | ICD-10-CM

## 2023-02-14 DIAGNOSIS — F1020 Alcohol dependence, uncomplicated: Secondary | ICD-10-CM

## 2023-02-14 DIAGNOSIS — F17210 Nicotine dependence, cigarettes, uncomplicated: Secondary | ICD-10-CM

## 2023-02-14 NOTE — Progress Notes (Signed)
Daily Group Progress Note   Program: CD IOP     Group Time: 9 a.m. to 12 p.m.    Type of Therapy: Process and Psychoeducational    Topic: The therapist checks in with group members, assesses for SI/HI/psychosis and overall level of functioning. The therapist inquires about sobriety date and number of community support meetings attended since last session.    The therapist facilitates discussions on how to deal with "toxic" family members in relation to one's sobriety while continuing to illustrate how "half-measures" will not work in relation to addiction recovery. The therapist begins talking about emotional triggers, distress tolerance skills, and the concepts of acceptance and commitment therapy i.e. the fact that recovery involves the ability to have to learn to sit with unpleasant emotions from time to time while not giving in to the temptation to medicate them away.       Summary: Vernon Fowler presents rating his depression as a "0" and his anxiety as a "4."    Vernon Fowler says that he left group on Friday feeling determined but ended up drinking again yesterday. He says that his fiance found out that he cancelled their going to see the [employer] band and started hollering at him. During this argument, he did not tell her that going was a "huge trigger" for him and she told him, "I can't believe you would do that without talking to me first."  Vernon Fowler drove to get cigarettes and then bought a bottle of wine which "suppresses" his emotions noting that he had a "huge case of the f__ its."   The therapist suggests that Vernon Fowler could have told his fiance that he could not go to this concert as it presented a trigger for him; however, as it did not present a trigger for her, could have told her to find a friend to go with her using the other ticket.  Vernon Fowler, as is customary for him, attempts to make helpful suggestions and give recovery advice aimed at other group members.The therapist informs Vernon Fowler that he  is going to have to learn how to sit with unpleasant emotions without having to drink them away if he is to gain long-term sobriety.  Vernon Fowler spends a great deal of time once again focusing on the question if he were made an alcoholic by his career versus being born one. The therapist observes that Vernon Fowler's family history certainly gives him the genetic pedigree; however, ultimately points out that the answer makes no difference whatsoever in providing information as to how to stop drinking.    Progress Towards Goals: Vernon Fowler drank yesterday   UDS collected: Yes Results: No   AA/NA attended?: Yes   Sponsor?: Yes   Vernon Blazer, MA, LCSW, [facility], LCAS 02/14/2023

## 2023-02-16 ENCOUNTER — Telehealth (HOSPITAL_COMMUNITY): Payer: Self-pay | Admitting: Licensed Clinical Social Worker

## 2023-02-16 ENCOUNTER — Encounter (HOSPITAL_COMMUNITY): Payer: Self-pay

## 2023-02-16 ENCOUNTER — Ambulatory Visit (HOSPITAL_COMMUNITY): Payer: Medicare Other | Admitting: Licensed Clinical Social Worker

## 2023-02-16 NOTE — Telephone Encounter (Signed)
The therapist attempts to reach [facility] after his no show for group leaving a HIPAA-compliant voicemail.  [address] MA, LCSW, [facility], LCAS 02/16/2023

## 2023-02-17 ENCOUNTER — Institutional Professional Consult (permissible substitution) (HOSPITAL_BASED_OUTPATIENT_CLINIC_OR_DEPARTMENT_OTHER): Payer: Medicare Other | Admitting: Pulmonary Disease

## 2023-02-18 ENCOUNTER — Ambulatory Visit (HOSPITAL_COMMUNITY): Payer: Medicare Other | Admitting: Licensed Clinical Social Worker

## 2023-02-18 ENCOUNTER — Other Ambulatory Visit (HOSPITAL_COMMUNITY): Admission: EM | Admit: 2023-02-18 | Payer: Medicare Other | Source: Home / Self Care | Admitting: Family

## 2023-02-18 ENCOUNTER — Telehealth (HOSPITAL_COMMUNITY): Payer: Self-pay | Admitting: Licensed Clinical Social Worker

## 2023-02-18 ENCOUNTER — Other Ambulatory Visit (INDEPENDENT_AMBULATORY_CARE_PROVIDER_SITE_OTHER)
Admission: EM | Admit: 2023-02-18 | Discharge: 2023-02-20 | Disposition: A | Discharge status: Home / Self Care | Payer: Medicare Other | Source: Home / Self Care | Admitting: Family

## 2023-02-18 ENCOUNTER — Telehealth (HOSPITAL_COMMUNITY): Payer: Self-pay | Admitting: Psychiatry

## 2023-02-18 ENCOUNTER — Other Ambulatory Visit (HOSPITAL_COMMUNITY)
Admission: EM | Admit: 2023-02-18 | Discharge: 2023-02-18 | Payer: Medicare Other | Attending: Psychiatry | Admitting: Psychiatry

## 2023-02-18 ENCOUNTER — Encounter (HOSPITAL_COMMUNITY): Payer: Self-pay

## 2023-02-18 DIAGNOSIS — F1994 Other psychoactive substance use, unspecified with psychoactive substance-induced mood disorder: Secondary | ICD-10-CM | POA: Insufficient documentation

## 2023-02-18 DIAGNOSIS — F1020 Alcohol dependence, uncomplicated: Secondary | ICD-10-CM | POA: Diagnosis not present

## 2023-02-18 DIAGNOSIS — F1010 Alcohol abuse, uncomplicated: Secondary | ICD-10-CM

## 2023-02-18 DIAGNOSIS — F29 Unspecified psychosis not due to a substance or known physiological condition: Secondary | ICD-10-CM | POA: Insufficient documentation

## 2023-02-18 DIAGNOSIS — F329 Major depressive disorder, single episode, unspecified: Secondary | ICD-10-CM | POA: Insufficient documentation

## 2023-02-18 DIAGNOSIS — F1090 Alcohol use, unspecified, uncomplicated: Secondary | ICD-10-CM | POA: Diagnosis present

## 2023-02-18 DIAGNOSIS — Z818 Family history of other mental and behavioral disorders: Secondary | ICD-10-CM | POA: Insufficient documentation

## 2023-02-18 DIAGNOSIS — F32A Depression, unspecified: Secondary | ICD-10-CM | POA: Diagnosis not present

## 2023-02-18 LAB — POCT URINE DRUG SCREEN - MANUAL ENTRY (I-SCREEN)
POC Amphetamine UR: NOT DETECTED
POC Buprenorphine (BUP): NOT DETECTED
POC Cocaine UR: NOT DETECTED
POC Marijuana UR: NOT DETECTED
POC Methadone UR: NOT DETECTED
POC Methamphetamine UR: NOT DETECTED
POC Morphine: NOT DETECTED
POC Oxazepam (BZO): POSITIVE — AB
POC Oxycodone UR: NOT DETECTED
POC Secobarbital (BAR): NOT DETECTED

## 2023-02-18 MED ORDER — LORAZEPAM 1 MG PO TABS: 1.0000 mg | ORAL_TABLET | Freq: Every day | ORAL | Status: DC

## 2023-02-18 MED ORDER — LOPERAMIDE HCL 2 MG PO CAPS
2.0000 mg | ORAL_CAPSULE | ORAL | Status: DC | PRN
  Administered 2023-02-19: 4 mg via ORAL
  Filled 2023-02-18: qty 2

## 2023-02-18 MED ORDER — PANTOPRAZOLE SODIUM 40 MG PO TBEC
80.0000 mg | DELAYED_RELEASE_TABLET | Freq: Every day | ORAL | Status: DC
  Administered 2023-02-19 – 2023-02-20 (×2): 80 mg via ORAL
  Filled 2023-02-18 (×2): qty 2

## 2023-02-18 MED ORDER — PANTOPRAZOLE SODIUM 40 MG PO TBEC: 80.0000 mg | DELAYED_RELEASE_TABLET | Freq: Every day | ORAL | Status: DC

## 2023-02-18 MED ORDER — THIAMINE HCL 100 MG/ML IJ SOLN
100.0000 mg | Freq: Once | INTRAMUSCULAR | Status: AC
  Administered 2023-02-18: 100 mg via INTRAMUSCULAR
  Filled 2023-02-18: qty 2

## 2023-02-18 MED ORDER — LORAZEPAM 1 MG PO TABS: 1.0000 mg | ORAL_TABLET | Freq: Four times a day (QID) | ORAL | Status: DC

## 2023-02-18 MED ORDER — LOPERAMIDE HCL 2 MG PO CAPS: 2.0000 mg | ORAL_CAPSULE | ORAL | Status: DC | PRN

## 2023-02-18 MED ORDER — THIAMINE HCL 100 MG/ML IJ SOLN: 100.0000 mg | Freq: Once | INTRAMUSCULAR | Status: DC

## 2023-02-18 MED ORDER — LORAZEPAM 1 MG PO TABS: 1.0000 mg | ORAL_TABLET | Freq: Three times a day (TID) | ORAL | Status: DC

## 2023-02-18 MED ORDER — THIAMINE MONONITRATE 100 MG PO TABS: 100.0000 mg | ORAL_TABLET | Freq: Every day | ORAL | Status: DC

## 2023-02-18 MED ORDER — LORAZEPAM 1 MG PO TABS: 1.0000 mg | ORAL_TABLET | Freq: Four times a day (QID) | ORAL | Status: DC | PRN

## 2023-02-18 MED ORDER — BACLOFEN 10 MG PO TABS
10.0000 mg | ORAL_TABLET | Freq: Three times a day (TID) | ORAL | Status: DC
  Administered 2023-02-18 – 2023-02-20 (×5): 10 mg via ORAL
  Filled 2023-02-18 (×5): qty 1

## 2023-02-18 MED ORDER — LORAZEPAM 1 MG PO TABS
1.0000 mg | ORAL_TABLET | Freq: Four times a day (QID) | ORAL | Status: AC
  Administered 2023-02-18 – 2023-02-19 (×6): 1 mg via ORAL
  Filled 2023-02-18 (×6): qty 1

## 2023-02-18 MED ORDER — PREGABALIN 75 MG PO CAPS
150.0000 mg | ORAL_CAPSULE | Freq: Three times a day (TID) | ORAL | Status: DC
  Administered 2023-02-18 – 2023-02-20 (×5): 150 mg via ORAL
  Filled 2023-02-18 (×5): qty 2

## 2023-02-18 MED ORDER — LORAZEPAM 1 MG PO TABS: 1.0000 mg | ORAL_TABLET | Freq: Two times a day (BID) | ORAL | Status: DC

## 2023-02-18 MED ORDER — NICOTINE 14 MG/24HR TD PT24
14.0000 mg | MEDICATED_PATCH | Freq: Every day | TRANSDERMAL | Status: DC
  Administered 2023-02-18 – 2023-02-19 (×2): 14 mg via TRANSDERMAL
  Filled 2023-02-18 (×4): qty 1

## 2023-02-18 MED ORDER — ADULT MULTIVITAMIN W/MINERALS CH: 1.0000 | ORAL_TABLET | Freq: Every day | ORAL | Status: DC

## 2023-02-18 MED ORDER — LORAZEPAM 1 MG PO TABS
1.0000 mg | ORAL_TABLET | Freq: Three times a day (TID) | ORAL | Status: DC
  Administered 2023-02-20: 1 mg via ORAL
  Filled 2023-02-18: qty 1

## 2023-02-18 MED ORDER — BACLOFEN 10 MG PO TABS: 10.0000 mg | ORAL_TABLET | Freq: Three times a day (TID) | ORAL | Status: DC

## 2023-02-18 MED ORDER — NICOTINE 14 MG/24HR TD PT24: 14.0000 mg | MEDICATED_PATCH | Freq: Every day | TRANSDERMAL | Status: DC

## 2023-02-18 MED ORDER — HYDROXYZINE HCL 25 MG PO TABS: 25.0000 mg | ORAL_TABLET | Freq: Four times a day (QID) | ORAL | Status: DC | PRN

## 2023-02-18 MED ORDER — ACETAMINOPHEN 325 MG PO TABS: 650.0000 mg | ORAL_TABLET | Freq: Four times a day (QID) | ORAL | Status: DC | PRN

## 2023-02-18 MED ORDER — ALUM & MAG HYDROXIDE-SIMETH 200-200-20 MG/5ML PO SUSP: 30.0000 mL | ORAL | Status: DC | PRN

## 2023-02-18 MED ORDER — ADULT MULTIVITAMIN W/MINERALS CH
1.0000 | ORAL_TABLET | Freq: Every day | ORAL | Status: DC
  Administered 2023-02-18 – 2023-02-20 (×3): 1 via ORAL
  Filled 2023-02-18 (×3): qty 1

## 2023-02-18 MED ORDER — SERTRALINE HCL 100 MG PO TABS: 100.0000 mg | ORAL_TABLET | Freq: Every day | ORAL | Status: DC

## 2023-02-18 MED ORDER — MAGNESIUM HYDROXIDE 400 MG/5ML PO SUSP: 30.0000 mL | Freq: Every day | ORAL | Status: DC | PRN

## 2023-02-18 MED ORDER — TRAZODONE HCL 100 MG PO TABS: 100.0000 mg | ORAL_TABLET | Freq: Every day | ORAL | Status: DC

## 2023-02-18 MED ORDER — ONDANSETRON 4 MG PO TBDP: 4.0000 mg | ORAL_TABLET | Freq: Four times a day (QID) | ORAL | Status: DC | PRN

## 2023-02-18 MED ORDER — THIAMINE MONONITRATE 100 MG PO TABS
100.0000 mg | ORAL_TABLET | Freq: Every day | ORAL | Status: DC
  Administered 2023-02-19 – 2023-02-20 (×2): 100 mg via ORAL
  Filled 2023-02-18 (×2): qty 1

## 2023-02-18 MED ORDER — LORAZEPAM 1 MG PO TABS
1.0000 mg | ORAL_TABLET | Freq: Four times a day (QID) | ORAL | Status: DC | PRN
  Administered 2023-02-19: 1 mg via ORAL
  Filled 2023-02-18: qty 1

## 2023-02-18 MED ORDER — SERTRALINE HCL 100 MG PO TABS
100.0000 mg | ORAL_TABLET | Freq: Every day | ORAL | Status: DC
  Administered 2023-02-19 – 2023-02-20 (×2): 100 mg via ORAL
  Filled 2023-02-18 (×2): qty 1

## 2023-02-18 MED ORDER — PREGABALIN 75 MG PO CAPS: 150.0000 mg | ORAL_CAPSULE | Freq: Three times a day (TID) | ORAL | Status: DC

## 2023-02-18 MED ORDER — ACETAMINOPHEN 325 MG PO TABS
650.0000 mg | ORAL_TABLET | Freq: Four times a day (QID) | ORAL | Status: DC | PRN
  Administered 2023-02-18 – 2023-02-20 (×3): 650 mg via ORAL
  Filled 2023-02-18 (×3): qty 2

## 2023-02-18 NOTE — Telephone Encounter (Signed)
The therapist receives a call from [facility] confirming his identity via two identifiers. The therapist talks to [facility] who is not doing well and encourages him to go to detox as his drinking has been out-of-control.  Vernon Fowler calls again to confirm that he has arrived at the [facility].   [address], MA, LCSW, [facility], LCAS 02/18/2023

## 2023-02-18 NOTE — ED Notes (Signed)
Patient admitted to [facility] from [facility] after being sent down from IOP upstairs by therapist due to patient being intoxicated.  Patient has odor of alcohol with his last drink this morning.  Patient was calm and cooperative with admission process. He was oriented to unit and shown to his room./  Patient was given dinner and something to drink.  He is sitting in dayroom at this time.  No evidence of withdrawal at this time.  Will monitor and provide support as needed.

## 2023-02-18 NOTE — Progress Notes (Signed)
   02/18/23 1416  [facility] Triage Screening (Walk-ins at [facility] only)  How Did You Hear About [place]? Other (Comment) (LCSW at [facility])  What Is the Reason for Your Visit/Call Today? Pt is here requesting detox from alcohol. Denies SI, HI, AVH. ETOH about pint half vodka a day for the past three days.  How Long Has This Been Causing You Problems? 1-6 months  Have You Recently Had Any Thoughts About Hurting Yourself? No  Are You Planning to Commit Suicide/Harm Yourself At This time? No  Have you Recently Had Thoughts About Hurting Someone Karolee Ohs? No  Are You Planning To Harm Someone At This Time? No  Are you currently experiencing any auditory, visual or other hallucinations? No  Have You Used Any Alcohol or Drugs in the Past 24 Hours? No  Do you have any current medical co-morbidities that require immediate attention? No  Clinician description of patient physical appearance/behavior: euthymic  What Do You Feel Would Help You the Most Today? Alcohol or Drug Use Treatment  If access to [facility] was not available, would you have sought care in the Emergency Department? No  Determination of Need Routine (7 days)  Options For Referral Facility-Based Crisis

## 2023-02-18 NOTE — Group Note (Unsigned)
Group Topic: Balance in Life  Group Date: 02/18/2023 Start Time: 0730 End Time: 0910 Facilitators: Rae Lips B  Department: [facility]  Number of Participants: 6  Group Focus: acceptance Treatment Modality:  Exposure Therapy Interventions utilized were leisure development Purpose: increase insight  Name: Vernon Fowler Date of Birth: 08-Jul-1956  MR: [id number]    Level of Participation: active Quality of Participation: attentive Interactions with others: gave feedback Mood/Affect: appropriate Triggers (if applicable): NA Cognition: coherent/clear Progress: Significant Response: NA Plan: patient will be encouraged to keep going to groups.  Patients Problems:  Patient Active Problem List   Diagnosis Date Noted   Alcohol use disorder 02/18/2023   SBO (small bowel obstruction) (HCC) 12/13/2020   Pain of right shoulder joint on movement 03/12/2019   Dupuytren contracture 12/23/2017   Alcohol use disorder, severe, dependence (HCC) 12/09/2017   Hypertension 10/28/2017   GERD (gastroesophageal reflux disease) 10/28/2017   Alcohol abuse 10/28/2017   GAD (generalized anxiety disorder) 10/28/2017   PUD (peptic ulcer disease) 10/01/2011   Thyroid cancer (HCC) 03/31/2011

## 2023-02-18 NOTE — ED Notes (Signed)
Pt in room asleep.  Breathing is unlabored and even. Will continue to monitor for safety.  ?

## 2023-02-18 NOTE — ED Notes (Signed)
Pt is on the hallway talking on the phone.  Respirations are even and unlabored. No acute distress noted. Will continue to monitor for safety

## 2023-02-18 NOTE — BH Assessment (Signed)
Comprehensive Clinical Assessment (CCA) Note  02/18/2023 Vernon Fowler [id number]  Disposition: Per Doran Heater NP, patient is recommended for inpatient treatment.  The patient demonstrates the following risk factors for suicide: Chronic risk factors for suicide include: substance use disorder. Acute risk factors for suicide include: family or marital conflict. Protective factors for this patient include: positive therapeutic relationship, responsibility to others (children, family), and hope for the future. Considering these factors, the overall suicide risk at this point appears to be low. Patient is not appropriate for outpatient follow up.   Chief Complaint:  Chief Complaint  Patient presents with   Alcohol Problem   Detox   Visit Diagnosis: Alcohol use disorder, severe, dependence (HCC)     CCA Screening, Triage and Referral (STR)  Patient Reported Information How did you hear about [place]? Other (Comment) (LCSW at [facility])  What Is the Reason for Your Visit/Call Today? Pt is here requesting detox from alcohol. Denies SI, HI, AVH. ETOH about pint half vodka a day for the past three days. Drunk half pint of vodka at 10am.  Vernon Fowler is a 67 year old male presenting to [facility] voluntarily with chief complaint of alcohol use disorder and he is seeking detox. Patient reports calling his IOP counselor Annette Stable Garrot this morning stating "I'm in trouble" due to consistent drinking for the past three days. Patient reports feeling like he can not stop drinking and his counselor told him to come here today for detox.  Patient reports he received his 90-day sober chip and then got into an argument with his wife which triggered him to drinking. Patient relapsed two weeks ago and reports for the past three days he has been drinking about a pint of vodka a day. Patient reports withdrawal symptoms of shaking, chills and for the past 30 minutes he's had some involuntary flinching. Patient denies history of  seizure during detox and DT's.   Patient has been retired for the past year and half. Patient receives outpatient substance abuse treatment at [facility] and has a diagnosis of Alcohol use disorder, severe, dependence. Patient denies legal issues and does not have access to a firearm.  Patient is oriented x4, engaged, alert and cooperative. Patient eye contact and speech is normal, his mood is appropriate. Patient denies SI, HI, AVH.    How Long Has This Been Causing You Problems? 1-6 months  What Do You Feel Would Help You the Most Today? Alcohol or Drug Use Treatment   Have You Recently Had Any Thoughts About Hurting Yourself? No  Are You Planning to Commit Suicide/Harm Yourself At This time? No   Flowsheet Row ED from 02/18/2023 in [facility] from 11/04/2021 in [facility] at [facility] ED to Hosp-Admission (Discharged) from 12/13/2020 in [facility] [place] [facility]  C-SSRS RISK CATEGORY No Risk No Risk No Risk       Have you Recently Had Thoughts About Hurting Someone Karolee Ohs? No  Are You Planning to Harm Someone at This Time? No  Explanation: No data recorded  Have You Used Any Alcohol or Drugs in the Past 24 Hours? No  What Did You Use and How Much? pint of vodka at 10am   Do You Currently Have a Therapist/Psychiatrist? Yes  Name of Therapist/Psychiatrist: Name of Therapist/Psychiatrist: bill Garrot   Have You Been Recently Discharged From Any Office Practice or Programs? No  Explanation of Discharge From Practice/Program: NA     CCA Screening Triage Referral Assessment Type of Contact:  Face-to-Face  Telemedicine Service Delivery:   Is this Initial or Reassessment?   Date Telepsych consult ordered in [facility]:    Time Telepsych consult ordered in [facility]:    Location of Assessment: [facility] Assessment Services  Provider Location: [facility] Assessment Services   Collateral Involvement: NA   Does Patient Have a  Automotive engineer Guardian? No  Legal Guardian Contact Information: NA  Copy of Legal Guardianship Form: -- (NA)  Legal Guardian Notified of Arrival: -- (NA)  Legal Guardian Notified of Pending Discharge: -- (NA)  If Minor and Not Living with Parent(s), Who has Custody? NA  Is CPS involved or ever been involved? Never  Is APS involved or ever been involved? Never   Patient Determined To Be At Risk for Harm To Self or Others Based on Review of Patient Reported Information or Presenting Complaint? No  Method: No Plan  Availability of Means: No access or NA  Intent: Vague intent or NA  Notification Required: No need or identified person  Additional Information for Danger to Others Potential: No data recorded Additional Comments for Danger to Others Potential: NA  Are There Guns or Other Weapons in Your Home? No  Types of Guns/Weapons: NA  Are These Weapons Safely Secured?                            -- (NA)  Who Could Verify You Are Able To Have These Secured: NA  Do You Have any Outstanding Charges, Pending Court Dates, Parole/Probation? DENIES  Contacted To Inform of Risk of Harm To Self or Others: No data recorded   Does Patient Present under Involuntary Commitment? No    [place] of Residence: [place]   Patient Currently Receiving the Following Services: IOP (Intensive Outpatient Program)   Determination of Need: Routine (7 days)   Options For Referral: Facility-Based Crisis     CCA Biopsychosocial Patient Reported Schizophrenia/Schizoaffective Diagnosis in Past: No   Strengths: He has a supportive family and people say that he is "very generous." He says that he is "altruistic" and does work with Lawyer. He ran 53 marathons so had discipline. He is "very punctual."   Mental Health Symptoms Depression:   None   Duration of Depressive symptoms:    Mania:   None   Anxiety:    Difficulty concentrating; Fatigue;  Irritability; Restlessness; Sleep; Tension; Worrying (sometimes cannot fall asleep; some nights up till 4 a.m. He gets 5-6 hours on these nights but usually get 8-9 hours. He has the sleep problems maybe once a week or so.)   Psychosis:   None   Duration of Psychotic symptoms:    Trauma:   Guilt/shame (car wreck in [place] in which friend was killed)   Obsessions:   None   Compulsions:   None   Inattention:   None   Hyperactivity/Impulsivity:   None   Oppositional/Defiant Behaviors:   None   Emotional Irregularity:   None   Other Mood/Personality Symptoms:  No data recorded   Mental Status Exam Appearance and self-care  Stature:   Average   Weight:   Average weight   Clothing:   Casual   Grooming:   Normal   Cosmetic use:   None   Posture/gait:   Normal   Motor activity:   Not Remarkable   Sensorium  Attention:   Normal   Concentration:   Normal   Orientation:   X5   Recall/memory:  Normal   Affect and Mood  Affect:   Appropriate   Mood:   Anxious   Relating  Eye contact:   None   Facial expression:   Responsive   Attitude toward examiner:   Cooperative   Thought and Language  Speech flow:  Clear and Coherent   Thought content:   Appropriate to Mood and Circumstances   Preoccupation:   None   Hallucinations:   None   Organization:   Coherent   [employer] of Knowledge:   Average   Intelligence:   Above Average   Abstraction:   Abstract   Judgement:   Good   Reality Testing:   Adequate   Insight:   Good   Decision Making:   Normal   Social Functioning  Social Maturity:   Responsible   Social Judgement:   Normal   Stress  Stressors:   Family conflict (sisters and daughters are frustrated about the relapse)   Coping Ability:   Normal   Skill Deficits:   None   Supports:   Family; Water engineer Event organiser, sister, sober friends, and Clinical research associate)      Religion: Religion/Spirituality Are You A Religious Person?: No How Might This Affect Treatment?: Patient has a relationship with his Higher Power through the fellowship of AA  Leisure/Recreation: Leisure / Recreation Do You Have Hobbies?: No  Exercise/Diet: Exercise/Diet Do You Exercise?: Yes Have You Gained or Lost A Significant Amount of Weight in the Past Six Months?: Yes-Gained Do You Follow a Special Diet?: No Do You Have Any Trouble Sleeping?: No   CCA Employment/Education Employment/Work Situation: Employment / Work Academic librarian Situation: Retired Passenger transport manager has Been Impacted by Current Illness: No Has Patient ever Been in Equities trader?: No  Education: Education Is Patient Currently Attending School?: No Last Grade Completed: 16 Did You Product manager?: Yes What Type of College Degree Do you Have?: NA Did You Have An Individualized Education Program (IIEP): No Did You Have Any Difficulty At School?: No Patient's Education Has Been Impacted by Current Illness: No How Does Current Illness Impact Education?: NA   CCA Family/Childhood History Family and Relationship History: Family history Marital status: Married Number of Years Married:  (UNKNOWN) What types of issues is patient dealing with in the relationship?: WIFE HAS ALCOHOLIC ISSUES AS WELL CURRENTLY SOBER Additional relationship information: NA Does patient have children?: Yes How is patient's relationship with their children?: UNKNOWN  Childhood History:  Childhood History By whom was/is the patient raised?: Both parents Did patient suffer any verbal/emotional/physical/sexual abuse as a child?: No Did patient suffer from severe childhood neglect?: No Has patient ever been sexually abused/assaulted/raped as an adolescent or adult?: No Was the patient ever a victim of a crime or a disaster?: No Witnessed domestic violence?: No Has patient been affected by domestic violence as an  adult?: No       CCA Substance Use Alcohol/Drug Use: Alcohol / Drug Use Pain Medications: SEE MAR Prescriptions: SEE MAR Over the Counter: SEE MAR History of alcohol / drug use?: Yes Longest period of sobriety (when/how long): 19 months Negative Consequences of Use: Legal, Personal relationships Withdrawal Symptoms: Sweats, Blackouts, Fever / Chills, Weakness, Tremors Substance #1 Name of Substance 1: ETOH 1 - Age of First Use: 15 1 - Amount (size/oz): PINT 1 - Frequency: DAILY 1 - Duration: ONGOING 1 - Last Use / Amount: 02/18/23 @ 10AM 1 - Method of Aquiring: NA 1- Route of Use: ORAL  ASAM's:  Six Dimensions of Multidimensional Assessment  Dimension 1:  Acute Intoxication and/or Withdrawal Potential:   Dimension 1:  Description of individual's past and current experiences of substance use and withdrawal: reports severe withdrawal during recent detox  Dimension 2:  Biomedical Conditions and Complications:   Dimension 2:  Description of patient's biomedical conditions and  complications: Hypertension, High Cholesterol and problems with hands  Dimension 3:  Emotional, Behavioral, or Cognitive Conditions and Complications:  Dimension 3:  Description of emotional, behavioral, or cognitive conditions and complications: mild anxiety  Dimension 4:  Readiness to Change:  Dimension 4:  Description of Readiness to Change criteria: readiness is "as high as the number can be"  Dimension 5:  Relapse, Continued use, or Continued Problem Potential:  Dimension 5:  Relapse, continued use, or continued problem potential critiera description: has had 7 relapses in the past; he was taking a benzodiazepine for 1.5 years of his sobriety from ETOH and overusing pain medication for 8 months  Dimension 6:  Recovery/Living Environment:  Dimension 6:  Recovery/Iiving environment criteria description: He lives with his domestic partner who is supportive of client's recovery and  has been sober 3.5 years.  ASAM Severity Score: ASAM's Severity Rating Score: 4  ASAM Recommended Level of Treatment: ASAM Recommended Level of Treatment: Level II Intensive Outpatient Treatment   Substance use Disorder (SUD) Substance Use Disorder (SUD)  Checklist Symptoms of Substance Use: Continued use despite having a persistent/recurrent physical/psychological problem caused/exacerbated by use, Continued use despite persistent or recurrent social, interpersonal problems, caused or exacerbated by use, Evidence of tolerance, Evidence of withdrawal (Comment), Large amounts of time spent to obtain, use or recover from the substance(s), Persistent desire or unsuccessful efforts to cut down or control use, Recurrent use that results in a failure to fulfill major role obligations (work, school, home), Repeated use in physically hazardous situations, Social, occupational, recreational activities given up or reduced due to use, Substance(s) often taken in larger amounts or over longer times than was intended  Recommendations for Services/Supports/Treatments: Recommendations for Services/Supports/Treatments Recommendations For Services/Supports/Treatments: CD-IOP Intensive Chemical Dependency Program, Individual Therapy, Detox  Discharge Disposition: Discharge Disposition Medical Exam completed: Yes Disposition of Patient: Admit Mode of transportation if patient is discharged/movement?: Car  DSM5 Diagnoses: Patient Active Problem List   Diagnosis Date Noted   Alcohol use disorder 02/18/2023   SBO (small bowel obstruction) (HCC) 12/13/2020   Pain of right shoulder joint on movement 03/12/2019   Dupuytren contracture 12/23/2017   Alcohol use disorder, severe, dependence (HCC) 12/09/2017   Hypertension 10/28/2017   GERD (gastroesophageal reflux disease) 10/28/2017   Alcohol abuse 10/28/2017   GAD (generalized anxiety disorder) 10/28/2017   PUD (peptic ulcer disease) 10/01/2011   Thyroid cancer  (HCC) 03/31/2011     Referrals to Alternative Service(s): Referred to Alternative Service(s):   Place:   Date:   Time:    Referred to Alternative Service(s):   Place:   Date:   Time:    Referred to Alternative Service(s):   Place:   Date:   Time:    Referred to Alternative Service(s):   Place:   Date:   Time:     Audree Camel, [facility]

## 2023-02-18 NOTE — ED Notes (Signed)
Pt is in the bed sleeping. Respirations are even and unlabored. No acute distress noted. Will continue to monitor for safety. 

## 2023-02-18 NOTE — ED Provider Notes (Signed)
Behavioral Health Urgent Care Medical Screening Exam  Patient Name: Vernon Fowler MRN: [id number] Date of Evaluation: 02/18/23 Chief Complaint:   Diagnosis:  Final diagnoses:  Alcohol use disorder, severe, dependence (HCC)    History of Present illness: Vernon Fowler is a 67 y.o. male. Patient presents voluntarily to [facility] behavioral health for walk-in assessment.  Patient encourage to seek evaluation by outpatient counselor, Alene Mires.   [name] reports chronic alcohol use.  He attended [facility]-day detox called [facility] in March 2024.  90 days sobriety prior to relapse on alcohol 10 days ago.  For 10 days he has consumed 1 pint of vodka and 2 bottles of wine daily.  Last drink 1 PM today when he drank one half of 1 pint of vodka.  He endorses 5 previous residential substance use treatment 30-day stay admissions.  He is uncertain if he would like to transition to residential substance use treatment once discharged from facility based crisis.  [name] is linked with AA and currently has a sponsor.  He attends AA meetings 6 or more times per week.  He also meets with [name], substance use counseling, an average of every other week.  Previous mental health diagnoses include alcohol use disorder and generalized anxiety disorder.  Patient is managed by [name] with [facility].  He is compliant with medications including sertraline, trazodone, pregabalin and gabapentin.  He denies history of inpatient psychiatric hospitalization.  Family mental health and addiction history includes patient's mother and maternal grandmother both diagnosed with alcohol use disorder.  Unable to identify specific trigger.  Recent stressors include patient's oldest daughter who has threatened to "cut herself from my life" related to alcohol use.  Patient is able to identify celebrations including youngest daughter who is expecting twins.  Anniversary  of father's death 2 years ago upcoming.  Patient is assessed, face-to-face, by nurse practitioner. He is seated in assessment area, no acute distress. Consulted with provider, Dr.  Lucianne Muss, and chart reviewed on 02/18/2023. He is alert and oriented, pleasant and cooperative during assessment.   Patient  presents with depressed mood, intermittently tearful affect. He  denies suicidal and homicidal ideations. Denies history of suicide attempts, denies history of non suicidal self-harm behavior.  Patient easily contracts verbally for safety with this Clinical research associate.  Patient has normal speech and behavior.  He  denies auditory and visual hallucinations.  Patient is able to converse coherently with goal-directed thoughts and no distractibility or preoccupation.  Denies symptoms of paranoia.  Objectively there is no evidence of psychosis/mania or delusional thinking.  Vernon resides with partner in [place]. He denies access to weapons. He is retired. Patient endorses average sleep and appetite.  Patient offered support and encouragement.    Flowsheet Row ED from 02/18/2023 in [facility] Counselor from 11/04/2021 in [facility] at [facility] ED to Hosp-Admission (Discharged) from 12/13/2020 in [facility] [place] [facility]  C-SSRS RISK CATEGORY No Risk No Risk No Risk       Psychiatric Specialty Exam  Presentation  General Appearance:Appropriate for Environment; Casual  Eye Contact:Good  Speech:Clear and Coherent; Normal Rate  Speech Volume:Normal  Handedness:Right   Mood and Affect  Mood: Depressed  Affect: Depressed; Tearful   Thought Process  Thought Processes: Coherent; Goal Directed; Linear  Descriptions of Associations:Intact  Orientation:Full (Time, Place and Person)  Thought Content:Logical; WDL  Diagnosis of Schizophrenia or Schizoaffective disorder in past: No data recorded  Hallucinations:None  Ideas of  Reference:None  Suicidal Thoughts:No  Homicidal Thoughts:No   Sensorium  Memory: Immediate Good; Recent Good  Judgment: Good  Insight: Good   Executive Functions  Concentration: Good  Attention Span: Good  Recall: Good  Fund of Knowledge: Good  Language: Good   Psychomotor Activity  Psychomotor Activity: Normal   Assets  Assets: Communication Skills; Desire for Improvement; Financial Resources/Insurance; Housing; Leisure Time; Physical Health; Resilience; Social Support   Sleep  Sleep: Good  Number of hours: No data recorded  Physical Exam: Physical Exam Vitals and nursing note reviewed.  Constitutional:      Appearance: Normal appearance. He is well-developed.  HENT:     Head: Normocephalic and atraumatic.     Nose: Nose normal.  Cardiovascular:     Rate and Rhythm: Normal rate.  Pulmonary:     Effort: Pulmonary effort is normal.  Musculoskeletal:        General: Normal range of motion.     Cervical back: Normal range of motion.  Skin:    General: Skin is warm and dry.  Neurological:     Mental Status: He is alert and oriented to person, place, and time.  Psychiatric:        Attention and Perception: Attention and perception normal.        Mood and Affect: Mood is depressed. Affect is tearful.        Speech: Speech normal.        Behavior: Behavior normal. Behavior is cooperative.        Thought Content: Thought content normal.        Cognition and Memory: Cognition and memory normal.    Review of Systems  Constitutional: Negative.   HENT: Negative.    Eyes: Negative.   Respiratory: Negative.    Cardiovascular: Negative.   Gastrointestinal: Negative.   Genitourinary: Negative.   Musculoskeletal: Negative.   Skin: Negative.   Neurological: Negative.   Psychiatric/Behavioral:  Positive for depression and substance abuse.    Blood pressure (!) 135/93, pulse 77, temperature 98.4 F (36.9 C), temperature source Oral, resp. rate  (!) 77, SpO2 94 %. There is no height or weight on file to calculate BMI.  Musculoskeletal: Strength & Muscle Tone: within normal limits Gait & Station: normal Patient leans: N/A   [facility] MSE Discharge Disposition for Follow up and Recommendations: Based on my evaluation the patient does not appear to have an emergency medical condition and can be discharged with resources and follow up care in outpatient services for facility based crisis unit admission Patient remains voluntary.  He agrees with plan for admission to facility based crisis at [facility] health  Lenard Lance, FNP 02/18/2023, 3:11 PM

## 2023-02-18 NOTE — ED Notes (Signed)
Patient alert and oriented. Pt reports anxious mood. Denies SI, HI, AVH, and pain. Pt reports he is motivated to get treatment for substance abuse.  States he has a daughter who is pregnant with twins and wants to be there for her. Pt also reports concerns about sleeping without CPAP machine.  Staff provided pt with extra pillows to assist with sleep.  Pt reports the extra pillows would be a great assistance. Scheduled medications administered to patient, per MD orders. Support and encouragement provided.  Routine safety checks conducted every 15 minutes.  Patient informed to notify staff with problems or concerns. No adverse drug reactions noted. Patient contracts for safety at this time. Patient compliant with medications and treatment plan. Patient receptive, calm, and cooperative. Patient interacts well with others on the unit.  Patient remains safe at this time.

## 2023-02-18 NOTE — ED Provider Notes (Signed)
Facility Based Crisis Admission H&P  Date: 02/18/23 Patient Name: Vernon Fowler MRN: [id number] Chief Complaint: Alcohol Use Disorder  Diagnoses:  Final diagnoses:  Alcohol use disorder, severe, dependence (HCC)    HPI: History of Present illness: Vernon Fowler is a 67 y.o. male. Patient presents voluntarily to [facility] behavioral health for walk-in assessment.  Patient encourage to seek evaluation by outpatient counselor, Alene Mires.    [name] reports chronic alcohol use.  He attended 7-day detox, [facility],  in March 2024.  90 days sobriety prior to relapse on alcohol 10 days ago.  For 10 days he has consumed approximately 1 pint of vodka and 2 bottles of wine daily.  Last drink 1 PM today when he drank one half of 1 pint of vodka.  He denies history of alcohol-related seizure, denies history of delirium tremens.  He endorses 5 previous residential substance use treatment 30-day stay admissions.  He is uncertain if he would like to transition to residential substance use treatment once discharged from facility based crisis.   [name] is linked with AA and currently has a sponsor.  He attends AA meetings 6 or more times per week.  He also meets with [name], substance use counseling, an average of every other week. Patient is insightful regarding alcohol use, remains committed to his recovery.    Previous mental health diagnoses include alcohol use disorder and generalized anxiety disorder.  Patient is managed by [name] with [facility].  He is compliant with medications including sertraline, trazodone and pregabalin.  He denies history of inpatient psychiatric hospitalization.  Family mental health and addiction history includes patient's mother and maternal grandmother both diagnosed with alcohol use disorder.   Unable to identify specific trigger.  Recent stressors include patient's oldest daughter who has threatened to  "cut herself from my life" related to alcohol use.  Patient is able to identify celebrations including youngest daughter who is expecting twins.  Anniversary of father's death 2 years ago upcoming.   Patient is assessed, face-to-face, by nurse practitioner. He is seated in assessment area, no acute distress. Consulted with provider, Dr.  Lucianne Muss, and chart reviewed on 02/18/2023. He is alert and oriented, pleasant and cooperative during assessment.    Patient  presents with depressed mood, intermittently tearful affect. He  denies suicidal and homicidal ideations. Denies history of suicide attempts, denies history of non suicidal self-harm behavior.  Patient easily contracts verbally for safety with this Clinical research associate.  Patient has normal speech and behavior.  He  denies auditory and visual hallucinations.  Patient is able to converse coherently with goal-directed thoughts and no distractibility or preoccupation.  Denies symptoms of paranoia.  Objectively there is no evidence of psychosis/mania or delusional thinking.   [name] resides with partner in [place]. He denies access to weapons. He is retired. Patient endorses average sleep and appetite.   Patient offered support and encouragement.  He agrees with plan for admission to [facility] facility based crisis.  Discussed medications including lorazepam, reviewed potential side effects and offered patient opportunity to ask questions.  Patient confirms desire to remain full CODE STATUS.     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[facility] at [facility] from 12/09/2017 in [facility] at [facility]  Thoughts that you would be better off dead, or of hurting yourself in some way Not at all Not at all  PHQ-9 Total Score 4 6  Flowsheet Row ED from 02/18/2023 in [facility] Counselor from 11/04/2021 in [facility] at  [facility] ED to Hosp-Admission (Discharged) from 12/13/2020 in [facility] [place] [facility]  C-SSRS RISK CATEGORY No Risk No Risk No Risk         Total Time spent with patient: 30 minutes  Musculoskeletal  Strength & Muscle Tone: within normal limits Gait & Station: normal Patient leans: N/A  Psychiatric Specialty Exam  Presentation General Appearance:  Appropriate for Environment; Casual  Eye Contact: Good  Speech: Clear and Coherent; Normal Rate  Speech Volume: Normal  Handedness: Right   Mood and Affect  Mood: Depressed  Affect: Depressed; Tearful   Thought Process  Thought Processes: Coherent; Goal Directed; Linear  Descriptions of Associations:Intact  Orientation:Full (Time, Place and Person)  Thought Content:Logical; WDL  Diagnosis of Schizophrenia or Schizoaffective disorder in past: No   Hallucinations:Hallucinations: None  Ideas of Reference:None  Suicidal Thoughts:Suicidal Thoughts: No  Homicidal Thoughts:Homicidal Thoughts: No   Sensorium  Memory: Immediate Good; Recent Good  Judgment: Good  Insight: Good   Executive Functions  Concentration: Good  Attention Span: Good  Recall: Good  Fund of Knowledge: Good  Language: Good   Psychomotor Activity  Psychomotor Activity: Psychomotor Activity: Normal   Assets  Assets: Communication Skills; Desire for Improvement; Financial Resources/Insurance; Housing; Leisure Time; Physical Health; Resilience; Social Support   Sleep  Sleep: Sleep: Good   Nutritional Assessment (For OBS and FBC admissions only) Has the patient had a weight loss or gain of 10 pounds or more in the last 3 months?: No Has the patient had a decrease in food intake/or appetite?: No Does the patient have dental problems?: No Does the patient have eating habits or behaviors that may be indicators of an eating disorder including binging or inducing vomiting?: No Has the patient recently lost  weight without trying?: 0 Has the patient been eating poorly because of a decreased appetite?: 0 Malnutrition Screening Tool Score: 0    Physical Exam Vitals and nursing note reviewed.  Constitutional:      Appearance: Normal appearance. He is well-developed and normal weight.  HENT:     Head: Normocephalic and atraumatic.     Nose: Nose normal.  Cardiovascular:     Rate and Rhythm: Normal rate.  Pulmonary:     Effort: Pulmonary effort is normal.  Musculoskeletal:        General: Normal range of motion.     Cervical back: Normal range of motion.  Skin:    General: Skin is warm and dry.  Neurological:     Mental Status: He is alert and oriented to person, place, and time.  Psychiatric:        Attention and Perception: Attention and perception normal.        Mood and Affect: Mood is depressed. Affect is tearful.        Speech: Speech normal.        Behavior: Behavior normal. Behavior is cooperative.        Thought Content: Thought content normal.        Cognition and Memory: Cognition and memory normal.    Review of Systems  Constitutional: Negative.   HENT: Negative.    Eyes: Negative.   Respiratory: Negative.    Cardiovascular: Negative.   Gastrointestinal: Negative.   Genitourinary: Negative.   Musculoskeletal: Negative.   Skin: Negative.   Neurological: Negative.   Psychiatric/Behavioral:  Positive for depression and substance abuse.  Blood pressure (!) 135/93, pulse 77, temperature 98.4 F (36.9 C), temperature source Oral, resp. rate 18, SpO2 94 %. There is no height or weight on file to calculate BMI.  Past Psychiatric History: GAD, alcohol use disorder  Is the patient at risk to self? No  Has the patient been a risk to self in the past 6 months? No .    Has the patient been a risk to self within the distant past? No   Is the patient a risk to others? No   Has the patient been a risk to others in the past 6 months? No   Has the patient been a risk to  others within the distant past? No   Past Medical History: thyroid cancer, GERD, PUD, HTN Family History: mother- alcohol use disorder, maternal grandmother- alcohol use disorder Social History: resides with partner, retired  Last Labs:  Admission on 02/18/2023  Component Date Value Ref Range Status   POC Amphetamine UR 02/18/2023 None Detected  NONE DETECTED (Cut Off Level 1000 ng/mL) Final   POC Secobarbital (BAR) 02/18/2023 None Detected  NONE DETECTED (Cut Off Level 300 ng/mL) Final   POC Buprenorphine (BUP) 02/18/2023 None Detected  NONE DETECTED (Cut Off Level 10 ng/mL) Final   POC Oxazepam (BZO) 02/18/2023 Positive (A)  NONE DETECTED (Cut Off Level 300 ng/mL) Final   POC Cocaine UR 02/18/2023 None Detected  NONE DETECTED (Cut Off Level 300 ng/mL) Final   POC Methamphetamine UR 02/18/2023 None Detected  NONE DETECTED (Cut Off Level 1000 ng/mL) Final   POC Morphine 02/18/2023 None Detected  NONE DETECTED (Cut Off Level 300 ng/mL) Final   POC Methadone UR 02/18/2023 None Detected  NONE DETECTED (Cut Off Level 300 ng/mL) Final   POC Oxycodone UR 02/18/2023 None Detected  NONE DETECTED (Cut Off Level 100 ng/mL) Final   POC Marijuana UR 02/18/2023 None Detected  NONE DETECTED (Cut Off Level 50 ng/mL) Final    Allergies: Sulfa antibiotics  Medications:  Facility Ordered Medications  Medication   [START ON 02/19/2023] sertraline (ZOLOFT) tablet 100 mg   pregabalin (LYRICA) capsule 150 mg   traZODone (DESYREL) tablet 100 mg   baclofen (LIORESAL) tablet 10 mg   [START ON 02/19/2023] pantoprazole (PROTONIX) EC tablet 80 mg   PTA Medications  Medication Sig   carboxymethylcellulose (REFRESH PLUS) 0.5 % SOLN Place 1 drop into both eyes 2 (two) times daily as needed (dry eyes).    Multiple Vitamin (MULTIVITAMIN WITH MINERALS) TABS tablet Take 1 tablet by mouth daily. Men's 50+   b complex vitamins tablet Take 1 tablet by mouth daily.   irbesartan (AVAPRO) 150 MG tablet Take 150 mg by  mouth daily as needed (His BP was dropping really low so his MD told him to take if only if his DBP>90).   levothyroxine (SYNTHROID) 175 MCG tablet Take by mouth.   omeprazole (PRILOSEC) 40 MG capsule TAKE ONE CAPSULE BY MOUTH TWICE A DAY (Patient taking differently: 40 mg 2 (two) times daily as needed (heartburn).)   sertraline (ZOLOFT) 100 MG tablet Take 2 tablets (200 mg total) by mouth daily. (Patient taking differently: Take 100 mg by mouth daily.)   B Complex-Biotin-FA (SUPER QUINTS B-50) TABS Take by mouth.   cholecalciferol (VITAMIN D3) 25 MCG (1000 UNIT) tablet Take by mouth.   thiamine (VITAMIN B1) 100 MG tablet Take 1 tablet by mouth daily.   baclofen (LIORESAL) 10 MG tablet Take 1 tablet (10 mg total) by mouth 3 (three) times daily.  traZODone (DESYREL) 100 MG tablet Take 100 mg by mouth at bedtime.   Omega-3 Fatty Acids (FISH OIL) 1000 MG CAPS Take by mouth.   hydrOXYzine (VISTARIL) 25 MG capsule 25 mg at bedtime as needed for anxiety (sleep).   acamprosate (CAMPRAL) 333 MG tablet Take 2 tablets (666 mg total) by mouth 3 (three) times daily.   pregabalin (LYRICA) 150 MG capsule Take 1 capsule (150 mg total) by mouth 3 (three) times daily. No early refill    Long Term Goals: Improvement in symptoms so as ready for discharge  Short Term Goals: Patient will verbalize feelings in meetings with treatment team members., Patient will attend at least of 50% of the groups daily., Pt will complete the PHQ9 on admission, day 3 and discharge., Patient will participate in completing the [place] Suicide Severity Rating Scale, Patient will score a low risk of violence for 24 hours prior to discharge, and Patient will take medications as prescribed daily.  Medical Decision Making  Voluntary admission to facility based crisis unit at [facility].  Patient considering residential substance use treatment versus continuing to follow with AA daily meetings and sponsorship once  discharged from [facility] behavioral health.  Current medications: -Acetaminophen 650 mg every 6 as needed/mild pain -Maalox 30 mL oral every 4 as needed/digestion -Magnesium hydroxide 30 mL daily as needed/mild constipation -NicoDerm 14 mg transdermal patch daily/nicotine withdrawal  Prior to admission medications initiated: -Baclofen 10 mg 3 times daily -Pantoprazole 80 mg daily -Pregabalin 150 mg 3 times daily -Sertraline 100 mg daily -Trazodone 100 mg nightly   CIWA Ativan protocol initiated: -Loperamide 2 to 4 mg oral as needed/diarrhea or loose stools -Lorazepam 1 mg 4 times daily x4 doses, 1 mg 3 times daily x3 doses, 1 mg 2 times daily x2 doses, 1 mg daily x1 dose -Lorazepam 1 mg every 6 hours as needed CIWA greater than 10 -Multivitamin with minerals 1 tablet daily -Ondansetron disintegrating tablet 4 mg every 6 as needed/nausea or vomiting -Thiamine injection 100 mg IM once -Thiamine tablet 100 mg daily     Recommendations  Based on my evaluation the patient does not appear to have an emergency medical condition.  Lenard Lance, FNP 02/18/23  4:40 PM

## 2023-02-18 NOTE — ED Notes (Signed)
Patient discharged to [facility]

## 2023-02-19 DIAGNOSIS — F1020 Alcohol dependence, uncomplicated: Secondary | ICD-10-CM | POA: Diagnosis not present

## 2023-02-19 MED ORDER — TRAZODONE HCL 50 MG PO TABS
50.0000 mg | ORAL_TABLET | Freq: Every evening | ORAL | Status: DC | PRN
  Administered 2023-02-19: 50 mg via ORAL
  Filled 2023-02-19: qty 1

## 2023-02-19 MED ORDER — HYDROXYZINE HCL 25 MG PO TABS
25.0000 mg | ORAL_TABLET | ORAL | Status: DC | PRN
  Administered 2023-02-19 (×2): 25 mg via ORAL
  Filled 2023-02-19 (×2): qty 1

## 2023-02-19 MED ORDER — NICOTINE POLACRILEX 2 MG MT GUM
2.0000 mg | CHEWING_GUM | Freq: Once | OROMUCOSAL | Status: AC
  Administered 2023-02-19: 2 mg via ORAL
  Filled 2023-02-19: qty 1

## 2023-02-19 MED ORDER — LEVOTHYROXINE SODIUM 75 MCG PO TABS
175.0000 ug | ORAL_TABLET | Freq: Once | ORAL | Status: AC
  Administered 2023-02-19: 175 ug via ORAL
  Filled 2023-02-19: qty 1

## 2023-02-19 NOTE — ED Provider Notes (Cosign Needed Addendum)
Behavioral Health Progress Note  Date and Time: 02/19/2023 10:37 AM Name: Vernon Fowler MRN:  [id number]  Subjective:  " I was sober for the last 19 months, then I had 3 relapses in the past 3 months. "  Kim Lauver 67 year old male that presents after relapsing off of alcohol. Stated drinking a pint of vodka every few days.   States he was attending chemical dependency intensive outpatient programming (CD-IOP), however was advised to follow-up for detox services.  States he has attended multiple outpatient rehabilitation and residential treatment facilities in the past and feels that that is not what he needs at this time.  He reports plans to follow back up with CD-IOP after discharge.  He is denying withdrawal seizures or tremors. Does report " muscle spasms/jerks."  He reports spasms happens intermittently.  He denies any other illicit drug use.  Excell reports strong relationship between he and his daughter.  Reports the passing of his father denied that he has attended grief and loss to help with his symptoms.  Carvel reports he is prescribed Zoloft 100 mg and Lyrica 150 mg p.o. 3 times daily.  Which he reports taking and tolerating well. Patient reported a good appetite.  States he is resting " okay throughout the night. "  Restarted home medication of Synthroid 175 mcg and trazodone 50 mg nightly as needed. Reported CIWA 11 with 2 mg of Ativan given, orders placed for hydroxyzine 25 mg PRN.   Jadd Gasior is sitting; He is alert/oriented x 4; calm/cooperative; and mood congruent with affect.  Patient is speaking in a clear tone at moderate volume, and normal pace; with good eye contact.  His thought process is coherent and relevant; There is no indication that he is currently responding to internal/external stimuli or experiencing delusional thought content.  Patient denies suicidal/self-harm/homicidal ideation, psychosis, and paranoia.  Patient has remained calm throughout assessment and  has answered questions appropriately.   Diagnosis:  Final diagnoses:  Alcohol abuse    Total Time spent with patient: 15 minutes  Past Psychiatric History:  See HPI Past Medical History: See HPI Family History: See HPI Family Psychiatric  History:  See HPI Social History: See HPI  Additional Social History:                         Sleep: Fair  Appetite:  Fair  Current Medications:  Current Facility-Administered Medications  Medication Dose Route Frequency Provider Last Rate Last Admin   acetaminophen (TYLENOL) tablet 650 mg  650 mg Oral Q6H PRN Lenard Lance, FNP   650 mg at 02/18/23 2028   alum & mag hydroxide-simeth (MAALOX/MYLANTA) 200-200-20 MG/5ML suspension 30 mL  30 mL Oral Q4H PRN Lenard Lance, FNP       baclofen (LIORESAL) tablet 10 mg  10 mg Oral TID Lenard Lance, FNP   10 mg at 02/19/23 4098   levothyroxine (SYNTHROID) tablet 175 mcg  175 mcg Oral Once Oneta Rack, NP       loperamide (IMODIUM) capsule 2-4 mg  2-4 mg Oral PRN Lenard Lance, FNP   4 mg at 02/19/23 0923   LORazepam (ATIVAN) tablet 1 mg  1 mg Oral Q6H PRN Lenard Lance, FNP       LORazepam (ATIVAN) tablet 1 mg  1 mg Oral QID Lenard Lance, FNP   1 mg at 02/19/23 0912   Followed by   Melene Muller ON 02/20/2023] LORazepam (ATIVAN) tablet 1  mg  1 mg Oral TID Lenard Lance, FNP       Followed by   Melene Muller ON 02/21/2023] LORazepam (ATIVAN) tablet 1 mg  1 mg Oral BID Lenard Lance, FNP       Followed by   Melene Muller ON 02/22/2023] LORazepam (ATIVAN) tablet 1 mg  1 mg Oral Daily Lenard Lance, FNP       magnesium hydroxide (MILK OF MAGNESIA) suspension 30 mL  30 mL Oral Daily PRN Lenard Lance, FNP       multivitamin with minerals tablet 1 tablet  1 tablet Oral Daily Lenard Lance, FNP   1 tablet at 02/19/23 0911   nicotine (NICODERM CQ - dosed in mg/24 hours) patch 14 mg  14 mg Transdermal Daily Lenard Lance, FNP   14 mg at 02/19/23 0909   ondansetron (ZOFRAN-ODT) disintegrating tablet 4 mg  4 mg Oral  Q6H PRN Lenard Lance, FNP       pantoprazole (PROTONIX) EC tablet 80 mg  80 mg Oral Daily Lenard Lance, FNP   80 mg at 02/19/23 0911   pregabalin (LYRICA) capsule 150 mg  150 mg Oral TID Lenard Lance, FNP   150 mg at 02/19/23 0910   sertraline (ZOLOFT) tablet 100 mg  100 mg Oral Daily Lenard Lance, FNP   100 mg at 02/19/23 4166   thiamine (VITAMIN B1) tablet 100 mg  100 mg Oral Daily Lenard Lance, FNP   100 mg at 02/19/23 0911   traZODone (DESYREL) tablet 50 mg  50 mg Oral QHS,MR X 1 Oneta Rack, NP       Current Outpatient Medications  Medication Sig Dispense Refill   acamprosate (CAMPRAL) 333 MG tablet Take 2 tablets (666 mg total) by mouth 3 (three) times daily. 180 tablet 2   b complex vitamins tablet Take 1 tablet by mouth daily.     B Complex-Biotin-FA (SUPER QUINTS B-50) TABS Take by mouth.     baclofen (LIORESAL) 10 MG tablet Take 1 tablet (10 mg total) by mouth 3 (three) times daily. 270 tablet 0   carboxymethylcellulose (REFRESH PLUS) 0.5 % SOLN Place 1 drop into both eyes 2 (two) times daily as needed (dry eyes).      cholecalciferol (VITAMIN D3) 25 MCG (1000 UNIT) tablet Take by mouth.     hydrOXYzine (VISTARIL) 25 MG capsule 25 mg at bedtime as needed for anxiety (sleep).     irbesartan (AVAPRO) 150 MG tablet Take 150 mg by mouth daily as needed (His BP was dropping really low so his MD told him to take if only if his DBP>90).     levothyroxine (SYNTHROID) 175 MCG tablet Take by mouth.     Multiple Vitamin (MULTIVITAMIN WITH MINERALS) TABS tablet Take 1 tablet by mouth daily. Men's 50+     Omega-3 Fatty Acids (FISH OIL) 1000 MG CAPS Take by mouth.     omeprazole (PRILOSEC) 40 MG capsule TAKE ONE CAPSULE BY MOUTH TWICE A DAY (Patient taking differently: 40 mg 2 (two) times daily as needed (heartburn).) 180 capsule 3   pregabalin (LYRICA) 150 MG capsule Take 1 capsule (150 mg total) by mouth 3 (three) times daily. No early refill 90 capsule 2   rosuvastatin (CRESTOR) 20 MG  tablet Take 20 mg by mouth daily.     sertraline (ZOLOFT) 100 MG tablet Take 2 tablets (200 mg total) by mouth daily. (Patient taking differently: Take 100 mg by mouth daily.)  30 tablet 1   thiamine (VITAMIN B1) 100 MG tablet Take 1 tablet by mouth daily.     traZODone (DESYREL) 100 MG tablet Take 100 mg by mouth at bedtime.     Facility-Administered Medications Ordered in Other Encounters  Medication Dose Route Frequency Provider Last Rate Last Admin   traZODone (DESYREL) tablet 100 mg  100 mg Oral QHS Lenard Lance, FNP        Labs  Lab Results:  Admission on 02/18/2023, Discharged on 02/18/2023  Component Date Value Ref Range Status   POC Amphetamine UR 02/18/2023 None Detected  NONE DETECTED (Cut Off Level 1000 ng/mL) Final   POC Secobarbital (BAR) 02/18/2023 None Detected  NONE DETECTED (Cut Off Level 300 ng/mL) Final   POC Buprenorphine (BUP) 02/18/2023 None Detected  NONE DETECTED (Cut Off Level 10 ng/mL) Final   POC Oxazepam (BZO) 02/18/2023 Positive (A)  NONE DETECTED (Cut Off Level 300 ng/mL) Final   POC Cocaine UR 02/18/2023 None Detected  NONE DETECTED (Cut Off Level 300 ng/mL) Final   POC Methamphetamine UR 02/18/2023 None Detected  NONE DETECTED (Cut Off Level 1000 ng/mL) Final   POC Morphine 02/18/2023 None Detected  NONE DETECTED (Cut Off Level 300 ng/mL) Final   POC Methadone UR 02/18/2023 None Detected  NONE DETECTED (Cut Off Level 300 ng/mL) Final   POC Oxycodone UR 02/18/2023 None Detected  NONE DETECTED (Cut Off Level 100 ng/mL) Final   POC Marijuana UR 02/18/2023 None Detected  NONE DETECTED (Cut Off Level 50 ng/mL) Final    Blood Alcohol level:  Lab Results  Component Value Date   ETH 270 (H) 10/15/2017   ETH 394 (HH) 05/19/2017    Metabolic Disorder Labs: No results found for: "HGBA1C", "MPG" No results found for: "PROLACTIN" No results found for: "CHOL", "TRIG", "HDL", "CHOLHDL", "VLDL", "LDLCALC"  Therapeutic Lab Levels: No results found for:  "LITHIUM" No results found for: "VALPROATE" No results found for: "CBMZ"  Physical Findings   AUDIT    Flowsheet Row Counselor from 12/09/2017 in [facility]  Alcohol Use Disorder Identification Test Final Score (AUDIT) 28      GAD-7    Flowsheet Row Counselor from 10/05/2022 in [facility] at [place] Counselor from 11/04/2021 in [facility] at [place] Counselor from 12/09/2017 in [facility] at [facility]  Total GAD-7 Score 5 5 4       PHQ2-9    Flowsheet Row ED from 02/18/2023 in [facility] Most recent reading at 02/18/2023  5:47 PM ED from 02/18/2023 in [facility] Most recent reading at 02/18/2023  3:27 PM Counselor from 10/05/2022 in [facility] at [place] Most recent reading at 10/05/2022  3:17 PM Counselor from 11/04/2021 in [facility] at [place] Most recent reading at 11/04/2021  1:39 PM Counselor from 12/09/2017 in [facility] at [place] Most recent reading at 12/09/2017 12:50 PM  PHQ-2 Total Score 0 0 0 0 3  PHQ-9 Total Score -- -- 4 -- 6      Flowsheet Row ED from 02/18/2023 in [facility] Most recent reading at 02/18/2023  5:25 PM ED from 02/18/2023 in [facility] Most recent reading at 02/18/2023  2:22 PM Counselor from 11/04/2021 in [facility] at [place] Most recent reading at 11/04/2021  1:40 PM  C-SSRS RISK  CATEGORY No Risk No Risk No Risk        Musculoskeletal  Strength & Muscle Tone: within normal limits Gait & Station: normal Patient leans: N/A  Psychiatric Specialty Exam  Presentation  General Appearance:  Appropriate for Environment  Eye Contact: Good  Speech: Clear and Coherent  Speech  Volume: Normal  Handedness: Right   Mood and Affect  Mood: Anxious  Affect: Congruent   Thought Process  Thought Processes: Coherent  Descriptions of Associations:Intact  Orientation:Full (Time, Place and Person)  Thought Content:Logical  Diagnosis of Schizophrenia or Schizoaffective disorder in past: No    Hallucinations:Hallucinations: None  Ideas of Reference:None  Suicidal Thoughts:Suicidal Thoughts: No  Homicidal Thoughts:Homicidal Thoughts: No   Sensorium  Memory: Recent Good; Remote Good; Immediate Good  Judgment: Good  Insight: Good   Executive Functions  Concentration: Good  Attention Span: Good  Recall: Good  Fund of Knowledge: Good  Language: Good   Psychomotor Activity  Psychomotor Activity: Psychomotor Activity: Normal   Assets  Assets: Desire for Improvement; Social Support   Sleep  Sleep: Sleep: Fair   Nutritional Assessment (For OBS and FBC admissions only) Has the patient had a weight loss or gain of 10 pounds or more in the last 3 months?: No Has the patient had a decrease in food intake/or appetite?: No Does the patient have dental problems?: No Does the patient have eating habits or behaviors that may be indicators of an eating disorder including binging or inducing vomiting?: No Has the patient recently lost weight without trying?: 0 Has the patient been eating poorly because of a decreased appetite?: 0 Malnutrition Screening Tool Score: 0    Physical Exam  Physical Exam Vitals and nursing note reviewed.  Constitutional:      Appearance: Normal appearance.  Psychiatric:        Mood and Affect: Mood normal.        Behavior: Behavior normal.    Review of Systems  Psychiatric/Behavioral:  Positive for depression and substance abuse. Negative for suicidal ideas. The patient is nervous/anxious.   All other systems reviewed and are negative.  Blood pressure (!) 123/92, pulse 72, temperature 98.1 F  (36.7 C), temperature source Tympanic, resp. rate 18, SpO2 99 %. There is no height or weight on file to calculate BMI.  Treatment Plan Summary: Daily contact with patient to assess and evaluate symptoms and progress in treatment and Medication management  Continue with current treatment plan on 02/19/2023 as listed below except were noted  Alcohol abuse: Substance-induced mood disorder: Major depressive disorder:  Continue Ativan taper/CIWA monitoring Continue Zoloft 100 mg p.o. daily Continue trazodone 50 mg p.o. nightly as needed times 1 repeat Continue NicoDerm 14 mg change daily Continue Lyrica 150 mg p.o. 3 times daily -Restarted Synthroid 175 mcg for hypothyroidism- (home medication)  Patient encouraged to participate in the therapeutic milieu Continue current medications as directed CSW to follow-up with discharge disposition    Oneta Rack, NP 02/19/2023 10:37 AM

## 2023-02-19 NOTE — Group Note (Signed)
Group Topic: Wellness  Group Date: 02/19/2023 Start Time: 1230 End Time: 1350 Facilitators: Vonzell Schlatter B  Department: [facility]  Number of Participants: 6  Group Focus: self-awareness Treatment Modality:  Psychoeducation Interventions utilized were support Purpose: reinforce self-care  Name: Vernon Fowler Date of Birth: November 25, 1955  MR: [id number]    Level of Participation: active Quality of Participation: attentive and cooperative Interactions with others: gave feedback Mood/Affect: positive Triggers (if applicable): n/a Cognition: coherent/clear Progress: Moderate Response: n/a Plan: follow-up needed  Patients Problems:  Patient Active Problem List   Diagnosis Date Noted   Alcohol use disorder 02/18/2023   SBO (small bowel obstruction) (HCC) 12/13/2020   Pain of right shoulder joint on movement 03/12/2019   Dupuytren contracture 12/23/2017   Alcohol use disorder, severe, dependence (HCC) 12/09/2017   Hypertension 10/28/2017   GERD (gastroesophageal reflux disease) 10/28/2017   Alcohol abuse 10/28/2017   GAD (generalized anxiety disorder) 10/28/2017   PUD (peptic ulcer disease) 10/01/2011   Thyroid cancer (HCC) 03/31/2011

## 2023-02-19 NOTE — ED Notes (Signed)
Pt is in the bed sleeping. Respirations are even and unlabored. No acute distress noted. Will continue to monitor for safety. 

## 2023-02-19 NOTE — ED Notes (Signed)
Patient was awake early observed in the dayroom drinking coffee.  He is calm, pleasant and organized.  No evidence of etoh withdrawal at this time.  Tolerating ativan taper.  Will monitor and provide support as needed.

## 2023-02-19 NOTE — ED Notes (Signed)
A/O, pleasant and cooperative. Pt denies SI/HI/AVH. States that his withdrawal symptoms have improved but he is still has aches. No noted distress. Will continue to monitor for safety

## 2023-02-19 NOTE — Group Note (Signed)
Group Topic: Relapse and Recovery  Group Date: 02/19/2023 Start Time: 2000 End Time: 2100 Facilitators: AA Department: [facility]  Number of Participants: 5 Group Focus: abuse issues Treatment Modality:  Patient-Centered Therapy Interventions utilized were group exercise Purpose: relapse prevention strategies and trigger / craving management  Name: Vernon Fowler Date of Birth: 08-Oct-1955  MR: [id number]    Level of Participation: active Quality of Participation: attention seeking and cooperative Interactions with others: gave feedback Mood/Affect: appropriate Triggers (if applicable): People places and things Cognition: coherent/clear Progress: Significant Response: In agreement with treatment plan Plan: referral / recommendations  Patients Problems:  Patient Active Problem List   Diagnosis Date Noted   Alcohol use disorder 02/18/2023   SBO (small bowel obstruction) (HCC) 12/13/2020   Pain of right shoulder joint on movement 03/12/2019   Dupuytren contracture 12/23/2017   Alcohol use disorder, severe, dependence (HCC) 12/09/2017   Hypertension 10/28/2017   GERD (gastroesophageal reflux disease) 10/28/2017   Alcohol abuse 10/28/2017   GAD (generalized anxiety disorder) 10/28/2017   PUD (peptic ulcer disease) 10/01/2011   Thyroid cancer (HCC) 03/31/2011

## 2023-02-19 NOTE — ED Notes (Signed)
Patient encouraged to drink water instead of coffee which he has been drinking copious amounts of.  He complained of diarrhea and was given imodium.  Will monitor.

## 2023-02-19 NOTE — ED Notes (Signed)
Pt is currently sleeping, no distress noted, environmental check complete, will continue to monitor patient for safety.  

## 2023-02-19 NOTE — ED Notes (Signed)
Patient asked to get numbers out of his locker from a spiral bound notebook that her

## 2023-02-19 NOTE — Group Note (Signed)
Group Topic: Overcoming Obstacles  Group Date: 02/19/2023 Start Time: 1600 End Time: 1620 Facilitators: Jenean Lindau, RN  Department: [facility]  Number of Participants: 4  Group Focus: chemical dependency issues Treatment Modality:  Patient-Centered Therapy Interventions utilized were patient education Purpose: express feelings and regain self-worth  Name: Vernon Fowler Date of Birth: 1956-02-16  MR: [id number]    Level of Participation: active Quality of Participation: attention seeking, cooperative, and hyperactive Interactions with others: gave feedback Mood/Affect: anxious Triggers (if applicable):   Cognition: concrete Progress: Gaining insight Response:   Plan: follow-up needed  Patients Problems:  Patient Active Problem List   Diagnosis Date Noted   Alcohol use disorder 02/18/2023   SBO (small bowel obstruction) (HCC) 12/13/2020   Pain of right shoulder joint on movement 03/12/2019   Dupuytren contracture 12/23/2017   Alcohol use disorder, severe, dependence (HCC) 12/09/2017   Hypertension 10/28/2017   GERD (gastroesophageal reflux disease) 10/28/2017   Alcohol abuse 10/28/2017   GAD (generalized anxiety disorder) 10/28/2017   PUD (peptic ulcer disease) 10/01/2011   Thyroid cancer (HCC) 03/31/2011

## 2023-02-19 NOTE — ED Notes (Signed)
Patient having breakthrough withdrawal with extreme tremors, headache, nausea and anxiety with CIWA 11.  Patient given a PO PRN dose of ativan 1mg .  Patient also given gatorade and encouraged to increase PO fluid including water.  Will monitor closely.

## 2023-02-19 NOTE — ED Notes (Signed)
Patient was escorted to his locker to get numbers out of a spiral bound book which he was told he could not have on unit.  The tech took him to the locker and he brought the book back to the unit.  He was again told it was not allowed and RN brought the book back to the locker.  Shortly afterward RN observed that patient was carrying something in his hand and when patient was confronted it turned out to be a vape he took from locker but patient stated it was in his pants.  Vape taken from patient and he was again reminded of unit rules .  RN went and placed vape back in the locker.  RN returned to unit and patient apologized.  He then told RN that he also had a phone in his room he took from locker.  RN then went to room and took the phone, searched the room and again reviewed the unit rules on contraband.  Phone taken back to the locker and secured.  Patient then stated he wanted to be discharged because he was ashamed.  RN sat with patient and talked about the event with him.  He was calmed and decided to stay.  Patient was encouraged to do some step work which he is familiar with however he declined.  Patient now resting in room quietly.  Will monitor and provide safe environment.

## 2023-02-20 DIAGNOSIS — F1010 Alcohol abuse, uncomplicated: Secondary | ICD-10-CM

## 2023-02-20 DIAGNOSIS — F1020 Alcohol dependence, uncomplicated: Secondary | ICD-10-CM | POA: Diagnosis not present

## 2023-02-20 MED ORDER — LEVOTHYROXINE SODIUM 75 MCG PO TABS
175.0000 ug | ORAL_TABLET | Freq: Every day | ORAL | Status: DC
  Administered 2023-02-20: 175 ug via ORAL
  Filled 2023-02-20: qty 1

## 2023-02-20 MED ORDER — NICOTINE POLACRILEX 2 MG MT GUM
2.0000 mg | CHEWING_GUM | OROMUCOSAL | Status: DC
  Administered 2023-02-20: 2 mg via ORAL
  Filled 2023-02-20: qty 1

## 2023-02-20 NOTE — ED Notes (Signed)
Patient refused patch would like nicotine gum instead

## 2023-02-20 NOTE — ED Provider Notes (Cosign Needed Addendum)
FBC/OBS ASAP Discharge Summary  Date and Time: 02/20/2023 12:06 PM  Name: Vernon Fowler  MRN:  [id number]   Discharge Diagnoses:  Final diagnoses:  Alcohol abuse   [name] stated " I may need to be discharge, because my daughter is having a baby."   Evaluation: on 02/20/2023 at 9:00am  [name] was seen and evaluated face-to-face by this provider. Patient is standing at the medication window; he is alert/oriented x 4; calm/cooperative; and mood congruent with affect. He presents with a bright and pleasant affect.     Continues to deny suicidal or homicidal ideations. speaking in a clear tone at moderate volume, and normal pace; with good eye contact. Denies auditory or visual hallucinations. There is no indication that he is currently responding to internal/external stimuli or experiencing delusional thought content   Stated that his "jerking/spasms" are a lot milder today.  Denied cravings or any other withdrawal symptoms.  Charted history of hypertension last BP 141/99 pulse 52.  Patient encouraged to increase fluids.  Denies depression or depressive symptoms.  Akeen is on day 3 of Ativan taper. Support, encouragement  and reassurance was provided. Patient has remained calm throughout assessment and has answered questions appropriately."  Patient requested to discharge at 12:15  Vernon Fowler was admitted for Alcohol use disorder , with psychosis and crisis management.  Pt was treated discharged with the medications listed below under Medication List.  Medical problems were identified and treated as needed.  Home medications were restarted as appropriate.  Improvement was monitored by observation and Vernon Fowler 's daily report of symptom reduction.  Emotional and mental status was monitored by daily self-inventory reports completed by Vernon Fowler and clinical staff.         Vernon Fowler was evaluated by the treatment team for stability and plans for continued recovery upon  discharge. Vernon Fowler 's motivation was an integral factor for scheduling further treatment. Employment, transportation, bed availability, health status, family support, and any pending legal issues were also considered during hospital stay. Pt was offered further treatment options upon discharge including but not limited to Residential, Intensive Outpatient, and Outpatient treatment.  Vernon Fowler will follow up with the services as listed below under Follow Up Information.     Upon completion of this admission the patient was both mentally and medically stable for discharge denying suicidal/homicidal ideation, auditory/visual/tactile hallucinations, delusional thoughts and paranoia.     Total Time spent with patient: 15 minutes   Tobacco Cessation:  A prescription for an FDA-approved tobacco cessation medication provided at discharge  Current Medications:  Current Facility-Administered Medications  Medication Dose Route Frequency Provider Last Rate Last Admin   acetaminophen (TYLENOL) tablet 650 mg  650 mg Oral Q6H PRN Lenard Lance, FNP   650 mg at 02/20/23 0750   alum & mag hydroxide-simeth (MAALOX/MYLANTA) 200-200-20 MG/5ML suspension 30 mL  30 mL Oral Q4H PRN Lenard Lance, FNP       baclofen (LIORESAL) tablet 10 mg  10 mg Oral TID Lenard Lance, FNP   10 mg at 02/20/23 4010   hydrOXYzine (ATARAX) tablet 25 mg  25 mg Oral Q4H PRN Oneta Rack, NP   25 mg at 02/19/23 2118   levothyroxine (SYNTHROID) tablet 175 mcg  175 mcg Oral Q0600 [place], Ene A, NP   175 mcg at 02/20/23 2725   loperamide (IMODIUM) capsule 2-4 mg  2-4 mg Oral PRN Lenard Lance, FNP   4 mg at 02/19/23 [phone]  LORazepam (ATIVAN) tablet 1 mg  1 mg Oral Q6H PRN Lenard Lance, FNP   1 mg at 02/19/23 1058   LORazepam (ATIVAN) tablet 1 mg  1 mg Oral TID Lenard Lance, FNP   1 mg at 02/20/23 1610   Followed by   Melene Muller ON 02/21/2023] LORazepam (ATIVAN) tablet 1 mg  1 mg Oral BID Lenard Lance, FNP       Followed by    Melene Muller ON 02/22/2023] LORazepam (ATIVAN) tablet 1 mg  1 mg Oral Daily Lenard Lance, FNP       magnesium hydroxide (MILK OF MAGNESIA) suspension 30 mL  30 mL Oral Daily PRN Lenard Lance, FNP       multivitamin with minerals tablet 1 tablet  1 tablet Oral Daily Lenard Lance, FNP   1 tablet at 02/20/23 9604   nicotine polacrilex (NICORETTE) gum 2 mg  2 mg Oral Q4H while awake Oneta Rack, NP   2 mg at 02/20/23 1042   ondansetron (ZOFRAN-ODT) disintegrating tablet 4 mg  4 mg Oral Q6H PRN Lenard Lance, FNP       pantoprazole (PROTONIX) EC tablet 80 mg  80 mg Oral Daily Lenard Lance, FNP   80 mg at 02/20/23 5409   pregabalin (LYRICA) capsule 150 mg  150 mg Oral TID Lenard Lance, FNP   150 mg at 02/20/23 8119   sertraline (ZOLOFT) tablet 100 mg  100 mg Oral Daily Lenard Lance, FNP   100 mg at 02/20/23 1478   thiamine (VITAMIN B1) tablet 100 mg  100 mg Oral Daily Lenard Lance, FNP   100 mg at 02/20/23 2956   traZODone (DESYREL) tablet 50 mg  50 mg Oral QHS,MR X 1 Oneta Rack, NP   50 mg at 02/19/23 2329   Current Outpatient Medications  Medication Sig Dispense Refill   acamprosate (CAMPRAL) 333 MG tablet Take 2 tablets (666 mg total) by mouth 3 (three) times daily. 180 tablet 2   b complex vitamins tablet Take 1 tablet by mouth daily.     B Complex-Biotin-FA (SUPER QUINTS B-50) TABS Take by mouth.     baclofen (LIORESAL) 10 MG tablet Take 1 tablet (10 mg total) by mouth 3 (three) times daily. 270 tablet 0   carboxymethylcellulose (REFRESH PLUS) 0.5 % SOLN Place 1 drop into both eyes 2 (two) times daily as needed (dry eyes).      cholecalciferol (VITAMIN D3) 25 MCG (1000 UNIT) tablet Take by mouth.     hydrOXYzine (VISTARIL) 25 MG capsule 25 mg at bedtime as needed for anxiety (sleep).     irbesartan (AVAPRO) 150 MG tablet Take 150 mg by mouth daily as needed (His BP was dropping really low so his MD told him to take if only if his DBP>90).     levothyroxine (SYNTHROID) 175 MCG tablet Take  by mouth.     Multiple Vitamin (MULTIVITAMIN WITH MINERALS) TABS tablet Take 1 tablet by mouth daily. Men's 50+     Omega-3 Fatty Acids (FISH OIL) 1000 MG CAPS Take by mouth.     omeprazole (PRILOSEC) 40 MG capsule TAKE ONE CAPSULE BY MOUTH TWICE A DAY (Patient taking differently: 40 mg 2 (two) times daily as needed (heartburn).) 180 capsule 3   pregabalin (LYRICA) 150 MG capsule Take 1 capsule (150 mg total) by mouth 3 (three) times daily. No early refill 90 capsule 2   rosuvastatin (CRESTOR) 20 MG tablet Take 20  mg by mouth daily.     sertraline (ZOLOFT) 100 MG tablet Take 2 tablets (200 mg total) by mouth daily. (Patient taking differently: Take 100 mg by mouth daily.) 30 tablet 1   thiamine (VITAMIN B1) 100 MG tablet Take 1 tablet by mouth daily.     traZODone (DESYREL) 100 MG tablet Take 100 mg by mouth at bedtime.     Facility-Administered Medications Ordered in Other Encounters  Medication Dose Route Frequency Provider Last Rate Last Admin   traZODone (DESYREL) tablet 100 mg  100 mg Oral QHS Lenard Lance, FNP        PTA Medications:  Facility Ordered Medications  Medication   traZODone (DESYREL) tablet 100 mg   acetaminophen (TYLENOL) tablet 650 mg   alum & mag hydroxide-simeth (MAALOX/MYLANTA) 200-200-20 MG/5ML suspension 30 mL   magnesium hydroxide (MILK OF MAGNESIA) suspension 30 mL   [COMPLETED] thiamine (VITAMIN B1) injection 100 mg   thiamine (VITAMIN B1) tablet 100 mg   multivitamin with minerals tablet 1 tablet   LORazepam (ATIVAN) tablet 1 mg   loperamide (IMODIUM) capsule 2-4 mg   ondansetron (ZOFRAN-ODT) disintegrating tablet 4 mg   [COMPLETED] LORazepam (ATIVAN) tablet 1 mg   Followed by   LORazepam (ATIVAN) tablet 1 mg   Followed by   Melene Muller ON 02/21/2023] LORazepam (ATIVAN) tablet 1 mg   Followed by   Melene Muller ON 02/22/2023] LORazepam (ATIVAN) tablet 1 mg   baclofen (LIORESAL) tablet 10 mg   pantoprazole (PROTONIX) EC tablet 80 mg   sertraline (ZOLOFT) tablet  100 mg   pregabalin (LYRICA) capsule 150 mg   traZODone (DESYREL) tablet 50 mg   [COMPLETED] levothyroxine (SYNTHROID) tablet 175 mcg   hydrOXYzine (ATARAX) tablet 25 mg   [COMPLETED] nicotine polacrilex (NICORETTE) gum 2 mg   levothyroxine (SYNTHROID) tablet 175 mcg   nicotine polacrilex (NICORETTE) gum 2 mg   PTA Medications  Medication Sig   carboxymethylcellulose (REFRESH PLUS) 0.5 % SOLN Place 1 drop into both eyes 2 (two) times daily as needed (dry eyes).    Multiple Vitamin (MULTIVITAMIN WITH MINERALS) TABS tablet Take 1 tablet by mouth daily. Men's 50+   b complex vitamins tablet Take 1 tablet by mouth daily.   irbesartan (AVAPRO) 150 MG tablet Take 150 mg by mouth daily as needed (His BP was dropping really low so his MD told him to take if only if his DBP>90).   levothyroxine (SYNTHROID) 175 MCG tablet Take by mouth.   omeprazole (PRILOSEC) 40 MG capsule TAKE ONE CAPSULE BY MOUTH TWICE A DAY (Patient taking differently: 40 mg 2 (two) times daily as needed (heartburn).)   sertraline (ZOLOFT) 100 MG tablet Take 2 tablets (200 mg total) by mouth daily. (Patient taking differently: Take 100 mg by mouth daily.)   B Complex-Biotin-FA (SUPER QUINTS B-50) TABS Take by mouth.   cholecalciferol (VITAMIN D3) 25 MCG (1000 UNIT) tablet Take by mouth.   thiamine (VITAMIN B1) 100 MG tablet Take 1 tablet by mouth daily.   baclofen (LIORESAL) 10 MG tablet Take 1 tablet (10 mg total) by mouth 3 (three) times daily.   traZODone (DESYREL) 100 MG tablet Take 100 mg by mouth at bedtime.   Omega-3 Fatty Acids (FISH OIL) 1000 MG CAPS Take by mouth.   hydrOXYzine (VISTARIL) 25 MG capsule 25 mg at bedtime as needed for anxiety (sleep).   acamprosate (CAMPRAL) 333 MG tablet Take 2 tablets (666 mg total) by mouth 3 (three) times daily.   pregabalin (LYRICA) 150 MG  capsule Take 1 capsule (150 mg total) by mouth 3 (three) times daily. No early refill       02/20/2023   12:04 PM 02/18/2023    5:47 PM  02/18/2023    3:52 PM  Depression screen PHQ 2/9  Decreased Interest 0 0 0  Down, Depressed, Hopeless 0 0 0  PHQ - 2 Score 0 0 0    Flowsheet Row ED [facility]/18/2023 in [facility] Most recent reading at 02/18/2023  5:25 PM ED from 02/18/2023 in [facility] Most recent reading at 02/18/2023  2:22 PM Counselor from 11/04/2021 in [facility] [place] Most recent reading at 11/04/2021  1:40 PM  C-SSRS RISK CATEGORY No Risk No Risk No Risk       Musculoskeletal  Strength & Muscle Tone: within normal limits Gait & Station: normal Patient leans: N/A  Psychiatric Specialty Exam  Presentation  General Appearance:  Appropriate for Environment  Eye Contact: Good  Speech: Clear and Coherent  Speech Volume: Normal  Handedness: Right   Mood and Affect  Mood: Anxious  Affect: Congruent   Thought Process  Thought Processes: Coherent  Descriptions of Associations:Intact  Orientation:Full (Time, Place and Person)  Thought Content:Logical  Diagnosis of Schizophrenia or Schizoaffective disorder in past: No    Hallucinations:Hallucinations: None  Ideas of Reference:None  Suicidal Thoughts:Suicidal Thoughts: No  Homicidal Thoughts:Homicidal Thoughts: No   Sensorium  Memory: Immediate Good; Recent Good; Remote Good  Judgment: Good  Insight: Good   Executive Functions  Concentration: Fair  Attention Span: Good  Recall: Good  Fund of Knowledge: Fair  Language: Good   Psychomotor Activity  Psychomotor Activity: Psychomotor Activity: Normal   Assets  Assets: Desire for Improvement   Sleep  Sleep: Sleep: Fair   Nutritional Assessment (For OBS and FBC admissions only) Has the patient had a weight loss or gain of 10 pounds or more in the last 3 months?: No Has the patient had a decrease in food intake/or appetite?: No Does the patient have dental  problems?: No Does the patient have eating habits or behaviors that may be indicators of an eating disorder including binging or inducing vomiting?: No Has the patient recently lost weight without trying?: 0 Has the patient been eating poorly because of a decreased appetite?: 0 Malnutrition Screening Tool Score: 0    Physical Exam  Physical Exam Vitals and nursing note reviewed.  Cardiovascular:     Pulses: Normal pulses.  Neurological:     Mental Status: He is alert and oriented to person, place, and time.  Psychiatric:        Mood and Affect: Mood normal.        Behavior: Behavior normal.    Review of Systems  Cardiovascular: Negative.   Psychiatric/Behavioral:  Positive for depression. The patient is nervous/anxious.   All other systems reviewed and are negative.  Blood pressure (!) 141/99, pulse (!) 52, temperature 97.8 F (36.6 C), temperature source Oral, resp. rate 16, SpO2 98 %. There is no height or weight on file to calculate BMI.  Demographic Factors:  Male  Loss Factors: NA  Historical Factors: Family history of mental illness or substance abuse  Risk Reduction Factors:   Positive social support and Positive therapeutic relationship  Continued Clinical Symptoms:  Alcohol/Substance Abuse/Dependencies  Cognitive Features That Contribute To Risk:  Closed-mindedness    Suicide Risk:  Minimal: No identifiable suicidal ideation.  Patients presenting with no risk factors  but with morbid ruminations; may be classified as minimal risk based on the severity of the depressive symptoms  Plan Of Care/Follow-up recommendations:  Activity:  as tolerated Diet:  heart healthy   Disposition: Take all of you medications as prescribed by your mental healthcare provider.  Report any adverse effects and reactions from your medications to your outpatient provider promptly.  Do not engage in alcohol and or illegal drug use while on prescription medicines. Keep all scheduled  appointments. This is to ensure that you are getting refills on time and to avoid any interruption in your medication.  If you are unable to keep an appointment call to reschedule.  Be sure to follow up with resources and follow ups given. In the event of worsening symptoms call the crisis hotline, 911, and or go to the nearest emergency department for appropriate evaluation and treatment of symptoms. Follow-up with your primary care provider for your medical issues, concerns and or health care needs.    Oneta Rack, NP 02/20/2023, 12:06 PM

## 2023-02-20 NOTE — ED Notes (Signed)
Patient alert and oriented x 3. Denies SI/HI/AVH. Denies intent or plan to harm self or others. Routine conducted according to faculty protocol. Encourage patient to notify staff with any needs or concerns. Patient verbalized agreement and understanding. Will continue to monitor for safety. 

## 2023-02-20 NOTE — Group Note (Signed)
Group Topic: Communication  Group Date: 02/20/2023 Start Time: 1100 End Time: 1115 Facilitators: Merrie Roof, RN  Department: [facility]  Number of Participants: 6  Group Focus: safety plan Treatment Modality:  Behavior Modification Therapy Interventions utilized were assignment Purpose: enhance coping skills  Name: Jeoffrey Eleazer Date of Birth: 1956/03/25  MR: [id number]    Level of Participation: active Quality of Participation: attentive Interactions with others: gave feedback Mood/Affect: appropriate Triggers (if applicable):  Cognition: coherent/clear Progress: Significant Response:  Plan: patient will be encouraged to continue with therapy.  Patients Problems:  Patient Active Problem List   Diagnosis Date Noted   Alcohol use disorder 02/18/2023   SBO (small bowel obstruction) (HCC) 12/13/2020   Pain of right shoulder joint on movement 03/12/2019   Dupuytren contracture 12/23/2017   Alcohol use disorder, severe, dependence (HCC) 12/09/2017   Hypertension 10/28/2017   GERD (gastroesophageal reflux disease) 10/28/2017   Alcohol abuse 10/28/2017   GAD (generalized anxiety disorder) 10/28/2017   PUD (peptic ulcer disease) 10/01/2011   Thyroid cancer (HCC) 03/31/2011

## 2023-02-20 NOTE — ED Provider Notes (Signed)
Behavioral Health Progress Note  Date and Time: 02/20/2023 9:54 AM Name: Vernon Fowler MRN:  [id number]  Subjective:  Vernon Fowler stated " I may need to be discharge, because my daughter is having a baby."  Evaluation: Vernon Fowler was seen and evaluated face-to-face by this provider. Patient is standing at the medication window; he is alert/oriented x 4; calm/cooperative; and mood congruent with affect. He presents with a bright and pleasant affect.    Continues to deny suicidal or homicidal ideations. speaking in a clear tone at moderate volume, and normal pace; with good eye contact. Denies auditory or visual hallucinations. There is no indication that he is currently responding to internal/external stimuli or experiencing delusional thought content  Stated that his "jerking/spasms" are a lot milder today.  Denied cravings or any other withdrawal symptoms.  Charted history of hypertension last BP 141/99 pulse 52.  Patient encouraged to increase fluids.  Denies depression or depressive symptoms.  Vernon Fowler is on day 3 of Ativan taper. staff to continue to monitor for safety. Support, encouragement  and reassurance was provided. Patient has remained calm throughout assessment and has answered questions appropriately.       Diagnosis:  Final diagnoses:  Alcohol abuse    Total Time spent with patient: 15 minutes  Past Psychiatric History:  See HPI Past Medical History: See HPI Family History: See HPI Family Psychiatric  History: See HPI Social History: See HPI  Additional Social History:         Sleep: Fair  Appetite:  Negative  Current Medications:  Current Facility-Administered Medications  Medication Dose Route Frequency Provider Last Rate Last Admin   acetaminophen (TYLENOL) tablet 650 mg  650 mg Oral Q6H PRN Lenard Lance, FNP   650 mg at 02/20/23 0750   alum & mag hydroxide-simeth (MAALOX/MYLANTA) 200-200-20 MG/5ML suspension 30 mL  30 mL Oral Q4H PRN Lenard Lance, FNP        baclofen (LIORESAL) tablet 10 mg  10 mg Oral TID Lenard Lance, FNP   10 mg at 02/20/23 0907   hydrOXYzine (ATARAX) tablet 25 mg  25 mg Oral Q4H PRN Oneta Rack, NP   25 mg at 02/19/23 2118   levothyroxine (SYNTHROID) tablet 175 mcg  175 mcg Oral Q0600 [place], Ene A, NP   175 mcg at 02/20/23 2952   loperamide (IMODIUM) capsule 2-4 mg  2-4 mg Oral PRN Lenard Lance, FNP   4 mg at 02/19/23 8413   LORazepam (ATIVAN) tablet 1 mg  1 mg Oral Q6H PRN Lenard Lance, FNP   1 mg at 02/19/23 1058   LORazepam (ATIVAN) tablet 1 mg  1 mg Oral TID Lenard Lance, FNP   1 mg at 02/20/23 2440   Followed by   Melene Muller ON 02/21/2023] LORazepam (ATIVAN) tablet 1 mg  1 mg Oral BID Lenard Lance, FNP       Followed by   Melene Muller ON 02/22/2023] LORazepam (ATIVAN) tablet 1 mg  1 mg Oral Daily Lenard Lance, FNP       magnesium hydroxide (MILK OF MAGNESIA) suspension 30 mL  30 mL Oral Daily PRN Lenard Lance, FNP       multivitamin with minerals tablet 1 tablet  1 tablet Oral Daily Lenard Lance, FNP   1 tablet at 02/20/23 1027   nicotine polacrilex (NICORETTE) gum 2 mg  2 mg Oral Q4H while awake Oneta Rack, NP       ondansetron (ZOFRAN-ODT)  disintegrating tablet 4 mg  4 mg Oral Q6H PRN Lenard Lance, FNP       pantoprazole (PROTONIX) EC tablet 80 mg  80 mg Oral Daily Lenard Lance, FNP   80 mg at 02/20/23 0907   pregabalin (LYRICA) capsule 150 mg  150 mg Oral TID Lenard Lance, FNP   150 mg at 02/20/23 1610   sertraline (ZOLOFT) tablet 100 mg  100 mg Oral Daily Lenard Lance, FNP   100 mg at 02/20/23 9604   thiamine (VITAMIN B1) tablet 100 mg  100 mg Oral Daily Lenard Lance, FNP   100 mg at 02/20/23 5409   traZODone (DESYREL) tablet 50 mg  50 mg Oral QHS,MR X 1 Oneta Rack, NP   50 mg at 02/19/23 2329   Current Outpatient Medications  Medication Sig Dispense Refill   acamprosate (CAMPRAL) 333 MG tablet Take 2 tablets (666 mg total) by mouth 3 (three) times daily. 180 tablet 2   b complex vitamins tablet  Take 1 tablet by mouth daily.     B Complex-Biotin-FA (SUPER QUINTS B-50) TABS Take by mouth.     baclofen (LIORESAL) 10 MG tablet Take 1 tablet (10 mg total) by mouth 3 (three) times daily. 270 tablet 0   carboxymethylcellulose (REFRESH PLUS) 0.5 % SOLN Place 1 drop into both eyes 2 (two) times daily as needed (dry eyes).      cholecalciferol (VITAMIN D3) 25 MCG (1000 UNIT) tablet Take by mouth.     hydrOXYzine (VISTARIL) 25 MG capsule 25 mg at bedtime as needed for anxiety (sleep).     irbesartan (AVAPRO) 150 MG tablet Take 150 mg by mouth daily as needed (His BP was dropping really low so his MD told him to take if only if his DBP>90).     levothyroxine (SYNTHROID) 175 MCG tablet Take by mouth.     Multiple Vitamin (MULTIVITAMIN WITH MINERALS) TABS tablet Take 1 tablet by mouth daily. Men's 50+     Omega-3 Fatty Acids (FISH OIL) 1000 MG CAPS Take by mouth.     omeprazole (PRILOSEC) 40 MG capsule TAKE ONE CAPSULE BY MOUTH TWICE A DAY (Patient taking differently: 40 mg 2 (two) times daily as needed (heartburn).) 180 capsule 3   pregabalin (LYRICA) 150 MG capsule Take 1 capsule (150 mg total) by mouth 3 (three) times daily. No early refill 90 capsule 2   rosuvastatin (CRESTOR) 20 MG tablet Take 20 mg by mouth daily.     sertraline (ZOLOFT) 100 MG tablet Take 2 tablets (200 mg total) by mouth daily. (Patient taking differently: Take 100 mg by mouth daily.) 30 tablet 1   thiamine (VITAMIN B1) 100 MG tablet Take 1 tablet by mouth daily.     traZODone (DESYREL) 100 MG tablet Take 100 mg by mouth at bedtime.     Facility-Administered Medications Ordered in Other Encounters  Medication Dose Route Frequency Provider Last Rate Last Admin   traZODone (DESYREL) tablet 100 mg  100 mg Oral QHS Lenard Lance, FNP        Labs  Lab Results:  Admission on 02/18/2023, Discharged on 02/18/2023  Component Date Value Ref Range Status   POC Amphetamine UR 02/18/2023 None Detected  NONE DETECTED (Cut Off Level  1000 ng/mL) Final   POC Secobarbital (BAR) 02/18/2023 None Detected  NONE DETECTED (Cut Off Level 300 ng/mL) Final   POC Buprenorphine (BUP) 02/18/2023 None Detected  NONE DETECTED (Cut Off Level 10 ng/mL) Final  POC Oxazepam (BZO) 02/18/2023 Positive (A)  NONE DETECTED (Cut Off Level 300 ng/mL) Final   POC Cocaine UR 02/18/2023 None Detected  NONE DETECTED (Cut Off Level 300 ng/mL) Final   POC Methamphetamine UR 02/18/2023 None Detected  NONE DETECTED (Cut Off Level 1000 ng/mL) Final   POC Morphine 02/18/2023 None Detected  NONE DETECTED (Cut Off Level 300 ng/mL) Final   POC Methadone UR 02/18/2023 None Detected  NONE DETECTED (Cut Off Level 300 ng/mL) Final   POC Oxycodone UR 02/18/2023 None Detected  NONE DETECTED (Cut Off Level 100 ng/mL) Final   POC Marijuana UR 02/18/2023 None Detected  NONE DETECTED (Cut Off Level 50 ng/mL) Final    Blood Alcohol level:  Lab Results  Component Value Date   ETH 270 (H) 10/15/2017   ETH 394 (HH) 05/19/2017    Metabolic Disorder Labs: No results found for: "HGBA1C", "MPG" No results found for: "PROLACTIN" No results found for: "CHOL", "TRIG", "HDL", "CHOLHDL", "VLDL", "LDLCALC"  Therapeutic Lab Levels: No results found for: "LITHIUM" No results found for: "VALPROATE" No results found for: "CBMZ"  Physical Findings   AUDIT    Flowsheet Row Counselor from 12/09/2017 in [facility] at [facility]  Alcohol Use Disorder Identification Test Final Score (AUDIT) 28      GAD-7    Flowsheet Row Counselor from 10/05/2022 in [place] [facility] at [place] Counselor from 11/04/2021 in [facility] at [facility] Counselor from 12/09/2017 in [facility] at [facility]  Total GAD-7 Score 5 5 4       PHQ2-9    Flowsheet Row ED from 02/18/2023 in [facility] Most recent reading at 02/18/2023  5:47 PM ED from 02/18/2023 in  [facility] Most recent reading at 02/18/2023  3:27 PM Counselor from 10/05/2022 in [facility] at [place] Most recent reading at 10/05/2022  3:17 PM Counselor from 11/04/2021 in [facility] at [place] Most recent reading at 11/04/2021  1:39 PM Counselor from 12/09/2017 in [facility] Outpatient Behavioral Health at [place] Most recent reading at 12/09/2017 12:50 PM  PHQ-2 Total Score 0 0 0 0 3  PHQ-9 Total Score -- -- 4 -- 6      Flowsheet Row ED from 02/18/2023 in [facility] Most recent reading at 02/18/2023  5:25 PM ED from 02/18/2023 in [facility] Most recent reading at 02/18/2023  2:22 PM Counselor from 11/04/2021 in [facility] Behavioral Health at [place] Most recent reading at 11/04/2021  1:40 PM  C-SSRS RISK CATEGORY No Risk No Risk No Risk        Musculoskeletal  Strength & Muscle Tone: within normal limits Gait & Station: normal Patient leans: N/A  Psychiatric Specialty Exam  Presentation  General Appearance:  Appropriate for Environment  Eye Contact: Good  Speech: Clear and Coherent  Speech Volume: Normal  Handedness: Right   Mood and Affect  Mood: Anxious  Affect: Congruent   Thought Process  Thought Processes: Coherent  Descriptions of Associations:Intact  Orientation:Full (Time, Place and Person)  Thought Content:Logical  Diagnosis of Schizophrenia or Schizoaffective disorder in past: No    Hallucinations:Hallucinations: None  Ideas of Reference:None  Suicidal Thoughts:Suicidal Thoughts: No  Homicidal Thoughts:Homicidal Thoughts: No   Sensorium  Memory: Immediate Good; Recent Good; Remote Good  Judgment: Good  Insight: Good   Executive Functions  Concentration: Fair  Attention Span:  Good  Recall: Good  Fund of Knowledge: Fair  Language: Good   Psychomotor  Activity  Psychomotor Activity: Psychomotor Activity: Normal   Assets  Assets: Desire for Improvement   Sleep  Sleep: Sleep: Fair   Nutritional Assessment (For OBS and FBC admissions only) Has the patient had a weight loss or gain of 10 pounds or more in the last 3 months?: No Has the patient had a decrease in food intake/or appetite?: No Does the patient have dental problems?: No Does the patient have eating habits or behaviors that may be indicators of an eating disorder including binging or inducing vomiting?: No Has the patient recently lost weight without trying?: 0 Has the patient been eating poorly because of a decreased appetite?: 0 Malnutrition Screening Tool Score: 0    Physical Exam  Physical Exam Vitals and nursing note reviewed.  Cardiovascular:     Rate and Rhythm: Normal rate and regular rhythm.  Neurological:     Mental Status: He is oriented to person, place, and time.  Psychiatric:        Mood and Affect: Mood normal.        Behavior: Behavior normal.    Review of Systems  Psychiatric/Behavioral:  Positive for substance abuse. Negative for depression and suicidal ideas. The patient is nervous/anxious.   All other systems reviewed and are negative.  Blood pressure (!) 141/99, pulse (!) 52, temperature 97.8 F (36.6 C), temperature source Oral, resp. rate 16, SpO2 98 %. There is no height or weight on file to calculate BMI.  Treatment Plan Summary: Daily contact with patient to assess and evaluate symptoms and progress in treatment and Medication management  Continue with current treatment plan on 02/20/2023 as listed below except were noted   Alcohol abuse: Substance-induced mood disorder: Major depressive disorder: Hypothyroidism:   Continue Ativan taper/CIWA monitoring Continue Zoloft 100 mg p.o. daily Continue trazodone 50 mg p.o. nightly as needed times 1 repeat Continue NicoDerm 14 mg change daily Continue Lyrica 150 mg p.o. 3 times  daily Continue  Synthroid 175 mcg for hypothyroidism   Patient encouraged to participate in the therapeutic milieu Continue current medications as directed CSW to follow-up with discharge disposition  Oneta Rack, NP 02/20/2023 9:54 AM

## 2023-02-20 NOTE — Discharge Instructions (Signed)

## 2023-02-20 NOTE — ED Notes (Signed)
Patient A&O x 4, ambulatory. Patient discharged in no acute distress. Patient denied SI/HI, A/VH upon discharge. Patient verbalized understanding of all discharge instructions explained by staff, to include follow up appointments, RX's and safety plan. Pt belongings returned to patient from locker #23 intact. Patient escorted to lobby via staff for transport to destination. Safety maintained.    

## 2023-02-20 NOTE — ED Notes (Signed)
Pt observed/assessed in room sleeping. RR even and unlabored, appearing in no noted distress. Environmental check complete, will continue to monitor for safety 

## 2023-02-20 NOTE — ED Notes (Signed)
Patient  sleeping in no acute stress. RR even and unlabored .Environment secured .Will continue to monitor for safely. 

## 2023-02-20 NOTE — ED Notes (Signed)
Patient in milieu. Environment is secured. Will continue to monitor for safety. 

## 2023-02-20 NOTE — ED Notes (Signed)
Patient requested discharge due to daughter being induced. Notified provider

## 2023-02-21 ENCOUNTER — Telehealth (HOSPITAL_COMMUNITY): Payer: Self-pay | Admitting: Licensed Clinical Social Worker

## 2023-02-21 ENCOUNTER — Ambulatory Visit (HOSPITAL_COMMUNITY): Payer: Medicare Other

## 2023-02-21 ENCOUNTER — Encounter (HOSPITAL_COMMUNITY): Payer: Self-pay

## 2023-02-21 NOTE — Telephone Encounter (Signed)
Vernon Fowler leaves a voicemail yesterday saying that he was discharged from detox and will not be in group today due to meeting with his Sponsor today at 10 a.m.  The therapist attempts to reach him to ascertain as to what is so important about this meeting with his Sponsor such that he cannot reschedule it and come to group today. The therapist leaves a HIPAA-compliant voicemail.  [address] MA, LCSW, [facility], LCAS 02/21/2023

## 2023-02-22 ENCOUNTER — Telehealth (HOSPITAL_COMMUNITY): Payer: Self-pay | Admitting: Licensed Clinical Social Worker

## 2023-02-22 NOTE — Telephone Encounter (Signed)
Vernon Fowler leaves a voicemail for this therapist saying that his Sponsor wanted to meet with him immediately after he got out of treatment and that the 10 a.m. time slot was "the only" time that he had. Additionally, [name] goes on to say that he was tired as he was not allowed to have his C-Pap while in detox.  [name], MA, LCSW, [facility], LCAS 02/22/2023

## 2023-02-23 ENCOUNTER — Ambulatory Visit (INDEPENDENT_AMBULATORY_CARE_PROVIDER_SITE_OTHER): Payer: Medicare Other | Admitting: Licensed Clinical Social Worker

## 2023-02-23 DIAGNOSIS — F411 Generalized anxiety disorder: Secondary | ICD-10-CM

## 2023-02-23 DIAGNOSIS — F1020 Alcohol dependence, uncomplicated: Secondary | ICD-10-CM | POA: Diagnosis not present

## 2023-02-23 DIAGNOSIS — F17210 Nicotine dependence, cigarettes, uncomplicated: Secondary | ICD-10-CM

## 2023-02-23 NOTE — Progress Notes (Signed)
Daily Group Progress Note   Program: CD IOP     Group Time: 9 a.m. to 12 p.m.    Type of Therapy: Process and Psychoeducational    Topic: The therapist checks in with group members, assesses for SI/HI/psychosis and overall level of functioning. The therapist inquires about sobriety date and number of community support meetings attended since last session.    The therapist introduces a new group member. He answers questions concerning how to ask someone to be a Marketing executive. He explains how to use thought stopping techniques in response to catastrophizing while observing that the most content people are ones who live in the here-and-now versus the traumatic past that no longer exists or the horrible future which has not happened yet. He explains how reality testing can be employed to help someone stuck in emotion mind. The therapist explains what a using dream is and has group members share their experience with this. The therapist also explains how it is imperative that children with developing brains are not exposed to addictive substances such that this does not become hardwired in their brains. The therapist polls group members about their age of first use.        Summary: Vernon Fowler presents rating his depression as a "0" and his anxiety as a "5."    Vernon Fowler returns having been discharged from detox on Sunday. He describes his mood as "anxious" and "grateful." He admits that he drank last Thursday; however, when asked what the trigger was for this, Vernon Fowler responds that he has not had "time to autopsy" this most recent relapse in spite of having missed group Monday to meet with his Sponsor. He says that his Sponsor focused on Step One and if Vernon Fowler was motivated to stop.   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 This latter issue harkens back to a worry Vernon Fowler expressed when in IOP previously about running out of money now that he is retired.   When the therapist asks if Vernon Fowler worries, he responds by saying that he worries about everything. He essentially has a fear of the future. When asked what would be the biggest argument for continuing to drink, he responds that drinking allows him to achieve "oblivion" and  "not feeling anything."  The therapist observes that Vernon Fowler is going to need to develop some skills for avoiding catastrophizing and worrying about the future if he is to stop drinking. He gives Vernon Fowler the homework of writing down what he is worried about on a daily basis as he does not do so and asks Vernon Fowler to schedule an individual meeting with him though Vernon Fowler says that he will need to call back as he does not have his schedule book with him.    Progress Towards Goals: Vernon Fowler drank last Thursday.    UDS collected: No Results: No   AA/NA attended?: Yes   Sponsor?: Yes   Vernon Blazer, MA, LCSW, [facility], LCAS 02/23/2023

## 2023-02-25 ENCOUNTER — Encounter (HOSPITAL_COMMUNITY): Payer: Self-pay | Admitting: Medical

## 2023-02-25 ENCOUNTER — Ambulatory Visit (INDEPENDENT_AMBULATORY_CARE_PROVIDER_SITE_OTHER): Payer: Medicare Other | Admitting: Licensed Clinical Social Worker

## 2023-02-25 DIAGNOSIS — F1110 Opioid abuse, uncomplicated: Secondary | ICD-10-CM

## 2023-02-25 DIAGNOSIS — I10 Essential (primary) hypertension: Secondary | ICD-10-CM

## 2023-02-25 DIAGNOSIS — T7402XS Child neglect or abandonment, confirmed, sequela: Secondary | ICD-10-CM

## 2023-02-25 DIAGNOSIS — F1020 Alcohol dependence, uncomplicated: Secondary | ICD-10-CM | POA: Diagnosis not present

## 2023-02-25 DIAGNOSIS — E785 Hyperlipidemia, unspecified: Secondary | ICD-10-CM

## 2023-02-25 DIAGNOSIS — Z659 Problem related to unspecified psychosocial circumstances: Secondary | ICD-10-CM

## 2023-02-25 DIAGNOSIS — Z6372 Alcoholism and drug addiction in family: Secondary | ICD-10-CM

## 2023-02-25 DIAGNOSIS — E890 Postprocedural hypothyroidism: Secondary | ICD-10-CM

## 2023-02-25 DIAGNOSIS — F341 Dysthymic disorder: Secondary | ICD-10-CM

## 2023-02-25 DIAGNOSIS — F17210 Nicotine dependence, cigarettes, uncomplicated: Secondary | ICD-10-CM

## 2023-02-25 DIAGNOSIS — Z9089 Acquired absence of other organs: Secondary | ICD-10-CM

## 2023-02-25 DIAGNOSIS — F411 Generalized anxiety disorder: Secondary | ICD-10-CM

## 2023-02-25 DIAGNOSIS — Z8659 Personal history of other mental and behavioral disorders: Secondary | ICD-10-CM

## 2023-02-25 DIAGNOSIS — Z79899 Other long term (current) drug therapy: Secondary | ICD-10-CM

## 2023-02-25 DIAGNOSIS — G894 Chronic pain syndrome: Secondary | ICD-10-CM

## 2023-02-25 DIAGNOSIS — F419 Anxiety disorder, unspecified: Secondary | ICD-10-CM

## 2023-02-25 DIAGNOSIS — Z811 Family history of alcohol abuse and dependence: Secondary | ICD-10-CM

## 2023-02-25 NOTE — Progress Notes (Signed)
Patient ID: Vernon Fowler, male   DOB: 30-May-1956, 67 y.o.   MRN: [id number] Pt scheduled for FU but left group early again

## 2023-02-25 NOTE — Progress Notes (Addendum)
Daily Group Progress Note   Program: CD IOP     Group Time: 9 a.m. to 10:30 a.m.   Type of Therapy: Process and Psychoeducational   Topic: The therapist checks in with group members, assesses for SI/HI/psychosis and overall level of functioning. The therapist inquires about sobriety date and number of community support meetings attended since last session.   The therapist has group members complete and discusses the Matrix Model modules on internal and external triggers. The therapist discusses the use of MAT for tobacco cessation noting the health benefits in quitting in addition to the additional benefits of increasing the odds for long-term sobriety. The therapist discusses the need to eliminate caffeine consumption in persons with anxiety disorders and talks about people in early recovery abusing caffeine to feel some kind of buzz.. The therapist discusses HALT (Hungry. Angry. Lonely. Tired.) in relation to how these states contribute to using drugs and alcohol as well as how to avoid being in these states. The therapist discusses Smart Recovery and Celebrate Recovery as alternatives to AA and NA; however, points out that lack of overall availability of these meetings and the benefits of obtaining a Sponsor and working Steps.   The therapist focuses a great deal of time on the avoiding people part of "people, places, and things" focusing on significant others such as boyfriends, girlfriends, husbands, wives, Engineering geologist. The therapist presents information on mindfulness and talks about how taking care of one's grooming, housekeeping, and scheduling can lead to one feeling better emotionally which in turn can reduce the likelihood of relapse.    Summary:  Vernon Fowler presents rating his depression as a "0" and his anxiety as a "3".   He describes his mood as "slightly centered" and "cautious." The therapist has to repeatedly prompt Vernon Fowler when going over his triggers worksheets as he will get on tangents.  It is clear that the overwhelming majority of his triggers are internal. He is writing down his daily worries and says that his Sponsor also told him to "put pen to paper." He brings his schedule book and is to meet one-on-one with this therapist on 02/28/23.  The therapist makes the observation that anxiety and being unable to sit still is a huge trigger for Vernon Fowler while at the same time he has been unable to reduce his excessive caffeine consumption. Thus, the therapist suggests that Vernon Fowler needs to consider eliminating his caffeine altogether not because he wants to but because he needs to. The therapist confronts Vernon Fowler's statement about caffeine being such a "big part" of meetings informing him that he can drink decaffeinated coffee just as easily.  He has to leave group early due to a telephone interview with Social Security to sign up for it. The therapist encourages him to assure that his schedule in the future is clear on IOP days.   Progress Towards Goals: Vernon Fowler reports no change in his sobriety date.    UDS collected: No Results: Yes, positive for alcohol  AA/NA attended?: Yes  Sponsor?: Yes  [name], MA, LCSW, [facility], LCAS Remigio Eisenmenger, MS, LMFT, LCAS 02/25/2023

## 2023-02-28 ENCOUNTER — Ambulatory Visit (HOSPITAL_COMMUNITY): Payer: Medicare Other | Admitting: Licensed Clinical Social Worker

## 2023-02-28 DIAGNOSIS — F411 Generalized anxiety disorder: Secondary | ICD-10-CM

## 2023-02-28 DIAGNOSIS — F1020 Alcohol dependence, uncomplicated: Secondary | ICD-10-CM

## 2023-02-28 DIAGNOSIS — F17210 Nicotine dependence, cigarettes, uncomplicated: Secondary | ICD-10-CM

## 2023-02-28 NOTE — Progress Notes (Signed)
Daily Group Progress Note   Program: [facility] IOP     Group Time: 9 a.m. to 12 p.m.    Type of Therapy: Process and Psychoeducational    Topic: The therapist checks in with group members, assesses for SI/HI/psychosis and overall level of functioning. The therapist inquires about sobriety date and number of community support meetings attended since last session.    Therapist  reviews the concepts of avoiding internal and external triggers, utilizing Distress Tolerance skills, and avoiding using associates in light of two group members' relapses over the weekend. He reframes relapses as opportunities for learning and identifying what changes need to be made in one's recovery plan. The therapist discusses the concept of building barriers to using prior to one's thinking of using.   Summary:  Vernon Fowler presents rating his depression as a "0" and his anxiety as a "3".    He describes his mood as "anxious" and "hopeful" Vernon Fowler reports that he has made a plan to help him in his recovery. He states he is to be home in twenty minutes from when IOP ends so his fianc will know he has not been out acquiring alcohol.  Additionally, she has control of his credit cards, money, Engineering ge[employer].   Vernon Fowler reports he made the fear list that was asked of him in his last individual session.  He reports he has made progress on decreasing his level of caffeine and in turn this has decreased his anxiety.    Vernon Fowler states that his eldest daughter who said she was done with him has reached out to him.   He says he feels very grateful for this.    He notes that his finance who has five years abstinent was a speaker for a meeting he attended.  He discusses how she has a very tragic story.  Vernon Fowler reports he is working on steps 1,2 and 3 with his sponsor.  Vernon Fowler reports his take away from the group today is "efforts I am making to reduce anxiety"   Progress Towards Goals: Vernon Fowler reports no change in his sobriety date.     UDS collected:  Yes  Results: no  AA/NA attended?: Yes   Sponsor?: Yes  Individual Session  Time: 1pm to 2pm  Intervention: CBT  Summary Today therapist met with Vernon Fowler after IOP group.  Vernon Fowler brings with him the lists of fears he has noted. They are as follows:  fear of future including fear his money will run out, fear he will not be able to pay house taxes and repairs given his house is an older house, fear of being alone, fears about his daughter in [place] who vapes THC frequently, fear regarding his other daughter who had previously had seizures coming off benzodiazepines, fear of paying for the cost of his wedding, afraid of her leaving if he relapses as she has five years of sobriety, fears of what will happen to his health given he has drank for twenty seven years.  Therapist pointed out that Vernon Fowler's finance is enabling him given that she has not postponed the wedding given his lack of sobriety with Vernon Fowler noting that if he does not drink between now and the wedding that he will have 5 months of sobriety.  Therapist observes that though Vernon Fowler may be trying to be helpful, his continual advice to others in the group diverts the attention away from his own issues and that people are unlikely to take his recovery advice in the absence of his being able  to remain sober. Therapist observes  that  Vernon Fowler knows the mechanics of recovery; however, he is allowing fear over things he cannot control to rule is life such that he is in a constant state of anxiety. The therapist explains now Step 3 is aimed at being able to let go of these fears and to have faith that one can get through the worst life circumstances without having to drink. The therapist notes that Vernon Fowler's current sobriety seems contingent upon nothing bad happening or what he perceives to be his "security" being threatened; however, this is not living "life on life's terms."   Vernon Fowler became quiet and the session ended with Vernon Fowler saying he would think  about these things.   Vernon Blazer, MA, LCSW, LCMHC, LCAS Vernon Eisenmenger, MS, LMFT, LCAS 02/28/2023

## 2023-03-02 ENCOUNTER — Encounter (HOSPITAL_COMMUNITY): Payer: Self-pay

## 2023-03-02 ENCOUNTER — Telehealth (HOSPITAL_COMMUNITY): Payer: Self-pay | Admitting: Licensed Clinical Social Worker

## 2023-03-02 ENCOUNTER — Ambulatory Visit (HOSPITAL_COMMUNITY): Payer: Medicare Other

## 2023-03-02 NOTE — Telephone Encounter (Signed)
The therapist receives a voicemail from Vernon Fowler saying that he will not be in group today due to illness and adds that he is not drinking.   [address] MA, LCSW, [facility], LCAS 03/02/2023

## 2023-03-04 ENCOUNTER — Ambulatory Visit (HOSPITAL_COMMUNITY): Payer: Medicare Other

## 2023-03-04 ENCOUNTER — Encounter (HOSPITAL_COMMUNITY): Payer: Self-pay

## 2023-03-04 ENCOUNTER — Telehealth (HOSPITAL_COMMUNITY): Payer: Self-pay | Admitting: Licensed Clinical Social Worker

## 2023-03-04 NOTE — Telephone Encounter (Signed)
As [name] is a no show/no call for group, the therapist attempts to reach him by phone leaving a HIPAA-compliant voicemail.  [address], MA, LCSW, [facility], LCAS 03/04/2023

## 2023-03-07 ENCOUNTER — Telehealth (HOSPITAL_COMMUNITY): Payer: Self-pay | Admitting: Licensed Clinical Social Worker

## 2023-03-07 ENCOUNTER — Ambulatory Visit (HOSPITAL_COMMUNITY): Payer: Medicare Other

## 2023-03-07 ENCOUNTER — Encounter (HOSPITAL_COMMUNITY): Payer: Self-pay

## 2023-03-07 NOTE — Telephone Encounter (Signed)
The therapist receives a voicemail from [place] saying that he will not be in group on 03/07/23 as he is in [place] once again helping his daughter. He says that he will also not be in group on Friday as he is going to be attending an [employer] on Thursday.  Based on Vernon Fowler's missing group on Wednesday and Friday of last week, with Friday being a no show and no call, and his planning on missing group two days this week, the therapist will staff this case with the PA-C as this therapist recommends discharging Vernon Fowler from [facility] IOP. The therapist previously talked to [facility] about the need to make CD IOP a priority and scheduling things around [facility] IOP which he has failed to do.   [address], MA, LCSW, [facility], LCAS 03/07/2023

## 2023-03-07 NOTE — Telephone Encounter (Signed)
The therapist consults with the PA-C from CD IOP and reviews [name]'s CD IOP attendance. [name] has attended on 7 of 15 groups and left some of the groups he attended early. Based on this information and his plan to miss group again on Friday, all members of the [facility] IOP team, including Remigio Eisenmenger, LMFT, LCAS agree that [name] should be discharged from [facility] IOP and referred to a higher level of care.   The therapist attempts to reach [name] by phone leaving a HIPAA-compliant voicemail advising him to call this therapist back directly before returning to group on 03/09/23.  [address], MA, LCSW, [facility], LCAS 03/07/2023

## 2023-03-08 ENCOUNTER — Telehealth (HOSPITAL_COMMUNITY): Payer: Self-pay | Admitting: Licensed Clinical Social Worker

## 2023-03-08 ENCOUNTER — Encounter (HOSPITAL_COMMUNITY): Payer: Self-pay | Admitting: Licensed Clinical Social Worker

## 2023-03-08 DIAGNOSIS — Z811 Family history of alcohol abuse and dependence: Secondary | ICD-10-CM

## 2023-03-08 DIAGNOSIS — E890 Postprocedural hypothyroidism: Secondary | ICD-10-CM

## 2023-03-08 DIAGNOSIS — I10 Essential (primary) hypertension: Secondary | ICD-10-CM

## 2023-03-08 DIAGNOSIS — F411 Generalized anxiety disorder: Secondary | ICD-10-CM

## 2023-03-08 DIAGNOSIS — Z659 Problem related to unspecified psychosocial circumstances: Secondary | ICD-10-CM

## 2023-03-08 DIAGNOSIS — Z6372 Alcoholism and drug addiction in family: Secondary | ICD-10-CM

## 2023-03-08 DIAGNOSIS — F1110 Opioid abuse, uncomplicated: Secondary | ICD-10-CM

## 2023-03-08 DIAGNOSIS — Z79899 Other long term (current) drug therapy: Secondary | ICD-10-CM

## 2023-03-08 DIAGNOSIS — G894 Chronic pain syndrome: Secondary | ICD-10-CM

## 2023-03-08 DIAGNOSIS — T7402XS Child neglect or abandonment, confirmed, sequela: Secondary | ICD-10-CM

## 2023-03-08 DIAGNOSIS — Z91199 Patient's noncompliance with other medical treatment and regimen due to unspecified reason: Secondary | ICD-10-CM

## 2023-03-08 DIAGNOSIS — Z8659 Personal history of other mental and behavioral disorders: Secondary | ICD-10-CM

## 2023-03-08 DIAGNOSIS — F341 Dysthymic disorder: Secondary | ICD-10-CM

## 2023-03-08 DIAGNOSIS — F17210 Nicotine dependence, cigarettes, uncomplicated: Secondary | ICD-10-CM

## 2023-03-08 DIAGNOSIS — E785 Hyperlipidemia, unspecified: Secondary | ICD-10-CM

## 2023-03-08 DIAGNOSIS — F419 Anxiety disorder, unspecified: Secondary | ICD-10-CM

## 2023-03-08 DIAGNOSIS — F1020 Alcohol dependence, uncomplicated: Secondary | ICD-10-CM

## 2023-03-08 NOTE — Telephone Encounter (Signed)
The therapist receives a call from [facility] in response to the therapist's call from yesterday. Vernon Fowler says in his voicemail that he "thinks" he knows the reason the therapist is calling and admits that he felt "beat-up on" during his one-on-one meeting with this therapist and Ms. Remigio Eisenmenger, LMFT, LCAS.  He says that he is going to "take a break" and see a psychiatrist at [facility] and go "all in" with AA.  The therapist attempts to reach [facility] leaving a HIPAA-compliant voicemail letting him know that this therapist will be sending him a letter via My Chart and that if Vernon Fowler has any questions or concerns about this or needs additional assistance that he can reach this therapist directly at [phone].  [name], MA, LCSW, [facility], LCAS 03/08/2023

## 2023-03-09 ENCOUNTER — Encounter (HOSPITAL_COMMUNITY): Payer: Self-pay

## 2023-03-09 ENCOUNTER — Ambulatory Visit (HOSPITAL_COMMUNITY): Payer: Medicare Other

## 2023-03-09 NOTE — Addendum Note (Signed)
Addended by: [name] on: 03/09/2023 04:43 PM   Modules accepted: Orders
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
## 2023-03-11 ENCOUNTER — Ambulatory Visit (HOSPITAL_COMMUNITY): Payer: Medicare Other

## 2023-03-14 ENCOUNTER — Ambulatory Visit (HOSPITAL_COMMUNITY): Payer: Medicare Other

## 2023-03-14 ENCOUNTER — Encounter: Payer: Self-pay | Admitting: Medical

## 2023-03-16 ENCOUNTER — Ambulatory Visit (HOSPITAL_COMMUNITY): Payer: Medicare Other

## 2023-03-17 ENCOUNTER — Ambulatory Visit (HOSPITAL_COMMUNITY): Payer: Medicare Other | Admitting: Medical

## 2023-03-18 ENCOUNTER — Ambulatory Visit (HOSPITAL_COMMUNITY): Payer: Medicare Other

## 2023-03-23 ENCOUNTER — Ambulatory Visit (INDEPENDENT_AMBULATORY_CARE_PROVIDER_SITE_OTHER): Payer: Medicare Other | Admitting: Pulmonary Disease

## 2023-03-23 ENCOUNTER — Encounter (HOSPITAL_BASED_OUTPATIENT_CLINIC_OR_DEPARTMENT_OTHER): Payer: Self-pay | Admitting: Pulmonary Disease

## 2023-03-23 VITALS — BP 112/78 | HR 67 | Resp 16 | Ht 73.0 in | Wt 218.8 lb

## 2023-03-23 DIAGNOSIS — G4733 Obstructive sleep apnea (adult) (pediatric): Secondary | ICD-10-CM | POA: Diagnosis not present

## 2023-03-23 NOTE — Patient Instructions (Signed)
Call if you are still having trouble with air leak from your CPAP mask and we can order a chin strap.  Follow up in 1 year.

## 2023-03-23 NOTE — Progress Notes (Signed)
[place] [facility], [facility] Sleep [facility]  Chief Complaint  Patient presents with   Consult    Got a new machine and he wanted to check in.     Past Surgical History:  He  has a past surgical history that includes Thyroidectomy; Colonoscopy; Shoulder arthroscopy with rotator cuff repair (Right, 03/01/2019); Fasciotomy (Right, 10/2019); laparotomy (N/A, 12/13/2020); Upper gastrointestinal endoscopy; Colonoscopy (04/2016); and Small intestine surgery (12/13/2020).  Past Medical History:  ETOH, Anxiety, GERD, HLD, HTN, Hypothyroidism  Constitutional:  BP 112/78   Pulse 67   Resp 16   Ht 6\' 1"  (1.854 m)   Wt 218 lb 12.8 oz (99.2 kg)   SpO2 98%   BMI 28.87 kg/m   Brief Summary:  Vernon Fowler is a 67 y.o. male with obstructive sleep apnea.      Subjective:   I last saw him in 2019.    He received a new CPAP machine earlier this year.  Uses this nightly.  His phone APP shows his AHI is never higher than 4, and he gets between 6 to 10 hours of usage per night.  He does have trouble with air leak.  He recently got a different sized nasal mask and this seems to be fitting better.  His dentist will be getting him a mouthguard for bruxism, and he was told this should help with mouth breathing.  He goes to sleep at 12 to 1 am.  He falls asleep in 20 minutes.  He wakes up 2 or 3  times to take care of his dogs or use the bathroom.  He gets out of bed between 9 and 10 am.  He feels good in the morning.  He denies morning headache.  He uses trazodone at 11 pm.  He drinks several cups of coffee during the day.  He denies sleep walking, sleep talking, bruxism, or nightmares.  There is no history of restless legs.  He denies sleep hallucinations, sleep paralysis, or cataplexy.  The Epworth score is 2 out of 24.   Physical Exam:   Appearance - well kempt   ENMT - no sinus tenderness, no oral exudate, no LAN, Mallampati 3 airway, no stridor  Respiratory - equal breath  sounds bilaterally, no wheezing or rales  CV - s1s2 regular rate and rhythm, no murmurs  Ext - no clubbing, no edema  Skin - no rashes  Psych - normal mood and affect   Sleep Tests:    Social History:  He  reports that he quit smoking about 4 years ago. His smoking use included cigarettes. He started smoking about 8 years ago. He has a 1 pack-year smoking history. He has never used smokeless tobacco. He reports that he does not currently use alcohol. He reports that he does not use drugs.  Family History:  His family history includes Anxiety disorder in his daughter and mother; Cancer in his maternal grandmother, mother, and paternal grandmother; Diabetes in his father; Heart disease in his paternal grandfather; Hyperlipidemia in his father; Hypertension in his father.     Assessment/Plan:   Obstructive sleep apnea. - he is compliant with CPAP and reports benefit from therapy - he will be getting a mouthguard for bruxism, and he is hoping this will improve his mouth breathing and air leak - if these persist, then he will call to either get a chin strap or change to a full face mask  Time Spent Involved in Patient Care on Day of Examination:  21  minutes  Follow up:   Patient Instructions  Call if you are still having trouble with air leak from your CPAP mask and we can order a chin strap.  Follow up in 1 year.  Medication List:   Allergies as of 03/23/2023       Reactions   Sulfa Antibiotics Rash   Fixed based skin reaction        Medication List        Accurate as of March 23, 2023  2:17 PM. If you have any questions, ask your nurse or doctor.          acamprosate 333 MG tablet Commonly known as: CAMPRAL Take 2 tablets (666 mg total) by mouth 3 (three) times daily.   b complex vitamins tablet Take 1 tablet by mouth daily.   carboxymethylcellulose 0.5 % Soln Commonly known as: REFRESH PLUS Place 1 drop into both eyes 2 (two) times daily as needed (dry  eyes).   cholecalciferol 25 MCG (1000 UNIT) tablet Commonly known as: VITAMIN D3 Take by mouth.   Fish Oil 1000 MG Caps Take by mouth.   hydrOXYzine 25 MG tablet Commonly known as: ATARAX   irbesartan 150 MG tablet Commonly known as: AVAPRO Take 150 mg by mouth daily as needed (His BP was dropping really low so his MD told him to take if only if his DBP>90).   levothyroxine 175 MCG tablet Commonly known as: SYNTHROID Take by mouth.   multivitamin with minerals Tabs tablet Take 1 tablet by mouth daily. Men's 50+   omeprazole 40 MG capsule Commonly known as: PRILOSEC TAKE ONE CAPSULE BY MOUTH TWICE A DAY What changed:  how to take this when to take this reasons to take this   pregabalin 150 MG capsule Commonly known as: LYRICA Take 1 capsule (150 mg total) by mouth 3 (three) times daily. No early refill   propranolol 20 MG tablet Commonly known as: INDERAL   rosuvastatin 20 MG tablet Commonly known as: CRESTOR Take 20 mg by mouth daily.   sertraline 100 MG tablet Commonly known as: ZOLOFT Take 2 tablets (200 mg total) by mouth daily. What changed: how much to take   Super Quints B-50 Tabs Take by mouth.   thiamine 100 MG tablet Commonly known as: VITAMIN B1 Take 1 tablet by mouth daily.   traZODone 100 MG tablet Commonly known as: DESYREL Take 100 mg by mouth at bedtime.   Vistaril 25 MG capsule Generic drug: hydrOXYzine 25 mg at bedtime as needed for anxiety (sleep).        Signature:  Coralyn Helling, MD [facility] Pulmonary/Critical Care Pager - [phone] 03/23/2023, 2:17 PM

## 2023-05-04 ENCOUNTER — Other Ambulatory Visit: Payer: Self-pay | Admitting: Urology

## 2023-05-04 DIAGNOSIS — I712 Thoracic aortic aneurysm, without rupture, unspecified: Secondary | ICD-10-CM

## 2023-07-15 ENCOUNTER — Other Ambulatory Visit: Payer: Medicare Other

## 2023-07-27 ENCOUNTER — Inpatient Hospital Stay: Admission: RE | Admit: 2023-07-27 | Payer: Medicare Other | Source: Ambulatory Visit

## 2023-07-30 ENCOUNTER — Emergency Department (HOSPITAL_COMMUNITY): Payer: Medicare Other

## 2023-07-30 ENCOUNTER — Inpatient Hospital Stay (HOSPITAL_COMMUNITY)
Admission: EM | Admit: 2023-07-30 | Discharge: 2023-08-03 | DRG: 894 | Discharge status: Against Medical Advice | Payer: Medicare Other | Attending: Pulmonary Disease | Admitting: Pulmonary Disease

## 2023-07-30 ENCOUNTER — Other Ambulatory Visit: Payer: Self-pay

## 2023-07-30 ENCOUNTER — Encounter (HOSPITAL_COMMUNITY): Payer: Self-pay

## 2023-07-30 DIAGNOSIS — R339 Retention of urine, unspecified: Secondary | ICD-10-CM | POA: Diagnosis present

## 2023-07-30 DIAGNOSIS — F1020 Alcohol dependence, uncomplicated: Secondary | ICD-10-CM | POA: Diagnosis present

## 2023-07-30 DIAGNOSIS — F10231 Alcohol dependence with withdrawal delirium: Secondary | ICD-10-CM | POA: Diagnosis not present

## 2023-07-30 DIAGNOSIS — F10932 Alcohol use, unspecified with withdrawal with perceptual disturbance: Secondary | ICD-10-CM

## 2023-07-30 DIAGNOSIS — E890 Postprocedural hypothyroidism: Secondary | ICD-10-CM | POA: Diagnosis present

## 2023-07-30 DIAGNOSIS — F10931 Alcohol use, unspecified with withdrawal delirium: Secondary | ICD-10-CM | POA: Diagnosis present

## 2023-07-30 DIAGNOSIS — K709 Alcoholic liver disease, unspecified: Secondary | ICD-10-CM | POA: Diagnosis present

## 2023-07-30 DIAGNOSIS — Z79890 Hormone replacement therapy (postmenopausal): Secondary | ICD-10-CM

## 2023-07-30 DIAGNOSIS — I959 Hypotension, unspecified: Secondary | ICD-10-CM | POA: Diagnosis present

## 2023-07-30 DIAGNOSIS — Z8711 Personal history of peptic ulcer disease: Secondary | ICD-10-CM

## 2023-07-30 DIAGNOSIS — F10220 Alcohol dependence with intoxication, uncomplicated: Principal | ICD-10-CM | POA: Diagnosis present

## 2023-07-30 DIAGNOSIS — Z818 Family history of other mental and behavioral disorders: Secondary | ICD-10-CM

## 2023-07-30 DIAGNOSIS — Z882 Allergy status to sulfonamides status: Secondary | ICD-10-CM

## 2023-07-30 DIAGNOSIS — Z5329 Procedure and treatment not carried out because of patient's decision for other reasons: Secondary | ICD-10-CM | POA: Diagnosis present

## 2023-07-30 DIAGNOSIS — G2581 Restless legs syndrome: Secondary | ICD-10-CM | POA: Diagnosis present

## 2023-07-30 DIAGNOSIS — Z781 Physical restraint status: Secondary | ICD-10-CM

## 2023-07-30 DIAGNOSIS — E039 Hypothyroidism, unspecified: Secondary | ICD-10-CM | POA: Diagnosis present

## 2023-07-30 DIAGNOSIS — F32A Depression, unspecified: Secondary | ICD-10-CM | POA: Diagnosis present

## 2023-07-30 DIAGNOSIS — E785 Hyperlipidemia, unspecified: Secondary | ICD-10-CM | POA: Diagnosis present

## 2023-07-30 DIAGNOSIS — Z85850 Personal history of malignant neoplasm of thyroid: Secondary | ICD-10-CM

## 2023-07-30 DIAGNOSIS — G4730 Sleep apnea, unspecified: Secondary | ICD-10-CM | POA: Diagnosis present

## 2023-07-30 DIAGNOSIS — F10939 Alcohol use, unspecified with withdrawal, unspecified: Secondary | ICD-10-CM | POA: Diagnosis not present

## 2023-07-30 DIAGNOSIS — F411 Generalized anxiety disorder: Secondary | ICD-10-CM | POA: Diagnosis present

## 2023-07-30 DIAGNOSIS — Z833 Family history of diabetes mellitus: Secondary | ICD-10-CM

## 2023-07-30 DIAGNOSIS — Y908 Blood alcohol level of 240 mg/100 ml or more: Secondary | ICD-10-CM | POA: Diagnosis present

## 2023-07-30 DIAGNOSIS — K2270 Barrett's esophagus without dysplasia: Secondary | ICD-10-CM | POA: Diagnosis present

## 2023-07-30 DIAGNOSIS — E876 Hypokalemia: Secondary | ICD-10-CM | POA: Diagnosis present

## 2023-07-30 DIAGNOSIS — I10 Essential (primary) hypertension: Secondary | ICD-10-CM | POA: Diagnosis present

## 2023-07-30 DIAGNOSIS — F329 Major depressive disorder, single episode, unspecified: Secondary | ICD-10-CM | POA: Diagnosis present

## 2023-07-30 DIAGNOSIS — K219 Gastro-esophageal reflux disease without esophagitis: Secondary | ICD-10-CM | POA: Diagnosis present

## 2023-07-30 DIAGNOSIS — I2510 Atherosclerotic heart disease of native coronary artery without angina pectoris: Secondary | ICD-10-CM | POA: Diagnosis present

## 2023-07-30 DIAGNOSIS — Z79899 Other long term (current) drug therapy: Secondary | ICD-10-CM

## 2023-07-30 DIAGNOSIS — F17210 Nicotine dependence, cigarettes, uncomplicated: Secondary | ICD-10-CM | POA: Diagnosis present

## 2023-07-30 DIAGNOSIS — Z809 Family history of malignant neoplasm, unspecified: Secondary | ICD-10-CM

## 2023-07-30 DIAGNOSIS — Z83438 Family history of other disorder of lipoprotein metabolism and other lipidemia: Secondary | ICD-10-CM

## 2023-07-30 DIAGNOSIS — Z8249 Family history of ischemic heart disease and other diseases of the circulatory system: Secondary | ICD-10-CM

## 2023-07-30 DIAGNOSIS — G4733 Obstructive sleep apnea (adult) (pediatric): Secondary | ICD-10-CM | POA: Diagnosis present

## 2023-07-30 LAB — COMPREHENSIVE METABOLIC PANEL
ALT: 67 U/L — ABNORMAL HIGH (ref 0–44)
AST: 60 U/L — ABNORMAL HIGH (ref 15–41)
Albumin: 4.7 g/dL (ref 3.5–5.0)
Alkaline Phosphatase: 49 U/L (ref 38–126)
Anion gap: 18 — ABNORMAL HIGH (ref 5–15)
BUN: 17 mg/dL (ref 8–23)
CO2: 20 mmol/L — ABNORMAL LOW (ref 22–32)
Calcium: 8.5 mg/dL — ABNORMAL LOW (ref 8.9–10.3)
Chloride: 102 mmol/L (ref 98–111)
Creatinine, Ser: 1.03 mg/dL (ref 0.61–1.24)
GFR, Estimated: >60 mL/min (ref >60)
Glucose, Bld: 67 mg/dL — ABNORMAL LOW (ref 70–99)
Potassium: 3.5 mmol/L (ref 3.5–5.1)
Sodium: 140 mmol/L (ref 135–145)
Total Bilirubin: 0.6 mg/dL (ref <1.2)
Total Protein: 8 g/dL (ref 6.5–8.1)

## 2023-07-30 LAB — CBC
HCT: 43.5 % (ref 39.0–52.0)
Hemoglobin: 15.1 g/dL (ref 13.0–17.0)
MCH: 36 pg — ABNORMAL HIGH (ref 26.0–34.0)
MCHC: 34.7 g/dL (ref 30.0–36.0)
MCV: 103.8 fL — ABNORMAL HIGH (ref 80.0–100.0)
Platelets: 210 10*3/uL (ref 150–400)
RBC: 4.19 MIL/uL — ABNORMAL LOW (ref 4.22–5.81)
RDW: 13.3 % (ref 11.5–15.5)
WBC: 6 10*3/uL (ref 4.0–10.5)
nRBC: 0 % (ref 0.0–0.2)

## 2023-07-30 LAB — LIPASE, BLOOD: Lipase: 38 U/L (ref 11–51)

## 2023-07-30 LAB — ACETAMINOPHEN LEVEL: Acetaminophen (Tylenol), Serum: <10 ug/mL — ABNORMAL LOW (ref 10–30)

## 2023-07-30 LAB — AMMONIA: Ammonia: 18 umol/L (ref 9–35)

## 2023-07-30 LAB — ETHANOL: Alcohol, Ethyl (B): 369 mg/dL (ref <10)

## 2023-07-30 LAB — SALICYLATE LEVEL: Salicylate Lvl: <7 mg/dL — ABNORMAL LOW (ref 7.0–30.0)

## 2023-07-30 LAB — RAPID URINE DRUG SCREEN, HOSP PERFORMED
Amphetamines: NOT DETECTED
Barbiturates: NOT DETECTED
Benzodiazepines: POSITIVE — AB
Cocaine: NOT DETECTED
Opiates: NOT DETECTED
Tetrahydrocannabinol: NOT DETECTED

## 2023-07-30 MED ORDER — THIAMINE MONONITRATE 100 MG PO TABS
100.0000 mg | ORAL_TABLET | Freq: Every day | ORAL | Status: DC
  Administered 2023-07-31: 100 mg via ORAL
  Filled 2023-07-30 (×2): qty 1

## 2023-07-30 MED ORDER — LORAZEPAM 1 MG PO TABS
0.0000 mg | ORAL_TABLET | Freq: Four times a day (QID) | ORAL | Status: DC
  Administered 2023-07-30: 1 mg via ORAL
  Administered 2023-07-31 – 2023-08-01 (×4): 2 mg via ORAL
  Filled 2023-07-30 (×3): qty 2
  Filled 2023-07-30: qty 1
  Filled 2023-07-30: qty 2

## 2023-07-30 MED ORDER — LORAZEPAM 2 MG/ML IJ SOLN: 0.0000 mg | Freq: Two times a day (BID) | INTRAMUSCULAR | Status: DC

## 2023-07-30 MED ORDER — THIAMINE HCL 100 MG/ML IJ SOLN
100.0000 mg | Freq: Every day | INTRAMUSCULAR | Status: DC
  Administered 2023-07-30 – 2023-08-01 (×2): 100 mg via INTRAVENOUS
  Filled 2023-07-30 (×2): qty 2

## 2023-07-30 MED ORDER — LORAZEPAM 1 MG PO TABS: 0.0000 mg | ORAL_TABLET | Freq: Two times a day (BID) | ORAL | Status: DC

## 2023-07-30 MED ORDER — LORAZEPAM 2 MG/ML IJ SOLN
0.0000 mg | Freq: Four times a day (QID) | INTRAMUSCULAR | Status: DC
  Administered 2023-07-30: 2 mg via INTRAVENOUS
  Administered 2023-07-31: 1 mg via INTRAVENOUS
  Filled 2023-07-30 (×2): qty 1
  Filled 2023-07-30: qty 2

## 2023-07-30 NOTE — ED Notes (Signed)
Patient urinated in urinal and then poured it down the sink

## 2023-07-30 NOTE — ED Triage Notes (Addendum)
Patient is tearful at time of triage. Patient reports he is an alcoholic, drinks a 2-3 pints of vodka and 1 bottle of wine a day. Wants to detox. Last drink was today per family. Patient reports currently nauseous. Has been through DTS previously without seizures. Denies SI

## 2023-07-30 NOTE — Consult Note (Signed)
[facility] ED ASSESSMENT   Reason for Consult:  Psychiatry evaluation Referring Physician:  ER Physician Patient Identification: Vernon Fowler MRN:  [id number] ED Chief Complaint: Alcohol use disorder, severe, dependence (HCC)  Diagnosis:  Principal Problem:   Alcohol use disorder, severe, dependence [facility])   ED Assessment Time Calculation: Start Time: 1806 Stop Time: 1831 Total Time in Minutes (Assessment Completion): 25   Subjective:   Vernon Fowler is a 67 y.o. male patient admitted with previous Psychiatry hx of Alcohol use disorder severe dependence, .GAD was brought in by his fiance after he drank more than needed alcohol today.  Both Patient and his GF believe he need detox treatment.  He does  experience  DT but no Alcohol withdrawal seizure disorder.  HPI:  Patient was seen in the stretcher resting and he engaged in meaningful conversation.  Patient admitted he drank 2 1/2 bottles of wine and half a pint of Vodka this morning before coming to the ER.  He also added that he drinks same amount daily.  Patient's alcohol level is 369 on arrival to the ER.  He denies any drug use hx.  Patient reports that alcohol helps him sleep, helps him take his mind off stress and it suppresses life events.   Patient reports longest period of Sobriety was 17 Months and he relapsed after that.   Patient has been to 14 Detox treatments and seven Rehabilitation treatment.  His last 30  days Rehab was at [facility] five weeks ago  and he relapsed four days he came back home.  Patient reports that he has been for treatment at [facility] and [facility].  Patient reports drinking until he passes out some days.  He admits that his -AST/ALT are 60/67.    Caucasian male, 67 years old seen this evening seeking Detox treatment for Alcohol use is seen disheveled, calm and cooperative.  He reports feeling sweaty and has tremors of his fingers.  Patient denied nausea and vomiting but did report nausea in triage.   Patient meets criteria for Alcohol detox treatment and he is willing to try another rehabilitation treatment after detox.  Patient denies feeling depressed but admitted to feeling anxious which acknowledges is part of his withdrawal symptoms.  We will Start CIWA Protocol using Ativan for coverage.  We will seek inpatient Psychiatry hospitalization.for detox treatment.  We will fax out records to facilities with available bed. Past Psychiatric History: Alcohol use disorder severe dependence, .GAD.  Patient reports 14 Detox treatment and 7 Rehabilitation treatment.  Multiple [facility] treatments and visits.  Risk to Self or Others: Is the patient at risk to self? No Has the patient been a risk to self in the past 6 months? No Has the patient been a risk to self within the distant past? No Is the patient a risk to others? No Has the patient been a risk to others in the past 6 months? No Has the patient been a risk to others within the distant past? No  [place] Scale:  Flowsheet Row ED from 07/30/2023 in [facility] Emergency Department at [facility] Most recent reading at 07/30/2023  4:15 PM ED from 02/18/2023 in [facility] Most recent reading at 02/18/2023  5:25 PM ED from 02/18/2023 in [facility] Most recent reading at 02/18/2023  2:22 PM  C-SSRS RISK CATEGORY Low Risk No Risk No Risk       AIMS:  , , ,  ,  ASAM:    Substance Abuse:     Past Medical History:  Past Medical History:  Diagnosis Date   Alcohol abuse    Anxiety    Cancer (HCC)    GERD (gastroesophageal reflux disease)    Hyperlipidemia    Hypertension    Sleep apnea    w/CPAP   Thyroid disease     Past Surgical History:  Procedure Laterality Date   COLONOSCOPY     COLONOSCOPY  04/2016   [place] polyps   FASCIOTOMY Right 10/2019   LAPAROTOMY N/A 12/13/2020   Procedure: EXPLORATORY LAPAROTOMY, LYSIS OF ADHESIONS;  Surgeon: Romie Levee, MD;   Location: [facility] ORS;  Service: General;  Laterality: N/A;   SHOULDER ARTHROSCOPY WITH ROTATOR CUFF REPAIR Right 03/01/2019   Procedure: Right shoulder arthroscopy, subacromial decompression, distal clavicle resection, rotator cuff repair;  Surgeon: Francena Hanly, MD;  Location: [facility] ORS;  Service: Orthopedics;  Laterality: Right;   SMALL INTESTINE SURGERY  12/13/2020   SBO   THYROIDECTOMY     1987   UPPER GASTROINTESTINAL ENDOSCOPY     Family History:  Family History  Problem Relation Age of Onset   Cancer Mother    Anxiety disorder Mother    Diabetes Father    Hyperlipidemia Father    Hypertension Father    Cancer Maternal Grandmother    Cancer Paternal Grandmother    Heart disease Paternal Grandfather    Anxiety disorder Daughter    Colon cancer Neg Hx    Esophageal cancer Neg Hx    Stomach cancer Neg Hx    Rectal cancer Neg Hx    Colon polyps Neg Hx    Family Psychiatric  History: Mother and mother's side of the family -Alcoholics. Social History:  Social History   Substance and Sexual Activity  Alcohol Use Not Currently   Comment: recovery ETOH - last drink 10/04/21     Social History   Substance and Sexual Activity  Drug Use Never    Social History   Socioeconomic History   Marital status: Single    Spouse name: Not on file   Number of children: Not on file   Years of education: Not on file   Highest education level: Not on file  Occupational History   Not on file  Tobacco Use   Smoking status: Former    Current packs/day: 0.00    Average packs/day: 0.3 packs/day for 4.0 years (1.0 ttl pk-yrs)    Types: Cigarettes    Start date: 11/17/2014    Quit date: 11/17/2018    Years since quitting: 4.7   Smokeless tobacco: Never   Tobacco comments:    4 per day  Vaping Use   Vaping status: Never Used  Substance and Sexual Activity   Alcohol use: Not Currently    Comment: recovery ETOH - last drink 10/04/21   Drug use: Never   Sexual activity: Yes  Other Topics  Concern   Not on file  Social History Narrative   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   2 daughters - [place] and [place]   Social Determinants of Health   Financial Resource Strain: Not on file  Food Insecurity: No Food Insecurity (02/18/2023)   Hunger Vital Sign    Worried About Running Out of Food in the Last Year: Never true    Ran Out of Food in the Last Year: Never true  Transportation Needs: No Transportation  Needs (02/18/2023)   PRAPARE - Administrator, Civil Service (Medical): No    Lack of Transportation (Non-Medical): No  Physical Activity: Not on file  Stress: Not on file  Social Connections: Not on file   Additional Social History:    Allergies:   Allergies  Allergen Reactions   Sulfa Antibiotics Rash    Fixed based skin reaction    Labs:  Results for orders placed or performed during the hospital encounter of 07/30/23 (from the past 48 hour(s))  Comprehensive metabolic panel     Status: Abnormal   Collection Time: 07/30/23  4:37 PM  Result Value Ref Range   Sodium 140 135 - 145 mmol/L   Potassium 3.5 3.5 - 5.1 mmol/L   Chloride 102 98 - 111 mmol/L   CO2 20 (L) 22 - 32 mmol/L   Glucose, Bld 67 (L) 70 - 99 mg/dL    Comment: Glucose reference range applies only to samples taken after fasting for at least 8 hours.   BUN 17 8 - 23 mg/dL   Creatinine, Ser 6.64 0.61 - 1.24 mg/dL   Calcium 8.5 (L) 8.9 - 10.3 mg/dL   Total Protein 8.0 6.5 - 8.1 g/dL   Albumin 4.7 3.5 - 5.0 g/dL   AST 60 (H) 15 - 41 U/L   ALT 67 (H) 0 - 44 U/L   Alkaline Phosphatase 49 38 - 126 U/L   Total Bilirubin 0.6 <1.2 mg/dL   GFR, Estimated >40 >34 mL/min    Comment: (NOTE) Calculated using the CKD-EPI Creatinine Equation (2021)    Anion gap 18 (H) 5 - 15    Comment: Performed at [facility], [address], [place], [place] [address]  Ethanol     Status: Abnormal   Collection Time: 07/30/23  4:37 PM   Result Value Ref Range   Alcohol, Ethyl (B) 369 (HH) <10 mg/dL    Comment: CRITICAL RESULT CALLED TO, READ BACK BY AND VERIFIED WITH SHAW,S RN @ 1732 07/30/23 BY CHILDRESS,E (NOTE) Lowest detectable limit for serum alcohol is 10 mg/dL.  For medical purposes only. Performed at [facility], [address], [place], [place] [address]   cbc     Status: Abnormal   Collection Time: 07/30/23  4:37 PM  Result Value Ref Range   WBC 6.0 4.0 - 10.5 K/uL   RBC 4.19 (L) 4.22 - 5.81 MIL/uL   Hemoglobin 15.1 13.0 - 17.0 g/dL   HCT 56.4 33.2 - 95.1 %   MCV 103.8 (H) 80.0 - 100.0 fL   MCH 36.0 (H) 26.0 - 34.0 pg   MCHC 34.7 30.0 - 36.0 g/dL   RDW 88.4 16.6 - 06.3 %   Platelets 210 150 - 400 K/uL   nRBC 0.0 0.0 - 0.2 %    Comment: Performed at [facility], [address], [place], [place] [address]  Acetaminophen level     Status: Abnormal   Collection Time: 07/30/23  4:37 PM  Result Value Ref Range   Acetaminophen (Tylenol), Serum <10 (L) 10 - 30 ug/mL    Comment: (NOTE) Therapeutic concentrations vary significantly. A range of 10-30 ug/mL  may be an effective concentration for many patients. However, some  are best treated at concentrations outside of this range. Acetaminophen concentrations >150 ug/mL at 4 hours after ingestion  and >50 ug/mL at 12 hours after ingestion are often associated with  toxic reactions.  Performed at [facility], [address] [place], [place] [address]   Salicylate level     Status: Abnormal   Collection Time: 07/30/23  4:37 PM  Result Value Ref Range   Salicylate Lvl <7.0 (L) 7.0 - 30.0 mg/dL    Comment: Performed at [facility], [address], [place], [place] [address]  Lipase, blood     Status: None   Collection Time: 07/30/23  4:37 PM  Result Value Ref Range   Lipase 38 11 - 51 U/L    Comment: Performed at [facility], [address]  [place], [place] [address]  Ammonia     Status: None   Collection Time: 07/30/23  4:50 PM  Result Value Ref Range   Ammonia 18 9 - 35 umol/L    Comment: Performed at [facility], [address] [place], [place] [address]    Current Facility-Administered Medications  Medication Dose Route Frequency Provider Last Rate Last Admin   LORazepam (ATIVAN) injection 0-4 mg  0-4 mg Intravenous Q6H Melene Plan, DO   2 mg at 07/30/23 1725   Or   LORazepam (ATIVAN) tablet 0-4 mg  0-4 mg Oral Q6H Melene Plan, DO       [START ON 08/02/2023] LORazepam (ATIVAN) injection 0-4 mg  0-4 mg Intravenous Q12H Melene Plan, DO       Or   Melene Muller ON 08/02/2023] LORazepam (ATIVAN) tablet 0-4 mg  0-4 mg Oral Q12H Melene Plan, DO       thiamine (VITAMIN B1) tablet 100 mg  100 mg Oral Daily Melene Plan, DO       Or   thiamine (VITAMIN B1) injection 100 mg  100 mg Intravenous Daily Melene Plan, DO   100 mg at 07/30/23 1724   Current Outpatient Medications  Medication Sig Dispense Refill   b complex vitamins tablet Take 1 tablet by mouth daily.     B Complex-Biotin-FA (SUPER QUINTS B-50) TABS Take by mouth.     carboxymethylcellulose (REFRESH PLUS) 0.5 % SOLN Place 1 drop into both eyes 2 (two) times daily as needed (dry eyes).      cholecalciferol (VITAMIN D3) 25 MCG (1000 UNIT) tablet Take by mouth.     FLUoxetine (PROZAC) 20 MG capsule Take 20 mg by mouth daily.     gabapentin (NEURONTIN) 600 MG tablet Take 600 mg by mouth 3 (three) times daily.     hydrOXYzine (ATARAX) 25 MG tablet Take 25 mg by mouth at bedtime.     irbesartan (AVAPRO) 150 MG tablet Take 150 mg by mouth daily as needed (His BP was dropping really low so his MD told him to take if only if his DBP>90).     levothyroxine (SYNTHROID) 175 MCG tablet Take by mouth.     LORazepam (ATIVAN) 1 MG tablet Take 1 mg by mouth in the morning, at noon, and at bedtime.     Multiple Vitamin (MULTIVITAMIN WITH MINERALS) TABS tablet Take 1 tablet by mouth  daily. Men's 50+     Omega-3 Fatty Acids (FISH OIL) 1000 MG CAPS Take by mouth.     omeprazole (PRILOSEC) 40 MG capsule TAKE ONE CAPSULE BY MOUTH TWICE A DAY (Patient taking differently: 40 mg 2 (two) times daily as needed (heartburn).) 180 capsule 3   pregabalin (LYRICA) 150 MG capsule Take 1 capsule (150 mg total) by mouth 3 (three) times daily. No early refill 90 capsule 2   propranolol (INDERAL) 20 MG tablet Take 20 mg by mouth  2 (two) times daily.     QUEtiapine (SEROQUEL) 50 MG tablet Take 50 mg by mouth at bedtime.     rOPINIRole (REQUIP) 1 MG tablet Take 1 mg by mouth at bedtime.     rosuvastatin (CRESTOR) 20 MG tablet Take 20 mg by mouth daily.     sertraline (ZOLOFT) 100 MG tablet Take 2 tablets (200 mg total) by mouth daily. (Patient taking differently: Take 100 mg by mouth daily.) 30 tablet 1   thiamine (VITAMIN B1) 100 MG tablet Take 1 tablet by mouth daily.     traZODone (DESYREL) 100 MG tablet Take 100 mg by mouth at bedtime.     Facility-Administered Medications Ordered in Other Encounters  Medication Dose Route Frequency Provider Last Rate Last Admin   traZODone (DESYREL) tablet 100 mg  100 mg Oral QHS Lenard Lance, FNP        Musculoskeletal: Strength & Muscle Tone:  sitting in bed Gait & Station:  sitting in bed  Patient leans:  see above.   Psychiatric Specialty Exam: Presentation  General Appearance:  Casual  Eye Contact: Good  Speech: Clear and Coherent; Normal Rate  Speech Volume: Normal  Handedness: Right   Mood and Affect  Mood: Anxious  Affect: Congruent   Thought Process  Thought Processes: Coherent; Goal Directed  Descriptions of Associations:Intact  Orientation:Full (Time, Place and Person)  Thought Content:Logical  History of Schizophrenia/Schizoaffective disorder:No  Duration of Psychotic Symptoms:No data recorded Hallucinations:Hallucinations: Auditory Description of Auditory Hallucinations: hears perple conversing but  nobody is around.  Hears fiance's voice but she is not around.  Ideas of Reference:None  Suicidal Thoughts:Suicidal Thoughts: No  Homicidal Thoughts:Homicidal Thoughts: No   Sensorium  Memory: Immediate Fair; Recent Fair; Remote Fair  Judgment: Impaired  Insight: Shallow   Executive Functions  Concentration: Fair  Attention Span: Fair  Recall: Fair  Fund of Knowledge: Good  Language: Good   Psychomotor Activity  Psychomotor Activity: Psychomotor Activity: Tremor   Assets  Assets: Communication Skills; Desire for Improvement; Housing; Intimacy    Sleep  Sleep: Sleep: Good   Physical Exam: Physical Exam Vitals and nursing note reviewed.  Constitutional:      Appearance: Normal appearance.  HENT:     Nose: Nose normal.  Cardiovascular:     Rate and Rhythm: Normal rate and regular rhythm.  Pulmonary:     Effort: Pulmonary effort is normal.  Musculoskeletal:        General: Normal range of motion.  Skin:    General: Skin is dry.  Neurological:     Mental Status: He is alert and oriented to person, place, and time.  Psychiatric:        Attention and Perception: Attention normal. He perceives auditory hallucinations.        Mood and Affect: Mood is anxious.        Speech: Speech normal.        Behavior: Behavior normal. Behavior is cooperative.        Thought Content: Thought content normal.        Judgment: Judgment is impulsive.    Review of Systems  Constitutional: Negative.   HENT: Negative.    Eyes: Negative.   Respiratory: Negative.    Cardiovascular: Negative.   Gastrointestinal: Negative.   Genitourinary: Negative.   Musculoskeletal:  Positive for falls.  Skin: Negative.   Neurological:  Positive for tremors.  Endo/Heme/Allergies: Negative.   Psychiatric/Behavioral:  Positive for hallucinations. The patient is nervous/anxious.  Alcoholism   Blood pressure 123/86, pulse 66, temperature (!) 97.5 F (36.4 C),  temperature source Oral, resp. rate 18, height 6\' 2"  (1.88 m), weight 99.8 kg, SpO2 96%. Body mass index is 28.25 kg/m.  Medical Decision Making: Patient meets criteria for inpatient Detox treatment.  He is on CIWA Protocol with Ativan coverage.  He pl;ans to pursue anther Long term rehabilitation treatment.  We will fax out records to facilities seeking bed placement.  Disposition:  Admit, seek bed placement.  Earney Navy, NP-PMHNP-BC 07/30/2023 6:38 PM

## 2023-07-30 NOTE — ED Provider Notes (Addendum)
[facility] EMERGENCY DEPARTMENT AT [facility] Provider Note   CSN: [id number] Arrival date & time: 07/30/23  1550     History  Chief Complaint  Patient presents with   Alcohol Problem    [facility] is a 67 y.o. male.  67 yo M here with a chief complaints of wanting to stop drinking alcohol.  The patient unfortunately has been struggling with this for many years.  He has been in and out of rehab facilities with success but she usually has pretty quick relapse.  Was just in [facility] what sounds like and he thought that he had recovered and could go back to drinking occasionally.  Has been drinking fairly heavily today.  Family said he has been falling quite a bit over the past week or so.  He denies any significant areas of trauma.   Alcohol Problem       Home Medications Prior to Admission medications   Medication Sig Start Date End Date Taking? Authorizing Provider  b complex vitamins tablet Take 1 tablet by mouth daily.    [provider]  B Complex-Biotin-FA (SUPER QUINTS B-50) TABS Take by mouth. 09/03/19   [provider]  carboxymethylcellulose (REFRESH PLUS) 0.5 % SOLN Place 1 drop into both eyes 2 (two) times daily as needed (dry eyes).     [provider]  cholecalciferol (VITAMIN D3) 25 MCG (1000 UNIT) tablet Take by mouth. 09/03/19   [provider]  FLUoxetine (PROZAC) 20 MG capsule Take 20 mg by mouth daily. 07/27/23   [provider]  gabapentin (NEURONTIN) 600 MG tablet Take 600 mg by mouth 3 (three) times daily. 04/20/23   [provider]  hydrOXYzine (ATARAX) 25 MG tablet Take 25 mg by mouth at bedtime.    [provider]  irbesartan (AVAPRO) 150 MG tablet Take 150 mg by mouth daily as needed (His BP was dropping really low so his MD told him to take if only if his DBP>90). 03/26/21   [provider]  levothyroxine (SYNTHROID) 175 MCG tablet Take by mouth. 08/05/21   [provider]  LORazepam (ATIVAN) 1 MG tablet Take 1 mg by mouth in the morning, at noon, and at bedtime. 06/15/23   [provider]  Multiple Vitamin (MULTIVITAMIN WITH MINERALS) TABS tablet Take 1 tablet by mouth daily. Men's 50+    [provider]  Omega-3 Fatty Acids (FISH OIL) 1000 MG CAPS Take by mouth. 09/03/19   [provider]  omeprazole (PRILOSEC) 40 MG capsule TAKE ONE CAPSULE BY MOUTH TWICE A DAY Patient taking differently: 40 mg 2 (two) times daily as needed (heartburn). 12/29/21   [name], MD  pregabalin (LYRICA) 150 MG capsule Take 1 capsule (150 mg total) by mouth 3 (three) times daily. No early refill 02/09/23 07/30/23  [name], PA-C  propranolol (INDERAL) 20 MG tablet Take 20 mg by mouth 2 (two) times daily.    [provider]  QUEtiapine (SEROQUEL) 50 MG tablet Take 50 mg by mouth at bedtime. 05/25/23   [provider]  rOPINIRole (REQUIP) 1 MG tablet Take 1 mg by mouth at bedtime. 07/20/23   [provider]  rosuvastatin (CRESTOR) 20 MG tablet Take 20 mg by mouth daily.    [provider]  sertraline (ZOLOFT) 100 MG tablet Take 2 tablets (200 mg total) by mouth daily. Patient taking differently: Take 100 mg by mouth daily. 11/16/22   [name], MD  thiamine (  VITAMIN B1) 100 MG tablet Take 1 tablet by mouth daily.    [provider]  traZODone (DESYREL) 100 MG tablet Take 100 mg by mouth at bedtime.    [provider]      Allergies    Sulfa antibiotics    Review of Systems   Review of Systems  Physical Exam Updated Vital Signs BP 123/86   Pulse 66   Temp (!) 97.5 F (36.4 C) (Oral)   Resp 18   Ht 6\' 2"  (1.88 m)   Wt 99.8 kg   SpO2 96%   BMI 28.25 kg/m  Physical Exam Vitals and nursing note reviewed.  Constitutional:      Appearance: He is well-developed.  HENT:     Head: Normocephalic.     Comments: Small bruise to the right temporal area. Eyes:      Pupils: Pupils are equal, round, and reactive to light.  Neck:     Vascular: No JVD.  Cardiovascular:     Rate and Rhythm: Normal rate and regular rhythm.     Heart sounds: No murmur heard.    No friction rub. No gallop.  Pulmonary:     Effort: No respiratory distress.     Breath sounds: No wheezing.  Abdominal:     General: There is no distension.     Tenderness: There is no abdominal tenderness. There is no guarding or rebound.  Musculoskeletal:        General: Normal range of motion.     Cervical back: Normal range of motion and neck supple.     Comments: Failure large hematoma about the base of the L-spine.  No obvious midline spinal tenderness develops or deformities.  Pulse motor and sensation intact of bilateral lower extremities.  Reflexes are 2+ and equal there is no clonus.  Skin:    Coloration: Skin is not pale.     Findings: No rash.  Neurological:     Mental Status: He is alert and oriented to person, place, and time.  Psychiatric:        Behavior: Behavior normal.     ED Results / Procedures / Treatments   Labs (all labs ordered are listed, but only abnormal results are displayed) Labs Reviewed  COMPREHENSIVE METABOLIC PANEL - Abnormal; Notable for the following components:      Result Value   CO2 20 (*)    Glucose, Bld 67 (*)    Calcium 8.5 (*)    AST 60 (*)    ALT 67 (*)    Anion gap 18 (*)    All other components within normal limits  ETHANOL - Abnormal; Notable for the following components:   Alcohol, Ethyl (B) 369 (*)    All other components within normal limits  CBC - Abnormal; Notable for the following components:   RBC 4.19 (*)    MCV 103.8 (*)    MCH 36.0 (*)    All other components within normal limits  ACETAMINOPHEN LEVEL - Abnormal; Notable for the following components:   Acetaminophen (Tylenol), Serum <10 (*)    All other components within normal limits  SALICYLATE LEVEL - Abnormal; Notable for the following components:   Salicylate Lvl  <7.0 (*)    All other components within normal limits  AMMONIA  LIPASE, BLOOD  RAPID URINE DRUG SCREEN, HOSP PERFORMED    EKG None  Radiology DG Chest Port 1 View  Result Date: 07/30/2023 CLINICAL DATA:  psych clearance EXAM: PORTABLE CHEST 1 VIEW  COMPARISON:  CT chest 07/09/2022 FINDINGS: Patient is rotated. The heart and mediastinal contours are within normal limits. No focal consolidation. No pulmonary edema. No pleural effusion. No pneumothorax. No acute osseous abnormality. IMPRESSION: No active disease. Electronically Signed   By: Tish Frederickson M.D.   On: 07/30/2023 17:31   DG Lumbar Spine Complete  Result Date: 07/30/2023 CLINICAL DATA:  back pain post fall EXAM: LUMBAR SPINE - COMPLETE 4+ VIEW COMPARISON:  None Available. FINDINGS: Limited evaluation due to overlapping osseous structures and overlying soft tissues. There is no evidence of lumbar spine fracture. Alignment is normal. Intervertebral disc spaces are maintained. Atherosclerotic plaque of the aorta. IMPRESSION: 1. No acute displaced fracture or traumatic listhesis of the lumbar spine. 2.  Aortic Atherosclerosis (ICD10-I70.0). Electronically Signed   By: Tish Frederickson M.D.   On: 07/30/2023 17:31   CT Head Wo Contrast  Result Date: 07/30/2023 CLINICAL DATA:  Head trauma, minor (Age >= 65y) Patient is tearful at time of triage. Patient reports he is an alcoholic, drinks a 2-3 pints of vodka and 1 bottle of wine a day. Wants to detox. EXAM: CT HEAD WITHOUT CONTRAST TECHNIQUE: Contiguous axial images were obtained from the base of the skull through the vertex without intravenous contrast. RADIATION DOSE REDUCTION: This exam was performed according to the departmental dose-optimization program which includes automated exposure control, adjustment of the mA and/or kV according to patient size and/or use of iterative reconstruction technique. COMPARISON:  None Available. FINDINGS: Brain: Patchy and confluent areas of decreased  attenuation are noted throughout the deep and periventricular white matter of the cerebral hemispheres bilaterally, compatible with chronic microvascular ischemic disease. No evidence of large-territorial acute infarction. No parenchymal hemorrhage. No mass lesion. No extra-axial collection. No mass effect or midline shift. No hydrocephalus. Basilar cisterns are patent. Vascular: No hyperdense vessel. Skull: No acute fracture or focal lesion. Sinuses/Orbits: Paranasal sinuses and mastoid air cells are clear. The orbits are unremarkable. Other: None. IMPRESSION: No acute intracranial abnormality. Electronically Signed   By: Tish Frederickson M.D.   On: 07/30/2023 17:05    Procedures Procedures    Medications Ordered in ED Medications  LORazepam (ATIVAN) injection 0-4 mg (2 mg Intravenous Given 07/30/23 1725)    Or  LORazepam (ATIVAN) tablet 0-4 mg ( Oral See Alternative 07/30/23 1725)  LORazepam (ATIVAN) injection 0-4 mg (has no administration in time range)    Or  LORazepam (ATIVAN) tablet 0-4 mg (has no administration in time range)  thiamine (VITAMIN B1) tablet 100 mg ( Oral See Alternative 07/30/23 1724)    Or  thiamine (VITAMIN B1) injection 100 mg (100 mg Intravenous Given 07/30/23 1724)    ED Course/ Medical Decision Making/ A&P                                 Medical Decision Making Amount and/or Complexity of Data Reviewed Labs: ordered. Radiology: ordered.  Risk OTC drugs. Prescription drug management.   67 yo M with a chief complaints of desire to quit drinking alcohol.  On my record review the patient unfortunately struggled with this for many years.  Has had multiple visits to the ER and has gone through multiple rehab programs.  He tells me that he was recently in a rehab program at [facility] and she started drinking again.  Family is worried about his safety at home since he has had multiple falls due to heavy drinking.  He  is clinically intoxicated on my exam.   Will have TTS evaluate for possible rehab placement.  Patient's labs have resulted consistent with alcohol intoxication.  No significant LFT elevation lipase is normal ammonia levels negative.  CT of the head is negative for acute intracranial hemorrhage on my independent interpretation.  Chest x-ray independently interpreted by me without focal effusion or pneumothorax.  I feel he is clear for TTS evaluation.   I was notified by nursing that the patient had fallen out of bed.  Had struck his head on the ground reportedly pretty significantly.  He has no complaints.  As he is intoxicated and over 65 a repeat head CT was ordered.  Negative.  I still feel he is medically clear.  The patients results and plan were reviewed and discussed.   Any x-rays performed were independently reviewed by myself.   Differential diagnosis were considered with the presenting HPI.  Medications  LORazepam (ATIVAN) injection 0-4 mg (2 mg Intravenous Given 07/30/23 1725)    Or  LORazepam (ATIVAN) tablet 0-4 mg ( Oral See Alternative 07/30/23 1725)  LORazepam (ATIVAN) injection 0-4 mg (has no administration in time range)    Or  LORazepam (ATIVAN) tablet 0-4 mg (has no administration in time range)  thiamine (VITAMIN B1) tablet 100 mg ( Oral See Alternative 07/30/23 1724)    Or  thiamine (VITAMIN B1) injection 100 mg (100 mg Intravenous Given 07/30/23 1724)    Vitals:   07/30/23 1605 07/30/23 1630  BP: 114/83 123/86  Pulse: 69 66  Resp: 18 18  Temp: (!) 97.5 F (36.4 C)   TempSrc: Oral   SpO2: 99% 96%  Weight: 99.8 kg   Height: 6\' 2"  (1.88 m)     Final diagnoses:  Alcohol dependence with uncomplicated intoxication (HCC)    Admission/ observation were discussed with the admitting physician, patient and/or family and they are comfortable with the plan.          Final Clinical Impression(s) / ED Diagnoses Final diagnoses:  Alcohol dependence with uncomplicated intoxication [facility])    Rx  / DC Orders ED Discharge Orders     None         Melene Plan, DO 07/30/23 1810    Melene Plan, DO 07/30/23 2119

## 2023-07-30 NOTE — ED Notes (Signed)
Pt heard falling out of bed. When checking on pt, he was lying on his left side on the floor, says he hit the back of his head. Pt assisted back to bed, bed alarm activated, side rails up. MD notified.

## 2023-07-31 MED ORDER — THIAMINE MONONITRATE 100 MG PO TABS
100.0000 mg | ORAL_TABLET | Freq: Every day | ORAL | Status: DC
  Administered 2023-07-31: 100 mg via ORAL
  Filled 2023-07-31: qty 1

## 2023-07-31 MED ORDER — PROPRANOLOL HCL 20 MG PO TABS
20.0000 mg | ORAL_TABLET | Freq: Two times a day (BID) | ORAL | Status: DC
  Administered 2023-07-31 – 2023-08-03 (×4): 20 mg via ORAL
  Filled 2023-07-31 (×8): qty 1

## 2023-07-31 MED ORDER — PHENOBARBITAL SODIUM 130 MG/ML IJ SOLN
65.0000 mg | Freq: Once | INTRAMUSCULAR | Status: AC
  Administered 2023-07-31: 65 mg via INTRAMUSCULAR
  Filled 2023-07-31: qty 1

## 2023-07-31 MED ORDER — PANTOPRAZOLE SODIUM 40 MG PO TBEC
40.0000 mg | DELAYED_RELEASE_TABLET | Freq: Every day | ORAL | Status: DC
  Administered 2023-07-31 – 2023-08-03 (×3): 40 mg via ORAL
  Filled 2023-07-31 (×3): qty 1

## 2023-07-31 MED ORDER — LEVOTHYROXINE SODIUM 25 MCG PO TABS
175.0000 ug | ORAL_TABLET | Freq: Every day | ORAL | Status: DC
  Administered 2023-07-31 – 2023-08-03 (×3): 175 ug via ORAL
  Filled 2023-07-31: qty 1
  Filled 2023-07-31 (×2): qty 3
  Filled 2023-07-31: qty 1

## 2023-07-31 MED ORDER — IRBESARTAN 150 MG PO TABS: 150.0000 mg | ORAL_TABLET | Freq: Every day | ORAL | Status: DC | PRN

## 2023-07-31 MED ORDER — ROPINIROLE HCL 1 MG PO TABS
1.0000 mg | ORAL_TABLET | Freq: Every day | ORAL | Status: DC
  Administered 2023-07-31 – 2023-08-02 (×2): 1 mg via ORAL
  Filled 2023-07-31 (×4): qty 1

## 2023-07-31 MED ORDER — ADULT MULTIVITAMIN W/MINERALS CH
1.0000 | ORAL_TABLET | Freq: Every day | ORAL | Status: DC
  Administered 2023-07-31 – 2023-08-03 (×3): 1 via ORAL
  Filled 2023-07-31 (×4): qty 1

## 2023-07-31 MED ORDER — FLUOXETINE HCL 10 MG PO CAPS
20.0000 mg | ORAL_CAPSULE | Freq: Every day | ORAL | Status: DC
  Administered 2023-07-31 – 2023-08-03 (×3): 20 mg via ORAL
  Filled 2023-07-31: qty 1
  Filled 2023-07-31 (×2): qty 2
  Filled 2023-07-31: qty 1

## 2023-07-31 MED ORDER — ROSUVASTATIN CALCIUM 20 MG PO TABS
20.0000 mg | ORAL_TABLET | Freq: Every day | ORAL | Status: DC
  Administered 2023-07-31: 20 mg via ORAL
  Filled 2023-07-31: qty 1

## 2023-07-31 MED ORDER — QUETIAPINE FUMARATE 50 MG PO TABS
50.0000 mg | ORAL_TABLET | Freq: Every day | ORAL | Status: DC
  Administered 2023-07-31 – 2023-08-02 (×2): 50 mg via ORAL
  Filled 2023-07-31 (×3): qty 1

## 2023-07-31 MED ORDER — PHENOBARBITAL SODIUM 65 MG/ML IJ SOLN: 65.0000 mg | Freq: Once | INTRAMUSCULAR | Status: DC

## 2023-07-31 NOTE — Progress Notes (Signed)
Patient has been denied by [facility] due to no appropriate beds available. Patient meets [facility] inpatient criteria per Dahlia Byes, NP. Patient has been faxed out to the following facilities:    [facility]  [address] [place]., [place] [address] [phone]  [facility]  [address] [place] [address] [phone]  [facility] [address]  [address] [place] [address] [phone]  [facility] Patient Placement  [facility], [place] [phone]  [facility]  [address] [place], [place] [address] [phone]  [facility]-Adult  [address] [place] [address] [phone]  [facility]  [address] [place] [address] [phone]  [facility] [address]  [place] [address] [phone]  [facility]  [address] [place] [address] [phone]  [facility]  [address] [place], [place] [address] [phone]  [facility]  [address] [place] [address] [phone]  [facility] EFAX  [address] [place] [phone]  [facility]  [address] [place] [address] [phone]  [facility]  [address] [place], [place] [address] [phone]  [facility]  [address], [place] [address] [phone]  [facility]  [address], [place] [address] [phone]  [facility]  [address], [place] [address] [phone]    Damita Dunnings, MSW, LCSW-A  10:09 AM 07/31/2023

## 2023-07-31 NOTE — ED Notes (Signed)
Pt presents restlessness and pacing. Pt continued to ask about the time and if he can make a phone call. When pt is informed he has to wait until morning to make a call, he is calm and cooperative and walks back to his room.

## 2023-07-31 NOTE — ED Notes (Addendum)
Pt ambulated to desk asking to call his daughter. Pt informed it was after phone time ended for the night, but he would be allowed to make a call in the morning. Pt remained calm and cooperative but appeared restless and anxious. Pt then asked if he could shower. Pt given hygiene products and a change of scrubs.

## 2023-07-31 NOTE — ED Notes (Signed)
Pt asked for something for tremors RN made aware

## 2023-07-31 NOTE — ED Notes (Signed)
Pt initially planned to be moved to SAPU. Pt reports he wears a CPAP at night which is not allowed in that specific unit. When pt found out he was adamant about being moved back to the SAPU. Pt had stated he would just leave. EDP was notified and talked with pt. and this RN heard pt states he occasionally goes nights without wearing the CPAP. Dr. Wallace Cullens stated it was okay for the pt to be off his CPAP if that is what the pt wanted.

## 2023-08-01 ENCOUNTER — Encounter (HOSPITAL_COMMUNITY): Payer: Self-pay | Admitting: Family Medicine

## 2023-08-01 DIAGNOSIS — Z79890 Hormone replacement therapy (postmenopausal): Secondary | ICD-10-CM | POA: Diagnosis not present

## 2023-08-01 DIAGNOSIS — E876 Hypokalemia: Secondary | ICD-10-CM | POA: Diagnosis present

## 2023-08-01 DIAGNOSIS — I700 Atherosclerosis of aorta: Secondary | ICD-10-CM | POA: Insufficient documentation

## 2023-08-01 DIAGNOSIS — G2581 Restless legs syndrome: Secondary | ICD-10-CM | POA: Diagnosis present

## 2023-08-01 DIAGNOSIS — F10939 Alcohol use, unspecified with withdrawal, unspecified: Secondary | ICD-10-CM | POA: Diagnosis present

## 2023-08-01 DIAGNOSIS — I2510 Atherosclerotic heart disease of native coronary artery without angina pectoris: Secondary | ICD-10-CM | POA: Diagnosis present

## 2023-08-01 DIAGNOSIS — F1020 Alcohol dependence, uncomplicated: Secondary | ICD-10-CM | POA: Diagnosis not present

## 2023-08-01 DIAGNOSIS — F10932 Alcohol use, unspecified with withdrawal with perceptual disturbance: Secondary | ICD-10-CM | POA: Diagnosis not present

## 2023-08-01 DIAGNOSIS — K219 Gastro-esophageal reflux disease without esophagitis: Secondary | ICD-10-CM | POA: Diagnosis present

## 2023-08-01 DIAGNOSIS — Y908 Blood alcohol level of 240 mg/100 ml or more: Secondary | ICD-10-CM | POA: Diagnosis present

## 2023-08-01 DIAGNOSIS — G9341 Metabolic encephalopathy: Secondary | ICD-10-CM | POA: Diagnosis not present

## 2023-08-01 DIAGNOSIS — E785 Hyperlipidemia, unspecified: Secondary | ICD-10-CM | POA: Diagnosis present

## 2023-08-01 DIAGNOSIS — Z8249 Family history of ischemic heart disease and other diseases of the circulatory system: Secondary | ICD-10-CM | POA: Diagnosis not present

## 2023-08-01 DIAGNOSIS — K2270 Barrett's esophagus without dysplasia: Secondary | ICD-10-CM | POA: Diagnosis present

## 2023-08-01 DIAGNOSIS — G4733 Obstructive sleep apnea (adult) (pediatric): Secondary | ICD-10-CM | POA: Diagnosis present

## 2023-08-01 DIAGNOSIS — Z79899 Other long term (current) drug therapy: Secondary | ICD-10-CM | POA: Diagnosis not present

## 2023-08-01 DIAGNOSIS — F10220 Alcohol dependence with intoxication, uncomplicated: Secondary | ICD-10-CM | POA: Diagnosis present

## 2023-08-01 DIAGNOSIS — F329 Major depressive disorder, single episode, unspecified: Secondary | ICD-10-CM | POA: Diagnosis present

## 2023-08-01 DIAGNOSIS — Z781 Physical restraint status: Secondary | ICD-10-CM | POA: Diagnosis not present

## 2023-08-01 DIAGNOSIS — F10231 Alcohol dependence with withdrawal delirium: Secondary | ICD-10-CM | POA: Diagnosis present

## 2023-08-01 DIAGNOSIS — G4730 Sleep apnea, unspecified: Secondary | ICD-10-CM | POA: Diagnosis present

## 2023-08-01 DIAGNOSIS — I959 Hypotension, unspecified: Secondary | ICD-10-CM | POA: Diagnosis present

## 2023-08-01 DIAGNOSIS — E890 Postprocedural hypothyroidism: Secondary | ICD-10-CM | POA: Diagnosis present

## 2023-08-01 DIAGNOSIS — F411 Generalized anxiety disorder: Secondary | ICD-10-CM | POA: Diagnosis present

## 2023-08-01 DIAGNOSIS — F17210 Nicotine dependence, cigarettes, uncomplicated: Secondary | ICD-10-CM | POA: Diagnosis present

## 2023-08-01 DIAGNOSIS — Z5329 Procedure and treatment not carried out because of patient's decision for other reasons: Secondary | ICD-10-CM | POA: Diagnosis present

## 2023-08-01 DIAGNOSIS — I10 Essential (primary) hypertension: Secondary | ICD-10-CM | POA: Diagnosis present

## 2023-08-01 DIAGNOSIS — K709 Alcoholic liver disease, unspecified: Secondary | ICD-10-CM | POA: Diagnosis present

## 2023-08-01 DIAGNOSIS — R339 Retention of urine, unspecified: Secondary | ICD-10-CM | POA: Diagnosis present

## 2023-08-01 DIAGNOSIS — F10931 Alcohol use, unspecified with withdrawal delirium: Secondary | ICD-10-CM | POA: Diagnosis present

## 2023-08-01 DIAGNOSIS — F32A Depression, unspecified: Secondary | ICD-10-CM

## 2023-08-01 HISTORY — DX: Depression, unspecified: F32.A

## 2023-08-01 LAB — COMPREHENSIVE METABOLIC PANEL
ALT: 53 U/L — ABNORMAL HIGH (ref 0–44)
AST: 56 U/L — ABNORMAL HIGH (ref 15–41)
Albumin: 4.9 g/dL (ref 3.5–5.0)
Alkaline Phosphatase: 48 U/L (ref 38–126)
Anion gap: 15 (ref 5–15)
BUN: 19 mg/dL (ref 8–23)
CO2: 20 mmol/L — ABNORMAL LOW (ref 22–32)
Calcium: 9.2 mg/dL (ref 8.9–10.3)
Chloride: 100 mmol/L (ref 98–111)
Creatinine, Ser: 0.99 mg/dL (ref 0.61–1.24)
GFR, Estimated: >60 mL/min (ref >60)
Glucose, Bld: 128 mg/dL — ABNORMAL HIGH (ref 70–99)
Potassium: 3.5 mmol/L (ref 3.5–5.1)
Sodium: 135 mmol/L (ref 135–145)
Total Bilirubin: 1.4 mg/dL — ABNORMAL HIGH (ref <1.2)
Total Protein: 8 g/dL (ref 6.5–8.1)

## 2023-08-01 LAB — PHOSPHORUS: Phosphorus: 2.2 mg/dL — ABNORMAL LOW (ref 2.5–4.6)

## 2023-08-01 LAB — CBC
HCT: 40.2 % (ref 39.0–52.0)
Hemoglobin: 14.7 g/dL (ref 13.0–17.0)
MCH: 36.7 pg — ABNORMAL HIGH (ref 26.0–34.0)
MCHC: 36.6 g/dL — ABNORMAL HIGH (ref 30.0–36.0)
MCV: 100.2 fL — ABNORMAL HIGH (ref 80.0–100.0)
Platelets: 201 10*3/uL (ref 150–400)
RBC: 4.01 MIL/uL — ABNORMAL LOW (ref 4.22–5.81)
RDW: 13 % (ref 11.5–15.5)
WBC: 6.4 10*3/uL (ref 4.0–10.5)
nRBC: 0 % (ref 0.0–0.2)

## 2023-08-01 LAB — GLUCOSE, CAPILLARY
Glucose-Capillary: 137 mg/dL — ABNORMAL HIGH (ref 70–99)
Glucose-Capillary: 126 mg/dL — ABNORMAL HIGH (ref 70–99)
Glucose-Capillary: 131 mg/dL — ABNORMAL HIGH (ref 70–99)

## 2023-08-01 LAB — MRSA NEXT GEN BY PCR, NASAL: MRSA by PCR Next Gen: NOT DETECTED

## 2023-08-01 LAB — ETHANOL: Alcohol, Ethyl (B): <10 mg/dL (ref <10)

## 2023-08-01 LAB — MAGNESIUM: Magnesium: 1.7 mg/dL (ref 1.7–2.4)

## 2023-08-01 MED ORDER — PHENOBARBITAL SODIUM 130 MG/ML IJ SOLN
65.0000 mg | Freq: Once | INTRAMUSCULAR | Status: AC
  Administered 2023-08-01: 65 mg via INTRAVENOUS
  Filled 2023-08-01: qty 1

## 2023-08-01 MED ORDER — ORAL CARE MOUTH RINSE
15.0000 mL | OROMUCOSAL | Status: DC
  Administered 2023-08-01 – 2023-08-03 (×6): 15 mL via OROMUCOSAL

## 2023-08-01 MED ORDER — LORAZEPAM 2 MG/ML IJ SOLN
1.0000 mg | INTRAMUSCULAR | Status: DC | PRN
  Administered 2023-08-01 (×2): 4 mg via INTRAVENOUS
  Administered 2023-08-01: 2 mg via INTRAVENOUS
  Filled 2023-08-01: qty 2
  Filled 2023-08-01: qty 1
  Filled 2023-08-01: qty 2

## 2023-08-01 MED ORDER — MAGNESIUM SULFATE 2 GM/50ML IV SOLN
2.0000 g | Freq: Once | INTRAVENOUS | Status: AC
  Administered 2023-08-01: 2 g via INTRAVENOUS
  Filled 2023-08-01: qty 50

## 2023-08-01 MED ORDER — LORAZEPAM 1 MG PO TABS: 0.0000 mg | ORAL_TABLET | Freq: Three times a day (TID) | ORAL | Status: DC

## 2023-08-01 MED ORDER — ENOXAPARIN SODIUM 40 MG/0.4ML IJ SOSY
40.0000 mg | PREFILLED_SYRINGE | INTRAMUSCULAR | Status: DC
  Administered 2023-08-02 – 2023-08-03 (×2): 40 mg via SUBCUTANEOUS
  Filled 2023-08-01 (×2): qty 0.4

## 2023-08-01 MED ORDER — ACETAMINOPHEN 325 MG PO TABS
650.0000 mg | ORAL_TABLET | Freq: Four times a day (QID) | ORAL | Status: DC | PRN
  Administered 2023-08-03: 650 mg via ORAL
  Filled 2023-08-01: qty 2

## 2023-08-01 MED ORDER — PHENOBARBITAL 64.8 MG PO TABS: 64.8000 mg | ORAL_TABLET | Freq: Two times a day (BID) | ORAL | Status: DC

## 2023-08-01 MED ORDER — DEXMEDETOMIDINE HCL IN NACL 400 MCG/100ML IV SOLN
0.0000 ug/kg/h | INTRAVENOUS | Status: DC
  Administered 2023-08-01: 0.9 ug/kg/h via INTRAVENOUS
  Administered 2023-08-01: 0.4 ug/kg/h via INTRAVENOUS
  Administered 2023-08-01 – 2023-08-02 (×3): 0.8 ug/kg/h via INTRAVENOUS
  Filled 2023-08-01 (×5): qty 100

## 2023-08-01 MED ORDER — ORAL CARE MOUTH RINSE: 15.0000 mL | OROMUCOSAL | Status: DC | PRN

## 2023-08-01 MED ORDER — THIAMINE HCL 100 MG/ML IJ SOLN
500.0000 mg | Freq: Three times a day (TID) | INTRAVENOUS | Status: DC
  Administered 2023-08-01 – 2023-08-03 (×7): 500 mg via INTRAVENOUS
  Filled 2023-08-01 (×9): qty 5

## 2023-08-01 MED ORDER — THIAMINE MONONITRATE 100 MG PO TABS: 100.0000 mg | ORAL_TABLET | Freq: Every day | ORAL | Status: DC

## 2023-08-01 MED ORDER — HALOPERIDOL LACTATE 5 MG/ML IJ SOLN
5.0000 mg | Freq: Four times a day (QID) | INTRAMUSCULAR | Status: DC | PRN
  Administered 2023-08-01: 5 mg via INTRAVENOUS
  Filled 2023-08-01: qty 1

## 2023-08-01 MED ORDER — LORAZEPAM 1 MG PO TABS
0.0000 mg | ORAL_TABLET | ORAL | Status: DC
  Administered 2023-08-02 (×2): 1 mg via ORAL
  Filled 2023-08-01 (×2): qty 1

## 2023-08-01 MED ORDER — ACETAMINOPHEN 650 MG RE SUPP: 650.0000 mg | Freq: Four times a day (QID) | RECTAL | Status: DC | PRN

## 2023-08-01 MED ORDER — ONDANSETRON HCL 4 MG/2ML IJ SOLN: 4.0000 mg | Freq: Four times a day (QID) | INTRAMUSCULAR | Status: DC | PRN

## 2023-08-01 MED ORDER — ONDANSETRON HCL 4 MG PO TABS: 4.0000 mg | ORAL_TABLET | Freq: Four times a day (QID) | ORAL | Status: DC | PRN

## 2023-08-01 MED ORDER — SODIUM CHLORIDE 0.9 % IV SOLN
260.0000 mg | Freq: Once | INTRAVENOUS | Status: AC
  Administered 2023-08-01: 260 mg via INTRAVENOUS
  Filled 2023-08-01: qty 2

## 2023-08-01 MED ORDER — CHLORHEXIDINE GLUCONATE CLOTH 2 % EX PADS
6.0000 | MEDICATED_PAD | Freq: Every day | CUTANEOUS | Status: DC
Start: 2023-08-01 — End: 2023-08-03
  Administered 2023-08-01 – 2023-08-03 (×3): 6 via TOPICAL

## 2023-08-01 MED ORDER — POTASSIUM CHLORIDE 10 MEQ/100ML IV SOLN: 10.0000 meq | INTRAVENOUS | Status: DC

## 2023-08-01 MED ORDER — PHENOBARBITAL 16.2 MG PO TABS: 16.2000 mg | ORAL_TABLET | Freq: Two times a day (BID) | ORAL | Status: DC

## 2023-08-01 MED ORDER — LORAZEPAM 1 MG PO TABS
2.0000 mg | ORAL_TABLET | Freq: Once | ORAL | Status: AC
  Administered 2023-08-01: 2 mg via ORAL
  Filled 2023-08-01: qty 2

## 2023-08-01 MED ORDER — LORAZEPAM 1 MG PO TABS: 1.0000 mg | ORAL_TABLET | ORAL | Status: DC | PRN

## 2023-08-01 MED ORDER — LORAZEPAM 2 MG/ML IJ SOLN
2.0000 mg | Freq: Once | INTRAMUSCULAR | Status: AC
  Administered 2023-08-01: 2 mg via INTRAVENOUS

## 2023-08-01 MED ORDER — SODIUM PHOSPHATES 45 MMOLE/15ML IV SOLN
30.0000 mmol | Freq: Once | INTRAVENOUS | Status: AC
  Administered 2023-08-01: 30 mmol via INTRAVENOUS
  Filled 2023-08-01: qty 10

## 2023-08-01 MED ORDER — POTASSIUM CHLORIDE 10 MEQ/100ML IV SOLN
10.0000 meq | INTRAVENOUS | Status: AC
  Administered 2023-08-01 (×4): 10 meq via INTRAVENOUS
  Filled 2023-08-01 (×4): qty 100

## 2023-08-01 MED ORDER — LORAZEPAM 2 MG/ML IJ SOLN
2.0000 mg | Freq: Once | INTRAMUSCULAR | Status: AC
  Administered 2023-08-01: 2 mg via INTRAVENOUS
  Filled 2023-08-01: qty 1

## 2023-08-01 MED ORDER — LABETALOL HCL 5 MG/ML IV SOLN: 20.0000 mg | INTRAVENOUS | Status: DC | PRN

## 2023-08-01 MED ORDER — PHENOBARBITAL 32.4 MG PO TABS: 32.4000 mg | ORAL_TABLET | Freq: Two times a day (BID) | ORAL | Status: DC

## 2023-08-01 MED ORDER — SODIUM PHOSPHATES 45 MMOLE/15ML IV SOLN
30.0000 mmol | Freq: Once | INTRAVENOUS | Status: DC
  Filled 2023-08-01: qty 10

## 2023-08-01 NOTE — Consult Note (Signed)
NAME:  [facility], MRN:  [id number], DOB:  [birth year], LOS: 0 ADMISSION DATE:  07/30/2023, CONSULTATION DATE:  08/01/23 REFERRING MD:  Robb Matar, CHIEF COMPLAINT:  AMS   History of Present Illness:  67 year old man w/ hx of alcoholism presenting with severe DTs refractory to ativan, phenobarb, and haldol.  Now in 4 point restraints.  PCCM consulted for refractory agitation.  Patient is pretty paranoid and only oriented to self.  To start precedex soon.  Pertinent  Medical History  Alcohol abuse with recurrent admissions for withdrawal  Significant Hospital Events: Including procedures, antibiotic start and stop dates in addition to other pertinent events     Interim History / Subjective:  Consult vs. admit  Objective   Blood pressure (!) 144/98, pulse 78, temperature 98.2 F (36.8 C), temperature source Oral, resp. rate 16, height 6\' 2"  (1.88 m), weight 99.8 kg, SpO2 97%.       No intake or output data in the 24 hours ending 08/01/23 0935 Filed Weights   07/30/23 1605  Weight: 99.8 kg    Examination: General: agitated trying to get out of bed HENT: malampatti 3, MMM Lungs: Clear, mildly tachypneic Cardiovascular: tachy, ext warm Abdomen: soft, +BS Extremities: no edema, moving/writhing Neuro: nonfocal, AO to self Skin: no rashes  Labs notable for Normal renal function Mild NAGMA Macrocytic anemia Mild transaminitis Hypo-Phos/K/Mg Alcohol 369 07/30/23  Resolved Hospital Problem list   N/A  Assessment & Plan:  Metabolic encephalopathy related to recurrent Dts- just got out of rehab; no seizures yet Lab abnormalities listed above Hx anxiety/depression- home meds on hold until able to take PO  - Load with phenobarb - Okay to do PRN ativan on top - Precedex as bridge - Monitor airway in ICU for tonight - Thiamine/folate - Replete K/Mg/Phos  Best Practice (right click and "Reselect all SmartList Selections" daily)   Diet/type: Regular consistency (see  orders) DVT prophylaxis: lovenox  Pressure ulcer(s): not present on admission  GI prophylaxis: PPI PTA Lines: N/A Foley:  N/A Code Status:  full code Last date of multidisciplinary goals of care discussion [pending]  Labs   CBC: Recent Labs  Lab 07/30/23 1637 08/01/23 0830  WBC 6.0 6.4  HGB 15.1 14.7  HCT 43.5 40.2  MCV 103.8* 100.2*  PLT 210 201    Basic Metabolic Panel: Recent Labs  Lab 07/30/23 1637  NA 140  K 3.5  CL 102  CO2 20*  GLUCOSE 67*  BUN 17  CREATININE 1.03  CALCIUM 8.5*   GFR: Estimated Creatinine Clearance: 87.8 mL/min (by C-G formula based on SCr of 1.03 mg/dL). Recent Labs  Lab 07/30/23 1637 08/01/23 0830  WBC 6.0 6.4    Liver Function Tests: Recent Labs  Lab 07/30/23 1637  AST 60*  ALT 67*  ALKPHOS 49  BILITOT 0.6  PROT 8.0  ALBUMIN 4.7   Recent Labs  Lab 07/30/23 1637  LIPASE 38   Recent Labs  Lab 07/30/23 1650  AMMONIA 18    ABG No results found for: "PHART", "PCO2ART", "PO2ART", "HCO3", "TCO2", "ACIDBASEDEF", "O2SAT"   Coagulation Profile: No results for input(s): "INR", "PROTIME" in the last 168 hours.  Cardiac Enzymes: No results for input(s): "CKTOTAL", "CKMB", "CKMBINDEX", "TROPONINI" in the last 168 hours.  HbA1C: No results found for: "HGBA1C"  CBG: No results for input(s): "GLUCAP" in the last 168 hours.  Review of Systems:   Too encephalopathic  Past Medical History:  He,  has a past medical history of Alcohol abuse,  Anxiety, Cancer (HCC), Dupuytren contracture (12/23/2017), GERD (gastroesophageal reflux disease), Hyperlipidemia, Hypertension, PUD (peptic ulcer disease) (10/01/2011), SBO (small bowel obstruction) (HCC) (12/13/2020), Sleep apnea, and Thyroid disease.   Surgical History:   Past Surgical History:  Procedure Laterality Date   COLONOSCOPY     COLONOSCOPY  04/2016   [place] polyps   FASCIOTOMY Right 10/2019   LAPAROTOMY N/A 12/13/2020   Procedure: EXPLORATORY LAPAROTOMY, LYSIS  OF ADHESIONS;  Surgeon: Romie Levee, MD;  Location: [facility] ORS;  Service: General;  Laterality: N/A;   SHOULDER ARTHROSCOPY WITH ROTATOR CUFF REPAIR Right 03/01/2019   Procedure: Right shoulder arthroscopy, subacromial decompression, distal clavicle resection, rotator cuff repair;  Surgeon: Francena Hanly, MD;  Location: [facility] ORS;  Service: Orthopedics;  Laterality: Right;   SMALL INTESTINE SURGERY  12/13/2020   SBO   THYROIDECTOMY     1987   UPPER GASTROINTESTINAL ENDOSCOPY       Social History:   reports that he quit smoking about 4 years ago. His smoking use included cigarettes. He started smoking about 8 years ago. He has a 1 pack-year smoking history. He has never used smokeless tobacco. He reports that he does not currently use alcohol. He reports that he does not use drugs.   Family History:  His family history includes Anxiety disorder in his daughter and mother; Cancer in his maternal grandmother, mother, and paternal grandmother; Diabetes in his father; Heart disease in his paternal grandfather; Hyperlipidemia in his father; Hypertension in his father. There is no history of Colon cancer, Esophageal cancer, Stomach cancer, Rectal cancer, or Colon polyps.   Allergies Allergies  Allergen Reactions   Sulfa Antibiotics Rash    Fixed based skin reaction     Home Medications  Prior to Admission medications   Medication Sig Start Date End Date Taking? Authorizing Provider  b complex vitamins tablet Take 1 tablet by mouth daily.   Yes [provider]  carboxymethylcellulose (REFRESH PLUS) 0.5 % SOLN Place 1 drop into both eyes 2 (two) times daily as needed (dry eyes).    Yes [provider]  cholecalciferol (VITAMIN D3) 25 MCG (1000 UNIT) tablet Take by mouth. 09/03/19  Yes [provider]  FLUoxetine (PROZAC) 20 MG capsule Take 20 mg by mouth daily. 07/27/23  Yes [provider]  irbesartan (AVAPRO) 150 MG tablet Take 150 mg by mouth daily as needed (His  BP was dropping really low so his MD told him to take if only if his DBP>90). 03/26/21  Yes [provider]  levothyroxine (SYNTHROID) 175 MCG tablet Take by mouth. 08/05/21  Yes [provider]  LORazepam (ATIVAN) 1 MG tablet Take 1 mg by mouth as needed for anxiety. 06/15/23  Yes [provider]  Multiple Vitamin (MULTIVITAMIN WITH MINERALS) TABS tablet Take 1 tablet by mouth daily. Men's 50+   Yes [provider]  Omega-3 Fatty Acids (FISH OIL) 1000 MG CAPS Take by mouth. 09/03/19  Yes [provider]  omeprazole (PRILOSEC) 40 MG capsule TAKE ONE CAPSULE BY MOUTH TWICE A DAY Patient taking differently: 40 mg 2 (two) times daily as needed (heartburn). 12/29/21  Yes [name], MD  pregabalin (LYRICA) 150 MG capsule Take 1 capsule (150 mg total) by mouth 3 (three) times daily. No early refill 02/09/23 07/31/23 Yes [name], PA-C  propranolol (INDERAL) 20 MG tablet Take 20 mg by mouth 2 (two) times daily.   Yes [provider]  QUEtiapine (SEROQUEL) 50 MG tablet Take 50 mg by  mouth at bedtime. 05/25/23  Yes [provider]  rOPINIRole (REQUIP) 1 MG tablet Take 1 mg by mouth at bedtime. 07/20/23  Yes [provider]  rosuvastatin (CRESTOR) 20 MG tablet Take 20 mg by mouth daily.   Yes [provider]  thiamine (VITAMIN B1) 100 MG tablet Take 1 tablet by mouth daily.   Yes [provider]  B Complex-Biotin-FA (SUPER QUINTS B-50) TABS Take by mouth. Patient not taking: Reported on 07/31/2023 09/03/19   [provider]  gabapentin (NEURONTIN) 600 MG tablet Take 600 mg by mouth 3 (three) times daily. Patient not taking: Reported on 07/31/2023 04/20/23   [provider]  hydrOXYzine (ATARAX) 25 MG tablet Take 25 mg by mouth at bedtime. Patient not taking: Reported on 07/31/2023    [provider]  sertraline (ZOLOFT) 100 MG tablet Take 2 tablets (200 mg total) by mouth daily. Patient not  taking: Reported on 07/31/2023 11/16/22   [name], MD  traZODone (DESYREL) 100 MG tablet Take 100 mg by mouth at bedtime. Patient not taking: Reported on 07/31/2023    [provider]     Critical care time: 31 mins

## 2023-08-01 NOTE — ED Notes (Signed)
Pt sitting at end of the bed trying to get up, security brings him back down every time he tries to get up,pt states he has to use the bathroom security tries to take him to  bathroom. Pt giving them a difficult time

## 2023-08-01 NOTE — ED Notes (Addendum)
Pt demonstrates increasing restlessness and anxiety. Pt demonstrates confusion over time of day and passage of time. Pt states "I've been here for 6 days" and asked to walk outside saying "it's light outside". Provider notified of pt status.

## 2023-08-01 NOTE — ED Notes (Signed)
[facility] contacted to transport pt to [facility]

## 2023-08-01 NOTE — ED Provider Notes (Signed)
Called by tech regarding patient having increasing agitation and anxiety.  Requested repeat CIWA.  Last CIWA was 15 4 hours ago.  He received 2 mg of Ativan.  Patient was given one-time dose for an additional 2 mg of Ativan.  All signs reviewed.  Only notable for blood pressure 156/94.   Shon Baton, MD 08/01/23 [phone]

## 2023-08-01 NOTE — Progress Notes (Signed)
Received call from [facility] RN stating that patient's suitcase was left at [facility] campus. Called patient's significant other, Dee, and let her know. She states that she will try to pick up patient belongings this evening at [facility].

## 2023-08-01 NOTE — ED Notes (Addendum)
Pt started attempting to open various doors. When asked what he was looking for, pt answers "my cigarettes and suitcase full of meds." Pt informed that his belongings were in the locker and not behind the closed doors. Pt also calling for his significant other. Pt becoming more agitated that he cannot find his various belongings. Pt is able to be directed back to his room but then starts pacing and searching again.

## 2023-08-01 NOTE — H&P (Signed)
History and Physical    Patient: Vernon Fowler ZOX:[id number] DOB: 05-26-1956 DOA: 07/30/2023 DOS: the patient was seen and examined on 08/01/2023 PCP: Vernon Corn, MD  Patient coming from: Home  Chief Complaint:  Chief Complaint  Patient presents with   Alcohol Problem   HPI: Vernon Fowler is a 67 y.o. male with medical history significant of alcohol abuse, with at least 14 detox hospitalizations and 7 rehab treatments, alcoholic liver disease, anxiety, depression, Dupuytren contracture, thyroid cancer, thyroidectomy, peptic ulcer disease, GERD, Barrett's esophagus, hyperlipidemia, hypertension, coronary artery atherosclerosis, aortic atherosclerosis, sleep apnea on CPAP, restless leg syndrome, epididymitis, small bowel obstruction who initially presented 2 days ago in the afternoon seeking treatment for detox.  He stated he has been drinking 2 to 3 pints of vodka and 1 bottle of wine daily recently.  He is currently restless despite receiving lorazepam 2 mg IVP x 2.  He was oriented to name, date with some difficulty, no he was in [place], but did not know which facility he was.  He thought that he is left index finger was a popsicle and was trying to eat it.  He denied fever, chills, rhinorrhea, sore throat, wheezing or hemoptysis.  No chest pain, palpitations, diaphoresis, PND, orthopnea or pitting edema of the lower extremities.  No abdominal pain, nausea, emesis, diarrhea, constipation, melena or hematochezia.  No flank pain, dysuria, frequency or hematuria.  No polyuria, polydipsia, polyphagia or blurred vision.   Lab work: UDS was positive for benzodiazepines.  Alcohol level was 369 mg/dL.  Normal acetaminophen, ammonia, salicylate and lipase levels.  CMP showed a CO2 of 20 mmol/L with an anion gap of 18.  Glucose was 67 and calcium 8.5 mg deciliter.  Renal function was normal.  LFTs showed an AST of 60 and ALT of 67, the rest of the hepatic functions were normal.  Imaging: CT head  without contrast with no acute intracranial normality, but there was patchy and confluent areas of decreased attenuation seen throughout the deep and periventricular white matter of the cerebral hemispheres bilaterally, compatible with chronic microvascular ischemic change disease..  Portable 1 view chest radiograph was negative.  Lumbar spine x-ray show aortic atherosclerosis and no acute displaced fractures or traumatic listhesis of the lumbar spine.  Almost 3 hours later, the patient had a fall while in the emergency department and had a repeat CT scan of the head which again showed no acute intracranial abnormality and generalized cerebral atrophy/microvascular disease changes of the supratentorial brain.   ED course: Initial vital signs were temperature 98.2 F, pulse 62, respiration 19, BP 143/89 mmHg O2 sat 97% on room air.  Patient received phenobarbital 65 mg IM yesterday morning, multiple doses of oral lorazepam and 2 doses of 2 mg lorazepam IVP this morning.  I also added phenobarbital 65 mg IVPB.  Review of Systems: As mentioned in the history of present illness. All other systems reviewed and are negative. Past Medical History:  Diagnosis Date   Alcohol abuse    Anxiety    Cancer (HCC)    GERD (gastroesophageal reflux disease)    Hyperlipidemia    Hypertension    Sleep apnea    w/CPAP   Thyroid disease    Past Surgical History:  Procedure Laterality Date   COLONOSCOPY     COLONOSCOPY  04/2016   [place] polyps   FASCIOTOMY Right 10/2019   LAPAROTOMY N/A 12/13/2020   Procedure: EXPLORATORY LAPAROTOMY, LYSIS OF ADHESIONS;  Surgeon: Vernon Levee, MD;  Location: [facility];  Service: General;  Laterality: N/A;   SHOULDER ARTHROSCOPY WITH ROTATOR CUFF REPAIR Right 03/01/2019   Procedure: Right shoulder arthroscopy, subacromial decompression, distal clavicle resection, rotator cuff repair;  Surgeon: Vernon Hanly, MD;  Location: [facility];  Service: Orthopedics;  Laterality: Right;    SMALL INTESTINE SURGERY  12/13/2020   SBO   THYROIDECTOMY     1987   UPPER GASTROINTESTINAL ENDOSCOPY     Social History:  reports that he quit smoking about 4 years ago. His smoking use included cigarettes. He started smoking about 8 years ago. He has a 1 pack-year smoking history. He has never used smokeless tobacco. He reports that he does not currently use alcohol. He reports that he does not use drugs.  Allergies  Allergen Reactions   Sulfa Antibiotics Rash    Fixed based skin reaction    Family History  Problem Relation Age of Onset   Cancer Mother    Anxiety disorder Mother    Diabetes Father    Hyperlipidemia Father    Hypertension Father    Cancer Maternal Grandmother    Cancer Paternal Grandmother    Heart disease Paternal Grandfather    Anxiety disorder Daughter    Colon cancer Neg Hx    Esophageal cancer Neg Hx    Stomach cancer Neg Hx    Rectal cancer Neg Hx    Colon polyps Neg Hx     Prior to Admission medications   Medication Sig Start Date End Date Taking? Authorizing Provider  b complex vitamins tablet Take 1 tablet by mouth daily.   Yes [provider]  carboxymethylcellulose (REFRESH PLUS) 0.5 % SOLN Place 1 drop into both eyes 2 (two) times daily as needed (dry eyes).    Yes [provider]  cholecalciferol (VITAMIN D3) 25 MCG (1000 UNIT) tablet Take by mouth. 09/03/19  Yes [provider]  FLUoxetine (PROZAC) 20 MG capsule Take 20 mg by mouth daily. 07/27/23  Yes [provider]  irbesartan (AVAPRO) 150 MG tablet Take 150 mg by mouth daily as needed (His BP was dropping really low so his MD told him to take if only if his DBP>90). 03/26/21  Yes [provider]  levothyroxine (SYNTHROID) 175 MCG tablet Take by mouth. 08/05/21  Yes [provider]  LORazepam (ATIVAN) 1 MG tablet Take 1 mg by mouth as needed for anxiety. 06/15/23  Yes [provider]  Multiple Vitamin (MULTIVITAMIN WITH MINERALS)  TABS tablet Take 1 tablet by mouth daily. Men's 50+   Yes [provider]  Omega-3 Fatty Acids (FISH OIL) 1000 MG CAPS Take by mouth. 09/03/19  Yes [provider]  omeprazole (PRILOSEC) 40 MG capsule TAKE ONE CAPSULE BY MOUTH TWICE A DAY Patient taking differently: 40 mg 2 (two) times daily as needed (heartburn). 12/29/21  Yes [name], MD  pregabalin (LYRICA) 150 MG capsule Take 1 capsule (150 mg total) by mouth 3 (three) times daily. No early refill 02/09/23 07/31/23 Yes [name], PA-C  propranolol (INDERAL) 20 MG tablet Take 20 mg by mouth 2 (two) times daily.   Yes [provider]  QUEtiapine (SEROQUEL) 50 MG tablet Take 50 mg by mouth at bedtime. 05/25/23  Yes [provider]  rOPINIRole (REQUIP) 1 MG tablet Take 1 mg by mouth at bedtime. 07/20/23  Yes [provider]  rosuvastatin (CRESTOR) 20 MG tablet Take 20 mg by mouth daily.   Yes [provider]  thiamine (VITAMIN B1) 100 MG tablet  Take 1 tablet by mouth daily.   Yes [provider]  B Complex-Biotin-FA (SUPER QUINTS B-50) TABS Take by mouth. Patient not taking: Reported on 07/31/2023 09/03/19   [provider]  gabapentin (NEURONTIN) 600 MG tablet Take 600 mg by mouth 3 (three) times daily. Patient not taking: Reported on 07/31/2023 04/20/23   [provider]  hydrOXYzine (ATARAX) 25 MG tablet Take 25 mg by mouth at bedtime. Patient not taking: Reported on 07/31/2023    [provider]  sertraline (ZOLOFT) 100 MG tablet Take 2 tablets (200 mg total) by mouth daily. Patient not taking: Reported on 07/31/2023 11/16/22   [name], MD  traZODone (DESYREL) 100 MG tablet Take 100 mg by mouth at bedtime. Patient not taking: Reported on 07/31/2023    [provider]    Physical Exam: Vitals:   08/01/23 0350 08/01/23 0351 08/01/23 0641 08/01/23 0718  BP: (!) 127/96 (!) 127/96 (!) 158/109 (!) 158/109  Pulse: 61 61 61 61  Resp: 20   16   Temp:   98.2 F (36.8 C)   TempSrc:   Oral   SpO2: 98%  97%   Weight:      Height:       Physical Exam Vitals and nursing note reviewed.  Constitutional:      General: He is not in acute distress.    Appearance: He is ill-appearing.  HENT:     Head: Normocephalic.     Nose: No rhinorrhea.     Mouth/Throat:     Mouth: Mucous membranes are dry.  Eyes:     General: No scleral icterus.    Pupils: Pupils are equal, round, and reactive to light.  Neck:     Vascular: No JVD.  Cardiovascular:     Rate and Rhythm: Normal rate and regular rhythm.     Heart sounds: S1 normal and S2 normal.  Pulmonary:     Effort: Pulmonary effort is normal.     Breath sounds: Normal breath sounds.  Abdominal:     General: Bowel sounds are normal. There is no distension.     Palpations: Abdomen is soft.     Tenderness: There is no abdominal tenderness.  Musculoskeletal:     Cervical back: Neck supple.     Right lower leg: No edema.     Left lower leg: No edema.  Skin:    General: Skin is warm and dry.  Neurological:     General: No focal deficit present.     Mental Status: He is alert. He is disoriented.     Cranial Nerves: No cranial nerve deficit.     Motor: Tremor present.  Psychiatric:        Attention and Perception: He is inattentive. He perceives visual hallucinations.        Mood and Affect: Mood is anxious.        Behavior: Behavior is hyperactive. Behavior is not aggressive. Behavior is cooperative.     Data Reviewed:  Results are pending, will review when available.  Assessment and Plan: Principal Problem:   Alcohol use disorder, severe, dependence (HCC)   Delirium tremens (HCC) Inpatient/stepdown. CIWA protocol with lorazepam. Phenobarbital taper. Magnesium sulfate supplementation. Folate, MVI and thiamine. Consult TOC team. Lorazepam, phenobarbital and haloperidol not effective. -PCCM consulted for treatment with Precedex infusion. Frequently trying to get out  of bed. -Soft restraints to prevent falls/injuries.  Active Problems:   Hypertension Continue propranolol 20 mg p.o. twice daily. Continue Avapro 150  mg as needed. Labetalol 20 mg IVP every 2 hours as needed.    GERD (gastroesophageal reflux disease)   Barrett's esophagus Continue omeprazole or formulary equivalent. Follow-up with gastroenterology as an outpatient.    Acquired hypothyroidism Continue levothyroxine 175 mcg p.o. daily.    Hyperlipidemia Hold statin for now.    Hypophosphatemia Replacing. Follow-up level as needed.    GAD (generalized anxiety disorder)   Depression   Alcohol withdrawal (HCC) Alcohol cessation. Touched base with psychiatry. -They will follow once the patient is off DTs/oriented. -Continue Seroquel 50 mg p.o. at bedtime.    Advance Care Planning:   Code Status: Full Code   Consults: PCCM Levon Hedger, MD).  Family Communication:   Severity of Illness: The appropriate patient status for this patient is INPATIENT. Inpatient status is judged to be reasonable and necessary in order to provide the required intensity of service to ensure the patient's safety. The patient's presenting symptoms, physical exam findings, and initial radiographic and laboratory data in the context of their chronic comorbidities is felt to place them at high risk for further clinical deterioration. Furthermore, it is not anticipated that the patient will be medically stable for discharge from the hospital within 2 midnights of admission.   * I certify that at the point of admission it is my clinical judgment that the patient will require inpatient hospital care spanning beyond 2 midnights from the point of admission due to high intensity of service, high risk for further deterioration and high frequency of surveillance required.*  Author: Bobette Mo, MD 08/01/2023 7:47 AM  For on call review [email or web address].   This document was prepared using Dragon voice  recognition software and may contain some unintended transcription errors.

## 2023-08-01 NOTE — ED Notes (Signed)
Pt continues to try and open various doors, along with turning the hallway lights on and off. Patient continues to pace while looking for his "green carry-on". Pt has been restless all night. Pt continues to hit his head and fists against the glass while pacing back and forth. Patient is walking in and out of rooms. Pt is currently naked from the waist down. Patient is pacing.

## 2023-08-01 NOTE — ED Notes (Signed)
Pt walked into the wrong room and began fumbling with the bed and lifting the mattress. When questioned why he was doing this, pt states that he was "looking for the radio controls." Pt seems to be increasingly confused, stating that he "put the TV remote on the charger". This writer directed patient to room where patient laid down after attempting to turn the light off completely despite being told multiple times that the light does not turn off.

## 2023-08-01 NOTE — TOC Initial Note (Signed)
Transition of Care [facility]) - Initial/Assessment Note    Patient Details  Name: Vernon Fowler MRN: [id number] Date of Birth: [birth year]  Transition of [facility]) CM/SW Contact:    Marliss Coots, LCSW Phone Number: 08/01/2023, 3:44 PM  Clinical Narrative:                  This TOC attempted to conduct TOC assessment and offer substance use resources to patient at bedside. Patient was asleep when this CSW attempted to visit at bedside. TOC will continue to follow.         Patient Goals and CMS Choice            Expected Discharge Plan and Services                                              Prior Living Arrangements/Services                       Activities of Daily Living      Permission Sought/Granted                  Emotional Assessment              Admission diagnosis:  Alcohol withdrawal (HCC) [F10.939] Delirium tremens (HCC) [F10.931] Alcohol dependence with uncomplicated intoxication (HCC) [F10.220] Alcohol withdrawal syndrome with perceptual disturbance (HCC) [F10.932] Patient Active Problem List   Diagnosis Date Noted   Alcohol withdrawal (HCC) 08/01/2023   Hardening of the aorta (main artery of the heart) (HCC) 08/01/2023   Delirium tremens (HCC) 08/01/2023   Hypophosphatemia 08/01/2023   Depression 08/01/2023   Alcohol use disorder 02/18/2023   Chronic pain syndrome 10/24/2022   Actinic keratosis 02/18/2022   SBO (small bowel obstruction) (HCC) 12/13/2020   Atherosclerosis of coronary artery 09/26/2019   Epididymitis 06/19/2019   Pain of right shoulder joint on movement 03/12/2019   Acute alcoholic liver disease 09/01/2018   Barrett's esophagus 08/04/2018   Acquired hypothyroidism 01/08/2018   Hyperlipidemia 01/08/2018   Dupuytren contracture 12/23/2017   Alcohol use disorder, severe, dependence (HCC) 12/09/2017   Hypertension 10/28/2017   GERD (gastroesophageal reflux disease) 10/28/2017   Alcohol abuse  10/28/2017   GAD (generalized anxiety disorder) 10/28/2017   PUD (peptic ulcer disease) 10/01/2011   Thyroid cancer (HCC) 03/31/2011   PCP:  Creola Corn, MD Pharmacy:   [facility] [id number] [place], [place] - [address] [place] [address] Phone: [phone] Fax: [phone]  [facility] [id number] - [place], [place] - [address] [place] [address] Phone: [phone] Fax: [phone]     Social Determinants of Health (SDOH) Social History: SDOH Screenings   Food Insecurity: No Food Insecurity (02/18/2023)  Housing: Low Risk  (02/18/2023)  Transportation Needs: No Transportation Needs (02/18/2023)  Utilities: Not At Risk (02/18/2023)  Alcohol Screen: Medium Risk (12/09/2017)  Depression (PHQ2-9): Low Risk  (02/20/2023)  Tobacco Use: Medium Risk (07/30/2023)   SDOH Interventions:     Readmission Risk Interventions     No data to display

## 2023-08-01 NOTE — ED Provider Notes (Signed)
6:27 AM Patient increasingly agitated.  Banging on walls.  Having active hallucinations.  Continues to have high CIWA scores.  Not controlled with p.o. Ativan including additional doses of Ativan throughout the night.  Will move him back to the acute side.  Feel he warrants medical admission given concern for progressive withdrawal symptoms and early DTs.  Physical Exam  BP (!) 127/96   Pulse 61   Temp 98.3 F (36.8 C) (Oral)   Resp 20   Ht 1.88 m (6\' 2" )   Wt 99.8 kg   SpO2 98%   BMI 28.25 kg/m   Physical Exam Awake, anxious appearing Endorses headache, pacing Resting tremor noted Appears to be picking at things that are not there.  Procedures  .Critical Care  Performed by: Shon Baton, MD Authorized by: Shon Baton, MD   Critical care provider statement:    Critical care time (minutes):  45   Critical care was necessary to treat or prevent imminent or life-threatening deterioration of the following conditions: Alcohol withdrawal, DTs.   Critical care was time spent personally by me on the following activities:  Development of treatment plan with patient or surrogate, discussions with consultants, evaluation of patient's response to treatment, examination of patient, ordering and review of laboratory studies, ordering and review of radiographic studies, ordering and performing treatments and interventions, pulse oximetry, re-evaluation of patient's condition and review of old charts   ED Course / MDM    Medical Decision Making Amount and/or Complexity of Data Reviewed Labs: ordered. Radiology: ordered.  Risk OTC drugs. Prescription drug management.   Problem List Items Addressed This Visit   None Visit Diagnoses     Alcohol dependence with uncomplicated intoxication (HCC)    -  Primary   Alcohol withdrawal syndrome with perceptual disturbance (HCC)                 Wilkie Aye, Mayer Masker, MD 08/01/23 [phone]

## 2023-08-01 NOTE — ED Notes (Signed)
Provider and charge nurse informed of pt change in behavior. Provider and charge nurse decided to transfer pt to ED room.

## 2023-08-01 NOTE — ED Notes (Signed)
Pt given breakfast tray and pt still in restraints.

## 2023-08-01 NOTE — ED Notes (Signed)
ED TO INPATIENT HANDOFF REPORT  Name/Age/Gender Vernon Fowler 67 y.o. male  Code Status    Code Status Orders  (From admission, onward)           Start     Ordered   08/01/23 0822  Full code  Continuous       Question:  By:  Answer:  Consent: discussion documented in EHR   08/01/23 0823           Code Status History     Date Active Date Inactive Code Status Order ID Comments User Context   07/30/2023 1650 08/01/2023 0823 Full Code [id number]  Melene Plan, DO ED   02/18/2023 1745 02/20/2023 1723 Full Code [id number]  Lenard Lance, FNP ED   02/18/2023 1641 02/18/2023 1715 Full Code [id number]  Lenard Lance, FNP ED   02/18/2023 1551 02/18/2023 1628 Full Code [id number]  Lenard Lance, FNP ED   02/18/2023 1510 02/18/2023 1551 Full Code [id number]  Lenard Lance, FNP ED   12/14/2020 0121 12/18/2020 1915 Full Code [id number]  Romie Levee, MD Inpatient       Home/SNF/Other Home  Chief Complaint Alcohol withdrawal [facility]) [F10.939] Delirium tremens (HCC) [F10.931]  Level of Care/Admitting Diagnosis ED Disposition     ED Disposition  Admit   Condition  --   Comment  Hospital Area: [facility] [[id number]]  Level of Care: ICU [6]  May admit patient to [facility] or [facility] if equivalent level of care is available:: No  Covid Evaluation: Asymptomatic - no recent exposure (last 10 days) testing not required  Diagnosis: Delirium tremens [facility]) [[id number]]  Admitting Physician: Lorin Glass [[id number]]  Attending Physician: Lorin Glass [[id number]]  Certification:: I certify this patient will need inpatient services for at least 2 midnights          Medical History Past Medical History:  Diagnosis Date   Alcohol abuse    Anxiety    Cancer (HCC)    Depression 08/01/2023   Dupuytren contracture 12/23/2017   GERD (gastroesophageal reflux disease)    Hyperlipidemia    Hypertension    PUD (peptic ulcer disease) 10/01/2011   SBO (small bowel obstruction)  (HCC) 12/13/2020   Sleep apnea    w/CPAP   Thyroid disease     Allergies Allergies  Allergen Reactions   Sulfa Antibiotics Rash    Fixed based skin reaction    IV Location/Drains/Wounds Patient Lines/Drains/Airways Status     Active Line/Drains/Airways     Name Placement date Placement time Site Days   Peripheral IV 08/01/23 20 G Left;Posterior Forearm 08/01/23  0643  Forearm  less than 1   Peripheral IV 08/01/23 20 G Anterior;Right Forearm 08/01/23  1107  Forearm  less than 1            Labs/Imaging Results for orders placed or performed during the hospital encounter of 07/30/23 (from the past 48 hour(s))  Comprehensive metabolic panel     Status: Abnormal   Collection Time: 07/30/23  4:37 PM  Result Value Ref Range   Sodium 140 135 - 145 mmol/L   Potassium 3.5 3.5 - 5.1 mmol/L   Chloride 102 98 - 111 mmol/L   CO2 20 (L) 22 - 32 mmol/L   Glucose, Bld 67 (L) 70 - 99 mg/dL    Comment: Glucose reference range applies only to samples taken after fasting for at least 8 hours.   BUN 17 8 - 23 mg/dL   Creatinine,  Ser 1.03 0.61 - 1.24 mg/dL   Calcium 8.5 (L) 8.9 - 10.3 mg/dL   Total Protein 8.0 6.5 - 8.1 g/dL   Albumin 4.7 3.5 - 5.0 g/dL   AST 60 (H) 15 - 41 U/L   ALT 67 (H) 0 - 44 U/L   Alkaline Phosphatase 49 38 - 126 U/L   Total Bilirubin 0.6 <1.2 mg/dL   GFR, Estimated >57 >84 mL/min    Comment: (NOTE) Calculated using the CKD-EPI Creatinine Equation (2021)    Anion gap 18 (H) 5 - 15    Comment: Performed at [facility], [address], [place], [place] [address]  Ethanol     Status: Abnormal   Collection Time: 07/30/23  4:37 PM  Result Value Ref Range   Alcohol, Ethyl (B) 369 (HH) <10 mg/dL    Comment: CRITICAL RESULT CALLED TO, READ BACK BY AND VERIFIED WITH SHAW,S RN @ 1732 07/30/23 BY CHILDRESS,E (NOTE) Lowest detectable limit for serum alcohol is 10 mg/dL.  For medical purposes only. Performed at [facility], [address], [place], [place] [address]   cbc     Status: Abnormal   Collection Time: 07/30/23  4:37 PM  Result Value Ref Range   WBC 6.0 4.0 - 10.5 K/uL   RBC 4.19 (L) 4.22 - 5.81 MIL/uL   Hemoglobin 15.1 13.0 - 17.0 g/dL   HCT 32.4 40.1 - 02.7 %   MCV 103.8 (H) 80.0 - 100.0 fL   MCH 36.0 (H) 26.0 - 34.0 pg   MCHC 34.7 30.0 - 36.0 g/dL   RDW 25.3 66.4 - 40.3 %   Platelets 210 150 - 400 K/uL   nRBC 0.0 0.0 - 0.2 %    Comment: Performed at [facility], [address] [place], [place] [address]  Acetaminophen level     Status: Abnormal   Collection Time: 07/30/23  4:37 PM  Result Value Ref Range   Acetaminophen (Tylenol), Serum <10 (L) 10 - 30 ug/mL    Comment: (NOTE) Therapeutic concentrations vary significantly. A range of 10-30 ug/mL  may be an effective concentration for many patients. However, some  are best treated at concentrations outside of this range. Acetaminophen concentrations >150 ug/mL at 4 hours after ingestion  and >50 ug/mL at 12 hours after ingestion are often associated with  toxic reactions.  Performed at [facility], [address] [place], [place] [address]   Salicylate level     Status: Abnormal   Collection Time: 07/30/23  4:37 PM  Result Value Ref Range   Salicylate Lvl <7.0 (L) 7.0 - 30.0 mg/dL    Comment: Performed at [facility], [address], [place], [place] [address]  Lipase, blood     Status: None   Collection Time: 07/30/23  4:37 PM  Result Value Ref Range   Lipase 38 11 - 51 U/L    Comment: Performed at [facility], [address], [place], [place] [address]  Ammonia     Status: None   Collection Time: 07/30/23  4:50 PM  Result Value Ref Range   Ammonia 18 9 - 35 umol/L    Comment: Performed at [facility], [address] [place], [place] [address]  Rapid urine drug screen (hospital performed)     Status: Abnormal   Collection Time:  07/30/23  8:34 PM  Result Value Ref Range   Opiates NONE DETECTED NONE DETECTED  Cocaine NONE DETECTED NONE DETECTED   Benzodiazepines POSITIVE (A) NONE DETECTED   Amphetamines NONE DETECTED NONE DETECTED   Tetrahydrocannabinol NONE DETECTED NONE DETECTED   Barbiturates NONE DETECTED NONE DETECTED    Comment: (NOTE) DRUG SCREEN FOR MEDICAL PURPOSES ONLY.  IF CONFIRMATION IS NEEDED FOR ANY PURPOSE, NOTIFY LAB WITHIN 5 DAYS.  LOWEST DETECTABLE LIMITS FOR URINE DRUG SCREEN Drug Class                     Cutoff (ng/mL) Amphetamine and metabolites    1000 Barbiturate and metabolites    200 Benzodiazepine                 200 Opiates and metabolites        300 Cocaine and metabolites        300 THC                            50 Performed at [facility] [address], [place], [place] [address]   Phosphorus     Status: Abnormal   Collection Time: 08/01/23  8:30 AM  Result Value Ref Range   Phosphorus 2.2 (L) 2.5 - 4.6 mg/dL    Comment: Performed at [facility], [address], [place], [place] [address]  CBC     Status: Abnormal   Collection Time: 08/01/23  8:30 AM  Result Value Ref Range   WBC 6.4 4.0 - 10.5 K/uL   RBC 4.01 (L) 4.22 - 5.81 MIL/uL   Hemoglobin 14.7 13.0 - 17.0 g/dL   HCT 09.8 11.9 - 14.7 %   MCV 100.2 (H) 80.0 - 100.0 fL   MCH 36.7 (H) 26.0 - 34.0 pg   MCHC 36.6 (H) 30.0 - 36.0 g/dL   RDW 82.9 56.2 - 13.0 %   Platelets 201 150 - 400 K/uL   nRBC 0.0 0.0 - 0.2 %    Comment: Performed at [facility], [address] [place], [place] [address]  Magnesium     Status: None   Collection Time: 08/01/23  8:30 AM  Result Value Ref Range   Magnesium 1.7 1.7 - 2.4 mg/dL    Comment: Performed at [facility], [address], [place], [place] [address]  Comprehensive metabolic panel     Status: Abnormal   Collection Time: 08/01/23  8:30 AM  Result Value Ref Range   Sodium 135 135 - 145  mmol/L   Potassium 3.5 3.5 - 5.1 mmol/L   Chloride 100 98 - 111 mmol/L   CO2 20 (L) 22 - 32 mmol/L   Glucose, Bld 128 (H) 70 - 99 mg/dL    Comment: Glucose reference range applies only to samples taken after fasting for at least 8 hours.   BUN 19 8 - 23 mg/dL   Creatinine, Ser 9.52 0.61 - 1.24 mg/dL   Calcium 9.2 8.9 - 84.1 mg/dL   Total Protein 8.0 6.5 - 8.1 g/dL   Albumin 4.9 3.5 - 5.0 g/dL   AST 56 (H) 15 - 41 U/L   ALT 53 (H) 0 - 44 U/L   Alkaline Phosphatase 48 38 - 126 U/L   Total Bilirubin 1.4 (H) <1.2 mg/dL   GFR, Estimated >32 >44 mL/min    Comment: (NOTE) Calculated using the CKD-EPI Creatinine Equation (2021)    Anion gap 15 5 - 15    Comment: Performed at [facility], [address], [place], [place] [address]  Ethanol     Status: None   Collection Time: 08/01/23  9:00 AM  Result Value Ref Range   Alcohol, Ethyl (B) <10 <10 mg/dL    Comment: (NOTE) Lowest detectable limit for serum alcohol is 10 mg/dL.  For medical purposes only. Performed at [facility], [address] [place], [place] [address]    CT Head Wo Contrast  Result Date: 07/30/2023 CLINICAL DATA:  Status post fall. EXAM: CT HEAD WITHOUT CONTRAST TECHNIQUE: Contiguous axial images were obtained from the base of the skull through the vertex without intravenous contrast. RADIATION DOSE REDUCTION: This exam was performed according to the departmental dose-optimization program which includes automated exposure control, adjustment of the mA and/or kV according to patient size and/or use of iterative reconstruction technique. COMPARISON:  None Available. FINDINGS: Brain: There is mild cerebral atrophy with widening of the extra-axial spaces and ventricular dilatation. There are areas of decreased attenuation within the white matter tracts of the supratentorial brain, consistent with microvascular disease changes. Vascular: Moderate severity bilateral cavernous carotid  artery calcification is noted. Skull: Normal. Negative for fracture or focal lesion. Sinuses/Orbits: No acute finding. Other: None. IMPRESSION: 1. No acute intracranial abnormality. 2. Generalized cerebral atrophy and microvascular disease changes of the supratentorial brain. Electronically Signed   By: Aram Candela M.D.   On: 07/30/2023 19:55   DG Chest Port 1 View  Result Date: 07/30/2023 CLINICAL DATA:  psych clearance EXAM: PORTABLE CHEST 1 VIEW COMPARISON:  CT chest 07/09/2022 FINDINGS: Patient is rotated. The heart and mediastinal contours are within normal limits. No focal consolidation. No pulmonary edema. No pleural effusion. No pneumothorax. No acute osseous abnormality. IMPRESSION: No active disease. Electronically Signed   By: Tish Frederickson M.D.   On: 07/30/2023 17:31   DG Lumbar Spine Complete  Result Date: 07/30/2023 CLINICAL DATA:  back pain post fall EXAM: LUMBAR SPINE - COMPLETE 4+ VIEW COMPARISON:  None Available. FINDINGS: Limited evaluation due to overlapping osseous structures and overlying soft tissues. There is no evidence of lumbar spine fracture. Alignment is normal. Intervertebral disc spaces are maintained. Atherosclerotic plaque of the aorta. IMPRESSION: 1. No acute displaced fracture or traumatic listhesis of the lumbar spine. 2.  Aortic Atherosclerosis (ICD10-I70.0). Electronically Signed   By: Tish Frederickson M.D.   On: 07/30/2023 17:31   CT Head Wo Contrast  Result Date: 07/30/2023 CLINICAL DATA:  Head trauma, minor (Age >= 65y) Patient is tearful at time of triage. Patient reports he is an alcoholic, drinks a 2-3 pints of vodka and 1 bottle of wine a day. Wants to detox. EXAM: CT HEAD WITHOUT CONTRAST TECHNIQUE: Contiguous axial images were obtained from the base of the skull through the vertex without intravenous contrast. RADIATION DOSE REDUCTION: This exam was performed according to the departmental dose-optimization program which includes automated exposure  control, adjustment of the mA and/or kV according to patient size and/or use of iterative reconstruction technique. COMPARISON:  None Available. FINDINGS: Brain: Patchy and confluent areas of decreased attenuation are noted throughout the deep and periventricular white matter of the cerebral hemispheres bilaterally, compatible with chronic microvascular ischemic disease. No evidence of large-territorial acute infarction. No parenchymal hemorrhage. No mass lesion. No extra-axial collection. No mass effect or midline shift. No hydrocephalus. Basilar cisterns are patent. Vascular: No hyperdense vessel. Skull: No acute fracture or focal lesion. Sinuses/Orbits: Paranasal sinuses and mastoid air cells are clear. The orbits are unremarkable. Other: None. IMPRESSION: No  acute intracranial abnormality. Electronically Signed   By: Tish Frederickson M.D.   On: 07/30/2023 17:05    Pending Labs Unresulted Labs (From admission, onward)     Start     Ordered   08/02/23 0500  HIV Antibody (routine testing w rflx)  (HIV Antibody (Routine testing w reflex) panel)  Tomorrow morning,   R        08/01/23 0823   08/02/23 0500  CBC  Daily,   R      08/01/23 0944   08/02/23 0500  Magnesium  Daily,   R      08/01/23 0945   08/02/23 0500  Phosphorus  Daily,   R      08/01/23 0945   08/02/23 0500  Basic metabolic panel  Daily,   R      08/01/23 0945   08/02/23 0500  Hepatic function panel  Tomorrow morning,   R        08/01/23 0945            Vitals/Pain Today's Vitals   08/01/23 0941 08/01/23 0945 08/01/23 1100 08/01/23 1130  BP: (!) 147/103  (!) 180/134 95/61  Pulse: 65 64 83 86  Resp: 20   20  Temp: 98.2 F (36.8 C)     TempSrc: Oral     SpO2: 100%   94%  Weight:      Height:      PainSc:        Isolation Precautions No active isolations  Medications Medications  thiamine (VITAMIN B1) tablet 100 mg ( Oral See Alternative 08/01/23 0937)    Or  thiamine (VITAMIN B1) injection 100 mg (100 mg  Intravenous Given 08/01/23 0937)  FLUoxetine (PROZAC) capsule 20 mg (0 mg Oral Hold 08/01/23 1106)  irbesartan (AVAPRO) tablet 150 mg (has no administration in time range)  levothyroxine (SYNTHROID) tablet 175 mcg (175 mcg Oral Patient Refused/Not Given 08/01/23 4098)  multivitamin with minerals tablet 1 tablet (1 tablet Oral Given 07/31/23 1123)  pantoprazole (PROTONIX) EC tablet 40 mg (40 mg Oral Given 07/31/23 1123)  QUEtiapine (SEROQUEL) tablet 50 mg (50 mg Oral Given 07/31/23 2227)  propranolol (INDERAL) tablet 20 mg (20 mg Oral Given 07/31/23 2225)  rOPINIRole (REQUIP) tablet 1 mg (1 mg Oral Given 07/31/23 2225)  thiamine (VITAMIN B1) tablet 100 mg (100 mg Oral Given 07/31/23 1121)  LORazepam (ATIVAN) tablet 1-4 mg ( Oral See Alternative 08/01/23 1027)    Or  LORazepam (ATIVAN) injection 1-4 mg (4 mg Intravenous Given 08/01/23 1027)  LORazepam (ATIVAN) tablet 0-4 mg (0 mg Oral Hold 08/01/23 1101)    Followed by  LORazepam (ATIVAN) tablet 0-4 mg (has no administration in time range)  acetaminophen (TYLENOL) tablet 650 mg (has no administration in time range)    Or  acetaminophen (TYLENOL) suppository 650 mg (has no administration in time range)  ondansetron (ZOFRAN) tablet 4 mg (has no administration in time range)    Or  ondansetron (ZOFRAN) injection 4 mg (has no administration in time range)  labetalol (NORMODYNE) injection 20 mg (has no administration in time range)  haloperidol lactate (HALDOL) injection 5 mg (5 mg Intravenous Given 08/01/23 0859)  dexmedetomidine (PRECEDEX) 400 MCG/100ML (4 mcg/mL) infusion (0.9 mcg/kg/hr  99.8 kg Intravenous Rate/Dose Change 08/01/23 1104)  sodium phosphate 30 mmol in dextrose 5 % 250 mL infusion (has no administration in time range)  potassium chloride 10 mEq in 100 mL IVPB (has no administration in time range)  enoxaparin (LOVENOX) injection 40  mg (has no administration in time range)  PHENObarbital (LUMINAL) injection 65 mg (65 mg Intramuscular Given  07/31/23 1202)  LORazepam (ATIVAN) tablet 2 mg (2 mg Oral Given 08/01/23 0241)  LORazepam (ATIVAN) tablet 2 mg (2 mg Oral Given 08/01/23 0431)  LORazepam (ATIVAN) injection 2 mg (2 mg Intravenous Given 08/01/23 0651)  PHENObarbital (LUMINAL) injection 65 mg (65 mg Intravenous Given 08/01/23 0749)  magnesium sulfate IVPB 2 g 50 mL (0 g Intravenous Stopped 08/01/23 1040)  LORazepam (ATIVAN) injection 2 mg (2 mg Intravenous Given 08/01/23 0811)  PHENObarbital (LUMINAL) 260 mg in sodium chloride 0.9 % 100 mL IVPB (0 mg Intravenous Stopped 08/01/23 1010)    Mobility walks

## 2023-08-01 NOTE — Plan of Care (Signed)
  Problem: Safety: Goal: Non-violent Restraint(s) Outcome: Progressing   Problem: Clinical Measurements: Goal: Will remain free from infection Outcome: Progressing Goal: Diagnostic test results will improve Outcome: Progressing Goal: Respiratory complications will improve Outcome: Progressing Goal: Cardiovascular complication will be avoided Outcome: Progressing   Problem: Pain Management: Goal: General experience of comfort will improve Outcome: Progressing   Problem: Safety: Goal: Ability to remain free from injury will improve Outcome: Progressing   Problem: Education: Goal: Knowledge of General Education information will improve Description: Including pain rating scale, medication(s)/side effects and non-pharmacologic comfort measures Outcome: Not Progressing   Problem: Health Behavior/Discharge Planning: Goal: Ability to manage health-related needs will improve Outcome: Not Progressing   Problem: Clinical Measurements: Goal: Ability to maintain clinical measurements within normal limits will improve Outcome: Not Progressing   Problem: Activity: Goal: Risk for activity intolerance will decrease Outcome: Not Progressing   Problem: Nutrition: Goal: Adequate nutrition will be maintained Outcome: Not Progressing   Problem: Coping: Goal: Level of anxiety will decrease Outcome: Not Progressing

## 2023-08-01 NOTE — ED Notes (Addendum)
Pt in 3 different restraints, pt becoming agitated stating that  he's not able to sit up, NT offered him some food pt denied. Pt tossing around and fighting restraints pt appears red to the face for trying to fight the restraints. Rn notified him what's going on and tried to calm pt numerous of times.

## 2023-08-01 NOTE — ED Notes (Addendum)
Pt made repeated attempts to walk through the doors out of [facility]. Pt states he is "very disoriented" and he "keeps thinking he's at home." Pt proceeded to touch the glass and reach through the slot in the glass at things that were not there. Pt frequently stares ahead as if focusing on something not in front of him. Pt repeatedly states he felt "disoriented." Pt continues to show confusion regarding the passage of time.  Provider made aware.

## 2023-08-01 NOTE — Progress Notes (Signed)
Patient arrived to unit at 1330. Patient only oriented to himself, agitated and restless. Precedex infusing at 0.8 on arrival, increased to 0.9. Patient in posey belt & 4 point restraints; however, no order for posey belt. MD notified and order placed. Patient resting comfortable at this time.

## 2023-08-01 NOTE — ED Provider Notes (Addendum)
Called by paramedic to assess the patient with concerns for hallucinations.  He received an additional dose of Ativan in addition to his CIWA dose previously.  On my assessment he is resting and sleeping.  No obvious tremor.  Vital signs reviewed and notable only for blood pressure of 127/96.  Paramedic reported that CIWA scores have been uptrending.  Will reassess.  4:22 AM Assessed patient.  Patient is alert and oriented.  He is anxious and pacing.  He has a fine tremor.  I walked through the CIWA assessment with paramedic.  Originally scored 20, on my assessment he is more like a 13 mostly for agitation.  Will again order a one-time dose of Ativan in conjunction with ongoing CIWA protocol.  No evidence of DTs.   Shon Baton, MD 08/01/23 8938    Shon Baton, MD 08/01/23 [phone]

## 2023-08-01 NOTE — ED Notes (Signed)
Pt having active delusion that dogs are getting away, refusing to stay in bed, very agitated

## 2023-08-01 NOTE — ED Notes (Signed)
Pt's daughter contacted and informed about pt condition and transfer to [facility]. Daughter authorized transfer.

## 2023-08-01 NOTE — ED Notes (Signed)
Pt has sitter at bedside, pt up walking around room playing with the cords and talking, pt seems calm.

## 2023-08-01 NOTE — Progress Notes (Signed)
Patient's CIWA score as follows; however, patient only stays awake for a short period of time. Since he is able to rest comfortably without intervention, Ativan not given for CIWA score.   08/01/23 1628  CIWA-Ar  Nausea and Vomiting 0  Tactile Disturbances 0  Tremor 4  Auditory Disturbances 1  Paroxysmal Sweats 1  Visual Disturbances 2  Anxiety 3  Headache, Fullness in Head 0  Agitation 4  Orientation and Clouding of Sensorium 4  CIWA-Ar Total 19

## 2023-08-01 NOTE — Consult Note (Signed)
Brief Psychiatry Consult Note  Consulted by ICU team for + suicide screen. Unfortunately, unable to see pt today. Admitted to ICU for EtOH w/d - psych hx limited in chart but appears to be mostly EtOH, rehab, without significant hx suicide attempts/ideation. A&Ox1 per nursing documentation  -- start IV thiamine -- will see first tomorrow -- defer sitter to ICU team  Vernon Fowler A Vernon Fowler

## 2023-08-01 NOTE — ED Notes (Signed)
Pt came out of his room, and stated that he needed to "run an errand". When asked where he was going, pt states that he is going to the store with his fiance's car. Pt was able to be verbally redirected to bed and is calm and cooperative.

## 2023-08-02 DIAGNOSIS — E876 Hypokalemia: Secondary | ICD-10-CM | POA: Diagnosis not present

## 2023-08-02 DIAGNOSIS — F1020 Alcohol dependence, uncomplicated: Secondary | ICD-10-CM | POA: Diagnosis not present

## 2023-08-02 DIAGNOSIS — F10932 Alcohol use, unspecified with withdrawal with perceptual disturbance: Secondary | ICD-10-CM

## 2023-08-02 LAB — HEPATIC FUNCTION PANEL
ALT: 56 U/L — ABNORMAL HIGH (ref 0–44)
AST: 62 U/L — ABNORMAL HIGH (ref 15–41)
Albumin: 4 g/dL (ref 3.5–5.0)
Alkaline Phosphatase: 41 U/L (ref 38–126)
Bilirubin, Direct: 0.2 mg/dL (ref 0.0–0.2)
Indirect Bilirubin: 1.3 mg/dL — ABNORMAL HIGH (ref 0.3–0.9)
Total Bilirubin: 1.5 mg/dL — ABNORMAL HIGH (ref <1.2)
Total Protein: 7 g/dL (ref 6.5–8.1)

## 2023-08-02 LAB — BASIC METABOLIC PANEL
Anion gap: 15 (ref 5–15)
BUN: 13 mg/dL (ref 8–23)
CO2: 17 mmol/L — ABNORMAL LOW (ref 22–32)
Calcium: 8.7 mg/dL — ABNORMAL LOW (ref 8.9–10.3)
Chloride: 103 mmol/L (ref 98–111)
Creatinine, Ser: 1.03 mg/dL (ref 0.61–1.24)
GFR, Estimated: >60 mL/min (ref >60)
Glucose, Bld: 116 mg/dL — ABNORMAL HIGH (ref 70–99)
Potassium: 3 mmol/L — ABNORMAL LOW (ref 3.5–5.1)
Sodium: 135 mmol/L (ref 135–145)

## 2023-08-02 LAB — CBC
HCT: 39 % (ref 39.0–52.0)
Hemoglobin: 13.7 g/dL (ref 13.0–17.0)
MCH: 34.9 pg — ABNORMAL HIGH (ref 26.0–34.0)
MCHC: 35.1 g/dL (ref 30.0–36.0)
MCV: 99.2 fL (ref 80.0–100.0)
Platelets: 179 10*3/uL (ref 150–400)
RBC: 3.93 MIL/uL — ABNORMAL LOW (ref 4.22–5.81)
RDW: 12.6 % (ref 11.5–15.5)
WBC: 5.5 10*3/uL (ref 4.0–10.5)
nRBC: 0 % (ref 0.0–0.2)

## 2023-08-02 LAB — HIV ANTIBODY (ROUTINE TESTING W REFLEX): HIV Screen 4th Generation wRfx: NONREACTIVE

## 2023-08-02 LAB — MAGNESIUM: Magnesium: 1.9 mg/dL (ref 1.7–2.4)

## 2023-08-02 LAB — GLUCOSE, CAPILLARY
Glucose-Capillary: 123 mg/dL — ABNORMAL HIGH (ref 70–99)
Glucose-Capillary: 129 mg/dL — ABNORMAL HIGH (ref 70–99)

## 2023-08-02 LAB — PHOSPHORUS: Phosphorus: 3 mg/dL (ref 2.5–4.6)

## 2023-08-02 MED ORDER — NICOTINE 14 MG/24HR TD PT24
14.0000 mg | MEDICATED_PATCH | Freq: Every day | TRANSDERMAL | Status: DC
  Administered 2023-08-02 – 2023-08-03 (×2): 14 mg via TRANSDERMAL
  Filled 2023-08-02 (×2): qty 1

## 2023-08-02 MED ORDER — LORAZEPAM 1 MG PO TABS
1.0000 mg | ORAL_TABLET | Freq: Three times a day (TID) | ORAL | Status: DC | PRN
  Administered 2023-08-02 – 2023-08-03 (×3): 1 mg via ORAL
  Filled 2023-08-02 (×3): qty 1

## 2023-08-02 MED ORDER — CARMEX CLASSIC LIP BALM EX OINT: TOPICAL_OINTMENT | CUTANEOUS | Status: DC | PRN

## 2023-08-02 MED ORDER — POTASSIUM CHLORIDE 10 MEQ/100ML IV SOLN
10.0000 meq | INTRAVENOUS | Status: AC
  Administered 2023-08-02 (×4): 10 meq via INTRAVENOUS
  Filled 2023-08-02 (×4): qty 100

## 2023-08-02 MED ORDER — LORAZEPAM 1 MG PO TABS
1.0000 mg | ORAL_TABLET | Freq: Three times a day (TID) | ORAL | Status: DC
  Administered 2023-08-02 (×2): 1 mg via ORAL
  Filled 2023-08-02 (×2): qty 1

## 2023-08-02 MED ORDER — LORAZEPAM 2 MG/ML IJ SOLN
1.0000 mg | Freq: Three times a day (TID) | INTRAMUSCULAR | Status: DC | PRN
  Administered 2023-08-03: 2 mg via INTRAVENOUS
  Filled 2023-08-02: qty 1

## 2023-08-02 MED ORDER — POLYVINYL ALCOHOL 1.4 % OP SOLN
2.0000 [drp] | OPHTHALMIC | Status: DC | PRN
  Filled 2023-08-02: qty 15

## 2023-08-02 NOTE — TOC Progression Note (Signed)
Transition of Care [facility]) - Progression Note    Patient Details  Name: Vernon Fowler MRN: [id number] Date of Birth: 06/26/1956  [facility] of [facility]) CM/SW Contact  Marliss Coots, LCSW Phone Number: 08/02/2023, 12:04 PM  Clinical Narrative:     This CSW introduced herself and role to patient at bedside. CSW informed patient of TOC consult and offered substance use resources. Patient accepted CSW offer. CSW provided patient with social and emotional support in addition to substance use resource packet. Patient expressed interest in admitting to residential treatment program ([facility], [facility]) upon discharge.  Expected Discharge Plan: Home/Self Care Barriers to Discharge: Continued Medical Work up  Expected Discharge Plan and Services In-house Referral: Clinical Social Work     Living arrangements for the past 2 months: Single Family Home                                       Social Determinants of Health (SDOH) Interventions SDOH Screenings   Food Insecurity: No Food Insecurity (02/18/2023)  Housing: Low Risk  (02/18/2023)  Transportation Needs: No Transportation Needs (02/18/2023)  Utilities: Not At Risk (02/18/2023)  Alcohol Screen: Medium Risk (12/09/2017)  Depression (PHQ2-9): Low Risk  (02/20/2023)  Tobacco Use: Medium Risk (07/30/2023)    Readmission Risk Interventions     No data to display

## 2023-08-02 NOTE — Plan of Care (Signed)
  Problem: Safety: Goal: Non-violent Restraint(s) Outcome: Progressing   Problem: Education: Goal: Knowledge of General Education information will improve Description: Including pain rating scale, medication(s)/side effects and non-pharmacologic comfort measures Outcome: Progressing   Problem: Health Behavior/Discharge Planning: Goal: Ability to manage health-related needs will improve Outcome: Progressing   Problem: Clinical Measurements: Goal: Ability to maintain clinical measurements within normal limits will improve Outcome: Progressing Goal: Will remain free from infection Outcome: Progressing Goal: Diagnostic test results will improve Outcome: Progressing Goal: Respiratory complications will improve Outcome: Progressing Goal: Cardiovascular complication will be avoided Outcome: Progressing   Problem: Activity: Goal: Risk for activity intolerance will decrease Outcome: Progressing   Problem: Nutrition: Goal: Adequate nutrition will be maintained Outcome: Progressing
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
## 2023-08-02 NOTE — Consult Note (Signed)
[facility]iatry Consult Evaluation  Service Date: August 02, 2023 LOS:  LOS: 1 day    Primary Psychiatric Diagnoses  Alcohol use disorder, severe, dependence GAD  Assessment  Vernon Fowler is a 67 y.o. male admitted medically for 07/30/2023  3:56 PM for alcohol withdrawal. He carries the psychiatric diagnoses of alcohol use disorder and GAD and has a past medical history of alcoholic liver disease, Dupuytren contracture, thyroid cancer status post thyroidectomy, PUD, GERD, [facility], hyperlipidemia, hypertension, CAD, OSA on CPAP, RLS. Psychiatry was consulted for positive suicide screening by Dr. Levon Hedger.   His current presentation of alcohol withdrawal is most consistent with alcohol disorder, severe, dependence, which he has a long history of.  He has trialed various craving medications in the past including naltrexone and acamprosate with mild benefit.  He would prefer to restart Vivitrol, but we cannot feasibly restart that in the hospital.  We discussed options including naltrexone, acamprosate, gabapentin (with switch pregabalin just prior to return to use), and disulfiram, and we will restarted agent after patient has had time to consider.  He has extensive history of rehab including residential and intensive outpatient, and he is unclear currently if he will pursue rehab following this hospitalization.  If he wishes to proceed with rehab, we will involve social work.   There is concerned that patient had positive suicide screen.  We have been unable to find documentation for this.  On our assessment, he is low suicide risk.  Furthermore he has good social support including fianc and is very connected in the community.  He has no access to weapons.  He reports protective factors and having children who he lives for.  He has no past history of suicide attempts or psychiatric hospitalizations.  He does not currently meet criteria for MDD.  He does have a history of GAD  for which he takes Prozac and has been adherent to. Patient has outpatient follow-up scheduled this month with his psychiatrist, we will defer to them for any adjustments.     With regard to patient's nutritional status, he reports that he had been eating less at last several weeks and in general gets a lot of calories from alcohol.  In the context of his current confusion (disorientation, short-term memory impairment, preserved attention), empirically started high-dose thiamine for suspicion of Warnicke encephalopathy.  Gait was deferred on exam and no obvious ocular abnormalities present.  No evidence of confabulations, executive dysfunction, or obvious amnesia suggestive of Korsakoff syndrome.  Additionally recommending delirium precautions in the context of acute confusion including having family bring his reading glasses from home.  Diagnoses:  Active Hospital problems: Principal Problem:   Alcohol use disorder, severe, dependence (HCC) Active Problems:   Hypertension   GERD (gastroesophageal reflux disease)   GAD (generalized anxiety disorder)   Alcohol withdrawal (HCC)   Acquired hypothyroidism   Barrett's esophagus   Hyperlipidemia   Delirium tremens (HCC)   Hypophosphatemia   Depression     Plan   ## Psychiatric Medication Recommendations:  -- Continue high-dose thiamine for empiric treatment of Wernicke encephalopathy -- Continue fluoxetine 20 mg once daily for GAD -- Will discuss starting craving medication for AUD tomorrow  ## Medical Decision Making Capacity:  None formally assessed  ## Further Work-up:  -- continue to monitor liver function if starting naltrexone   -- QT of 430 on 02/18/23 -- Pertinent labwork reviewed earlier this admission includes:    ## Disposition:  -- There are no psychiatric  contraindications to discharge at this time  ## Behavioral / Environmental:  --  Delirium Precautions: Delirium Interventions for Nursing and Staff: - RN to open  blinds every AM. - To Bedside: Glasses, hearing aide, and pt's own shoes. Make available to patients. when possible and encourage use. - Encourage po fluids when appropriate, keep fluids within reach. - OOB to chair with meals. - Passive ROM exercises to all extremities with AM & PM care. - RN to assess orientation to person, time and place QAM and PRN. - Recommend extended visitation hours with familiar family/friends as feasible. - Staff to minimize disturbances at night. Turn off television when pt asleep or when not in use.     ## Safety and Observation Level:  - Based on my clinical evaluation, I estimate the patient to be at low risk of self harm in the current setting - At this time, we recommend a low level of observation. This decision is based on my review of the chart including patient's history and current presentation, interview of the patient, mental status examination, and consideration of suicide risk including evaluating suicidal ideation, plan, intent, suicidal or self-harm behaviors, risk factors, and protective factors. This judgment is based on our ability to directly address suicide risk, implement suicide prevention strategies and develop a safety plan while the patient is in the clinical setting. Please contact our team if there is a concern that risk level has changed.  Suicide risk assessment  Patient has following modifiable risk factors for suicide: Alcohol use, which we are addressing by discussing restarting alcohol craving medication and next steps for behavioral interventions such as rehab.   Patient has following non-modifiable or demographic risk factors for suicide: male gender  Patient has the following protective factors against suicide: Access to outpatient mental health care, Supportive family, no history of suicide attempts, and no history of NSSIB   Thank you for this consult request. Recommendations have been communicated to the primary team.  We will  continue to follow at this time.   Meryl Dare, MD  Psychiatric and Social History   Relevant Aspects of Hospital Course:  Admitted on 07/30/2023 for alcohol withdrawal.  CIWA score significant elevated and was admitted to the ICU and initiated on fixed dose Ativan regimen with improvement in CIWA's.  There was concern for a positive suicide screen (which could not be found in EMR) and psychiatry was consulted.   Patient Report:  Pt seen in AM. He is oriented to self and to situation. Says it is Jan 3 but self-corrects quickly. Able to do MOYB through June with no issues. On 3 word recall he gets 0/3 at 5 minutes and 1/3 with prompting. Summarizes prior EtOH treatment. Does not recall making any suicidal statements. Was switched from fluoxetine to sertraline recently. Has very fuzzy memory of last 24 hours - thinks his fiancee/wife might have visited him but is really unsure. He gives [place] permission to call his fiancee who is 6 years sober.    Regarding alcohol cessation, has some ambivalence -feels his current life is less worth living, but is highly motivated to quit. Used to really enjoy exercise/marathons which "helped with endorphins". Has some disillusionment with EtOH treatment - "I could teach IOP I've been to so many".  With regard to medications, Gabapentin made him feel better, helped with restless legs, Switched to pregabalin because someone told him it would help more. Acamprosate did not help with anything. Naltrexone only helps when he is on vivitrol -  skips it on drinking nights because "I want to feel the buzz tonight". Lists indication and side effects of naltrexone/vivitrol without it. Has never tried disulfiram. Had been on busprione but didn't give it a chance "I was expecting it to be like valium." Open to oral naltrexone but wants to talk to someone outside the psychiatry team.    Regarding nutrition recently, Thinks calories mostly from food, but then states once he quit  drinking he lost weight. Was not eating much in 2 weeks before he came in.  Discussed that we start him on a medication for  Someone prescribed him buproprion recently 10/4. Seeing regular Dr. In couple weeks to discuss general health.        No SI, HI. Recalls hallucinations in ED, none since.      Psych ROS:  Depression: General sense of ennui/worthlessness. Appears to be tied to recent relapse.   Anxiety:  Yes - primary dx. Catastrophizes - discusses rumination Mania (lifetime and current): denies Psychosis: (lifetime and current): hx psychotic sx only in context of etoh w/d.   Collateral information:  Patient gave consent to contact Geraldine Contras (fiance, [phone]) and we were unable to get in contact with her but will try again tomorrow morning or get consent to contact someone else.  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 No religious beliefs about suicide Access to weapons: no guns   Substance History Tobacco use: started during AA/fellowship hall. Trouble quantifying use.  Alcohol use: Patient admitted he drank 2 1/2 bottles of wine and half a pint of Vodka this morning before coming to the ER. Sometimes more. He also added that he drinks same amount daily - drinking at that level for about 6  months.  Drug use: denies.    Exam Findings   Psychiatric Specialty Exam:  Presentation  General Appearance: Appropriate for Environment  Eye Contact:Good  Speech:Normal Rate  Speech Volume:Normal  Handedness:Right   Mood and Affect  Mood:Euthymic  Affect:Congruent; Full Range   Thought Process  Thought Processes:Linear  Descriptions of Associations:Intact  Orientation:Partial  Thought Content:Logical  Hallucinations:Hallucinations: None Description of Visual Hallucinations: Experienced hallucinations of children during early withdrawal phases but not currently  Ideas of Reference:None  Suicidal Thoughts:Suicidal Thoughts: No  Homicidal Thoughts:Homicidal Thoughts: No   Sensorium  Memory:Immediate Fair; Remote Fair; Recent Poor  Judgment:Poor  Insight:Fair (Understands his disease and that he needs help but cannot seem to remain in recovery.)   Executive Functions  Concentration:Fair  Attention Span:Fair  Recall:Fair  Fund of Knowledge:Good  Language:Good   Psychomotor Activity  Psychomotor Activity:Psychomotor Activity: Normal   Assets  Assets:Social Support; Desire for Improvement   Sleep  Sleep:Sleep: Good    Physical Exam: Vital signs:  Temp:  [97.5 F (36.4 C)-98.7 F (37.1 C)] 98.7 F (37.1 C) (12/03 1551) Pulse Rate:  [42-64] 64 (12/03 1800) Resp:  [  11-27] 21 (12/03 1800) BP: (98-169)/(72-100) 138/76 (12/03 1800) SpO2:  [94 %-100 %] 97 % (12/03 1800) Physical Exam Vitals and nursing note reviewed.  HENT:     Head: Normocephalic and atraumatic.  Pulmonary:     Effort: Pulmonary effort is normal.  Neurological:     General: No focal deficit present.     Mental Status: He is alert.     Comments: Mental status: Alert and oriented person place and time.  Attention intact as evident by months in reverse.  Recalled 0/3 objects in 5 minutes, 1/3 with prompting.      Blood pressure 138/76, pulse 64, temperature 98.7 F  (37.1 C), temperature source Oral, resp. rate (!) 21, height 6\' 2"  (1.88 m), weight 98.9 kg, SpO2 97%. Body mass index is 27.99 kg/m.   Other History   These have been pulled in through the EMR, reviewed, and updated if appropriate.   Family History:  The patient's family history includes Anxiety disorder in his daughter and mother; Cancer in his maternal grandmother, mother, and paternal grandmother; Diabetes in his father; Heart disease in his paternal grandfather; Hyperlipidemia in his father; Hypertension in his father.  Medical History: Past Medical History:  Diagnosis Date   Alcohol abuse    Anxiety    Cancer (HCC)    Depression 08/01/2023   Dupuytren contracture 12/23/2017   GERD (gastroesophageal reflux disease)    Hyperlipidemia    Hypertension    PUD (peptic ulcer disease) 10/01/2011   SBO (small bowel obstruction) (HCC) 12/13/2020   Sleep apnea    w/CPAP   Thyroid disease     Surgical History: Past Surgical History:  Procedure Laterality Date   COLONOSCOPY     COLONOSCOPY  04/2016   [place] polyps   FASCIOTOMY Right 10/2019   LAPAROTOMY N/A 12/13/2020   Procedure: EXPLORATORY LAPAROTOMY, LYSIS OF ADHESIONS;  Surgeon: Romie Levee, MD;  Location: [facility];  Service: General;  Laterality: N/A;   SHOULDER ARTHROSCOPY WITH ROTATOR CUFF REPAIR Right 03/01/2019   Procedure: Right shoulder arthroscopy, subacromial decompression, distal clavicle resection, rotator cuff repair;  Surgeon: Francena Hanly, MD;  Location: [facility];  Service: Orthopedics;  Laterality: Right;   SMALL INTESTINE SURGERY  12/13/2020   SBO   THYROIDECTOMY     1987   UPPER GASTROINTESTINAL ENDOSCOPY      Medications:   Current Facility-Administered Medications:    acetaminophen (TYLENOL) tablet 650 mg, 650 mg, Oral, Q6H PRN **OR** acetaminophen (TYLENOL) suppository 650 mg, 650 mg, Rectal, Q6H PRN, Bobette Mo, MD   Chlorhexidine Gluconate Cloth 2 % PADS 6 each, 6 each, Topical,  Daily, Lorin Glass, MD, 6 each at 08/02/23 1251   dexmedetomidine (PRECEDEX) 400 MCG/100ML (4 mcg/mL) infusion, 0-1.2 mcg/kg/hr, Intravenous, Titrated, Lorin Glass, MD, Stopped at 08/02/23 1023   enoxaparin (LOVENOX) injection 40 mg, 40 mg, Subcutaneous, Q24H, Lorin Glass, MD, 40 mg at 08/02/23 1218   FLUoxetine (PROZAC) capsule 20 mg, 20 mg, Oral, Daily, Tanda Rockers A, DO, 20 mg at 08/02/23 1218   haloperidol lactate (HALDOL) injection 5 mg, 5 mg, Intravenous, Q6H PRN, Bobette Mo, MD, 5 mg at 08/01/23 0859   irbesartan (AVAPRO) tablet 150 mg, 150 mg, Oral, Daily PRN, Tanda Rockers A, DO   labetalol (NORMODYNE) injection 20 mg, 20 mg, Intravenous, Q2H PRN, Bobette Mo, MD   levothyroxine (SYNTHROID) tablet 175 mcg, 175 mcg, Oral, Q0600, Tanda Rockers A, DO, 175 mcg at 08/02/23 1610   lip balm (  CARMEX) ointment, , Topical, PRN, Oretha Milch, MD   LORazepam (ATIVAN) tablet 1-4 mg, 1-4 mg, Oral, Q8H PRN, 1 mg at 08/02/23 1552 **OR** LORazepam (ATIVAN) injection 1-4 mg, 1-4 mg, Intravenous, Q8H PRN, Francena Hanly, RPH   LORazepam (ATIVAN) tablet 1 mg, 1 mg, Oral, TID, Oretha Milch, MD, 1 mg at 08/02/23 1552   multivitamin with minerals tablet 1 tablet, 1 tablet, Oral, Daily, Sloan Leiter, DO, 1 tablet at 08/02/23 1218   nicotine (NICODERM CQ - dosed in mg/24 hours) patch 14 mg, 14 mg, Transdermal, Daily, Paliwal, Aditya, MD, 14 mg at 08/02/23 0911   nicotine (NICODERM CQ - dosed in mg/24 hours) patch 14 mg, 14 mg, Transdermal, Daily, Cinderella, Margaret A, 14 mg at 08/02/23 1226   ondansetron (ZOFRAN) tablet 4 mg, 4 mg, Oral, Q6H PRN **OR** ondansetron (ZOFRAN) injection 4 mg, 4 mg, Intravenous, Q6H PRN, Bobette Mo, MD   Oral care mouth rinse, 15 mL, Mouth Rinse, 4 times per day, Lorin Glass, MD, 15 mL at 08/02/23 1555   Oral care mouth rinse, 15 mL, Mouth Rinse, PRN, Lorin Glass, MD   pantoprazole (PROTONIX) EC tablet 40 mg, 40 mg, Oral, Daily,  Tanda Rockers A, DO, 40 mg at 08/02/23 1218   polyvinyl alcohol (LIQUIFILM TEARS) 1.4 % ophthalmic solution 2 drop, 2 drop, Both Eyes, PRN, Paliwal, Aditya, MD   propranolol (INDERAL) tablet 20 mg, 20 mg, Oral, BID, Tanda Rockers A, DO, 20 mg at 07/31/23 2225   QUEtiapine (SEROQUEL) tablet 50 mg, 50 mg, Oral, QHS, Tanda Rockers A, DO, 50 mg at 07/31/23 2227   rOPINIRole (REQUIP) tablet 1 mg, 1 mg, Oral, QHS, Tanda Rockers A, DO, 1 mg at 07/31/23 2225   thiamine (VITAMIN B1) 500 mg in sodium chloride 0.9 % 50 mL IVPB, 500 mg, Intravenous, TID, Stopped at 08/02/23 1649 **FOLLOWED BY** [START ON 08/05/2023] thiamine (VITAMIN B1) tablet 100 mg, 100 mg, Oral, Daily, Cinderella, Margaret A  Allergies: Allergies  Allergen Reactions   Sulfa Antibiotics Rash    Fixed based skin reaction

## 2023-08-02 NOTE — Consult Note (Shared)
I am scribing this as a favor to Dr. Gilman Buttner; will be deleted from EMR.   Pt seen in AM. He is oriented to self and to situation. Says it is Jan 3 but self-corrects quickly. Able to do MOYB through June with no issues. On 3 word recall he gets 0/3 at 5 minutes and 1/3 with prompting. Summarizes prior EtOH treatment. Does not recall making any suicidal statements. Was switched from fluoxetine to sertraline recently. Has very fuzzy memory of last 24 hours - thinks his fiancee/wife might have visited him but is really unsure. He gives [place] permission to call his fiancee who is 6 years sober.   Has some ambivalence -feels his current life is less worth living, but is highly motivated to quit. Used to really enjoy exercise/marathons which "helped with endorphins". Has some disillusionment with EtOH treatment - "I could teach IOP I've been to so many".   Someone prescribed him buproprion recently 10/4. Seeing regular Dr. In couple weeks to discuss general health.   Gabapentin for EtOH - made him feel better, helped with restless legs, Switched to pregabalin because someone told him it would help more. Acamprosate did not help with anything. Naltrexone only helps when he is on vivitrol - skips it on drinking nights because "I want to feel the buzz tonight". Lists indication and side effects of naltrexone/vivitrol without it. Has never tried disulfiram. Had been on busprione but didn't give it a chance "I was expecting it to be like valium"  Open to oral naltrexone but wants to talk to someone outside the psychiatry team.   No SI, HI. Recalls hallucinations in ED, none since.   Thinks calories mostly from food, but then states once he quit drinking he lost weight. Was not eating much in 2 weeks before he came in.   Would advocate for familiar objects such as pt's phone, toothbrush, etc be brought to him in the recs for delirium.   Psych ROS:  Depression: General sense of ennui/worthlessness. Appears to be  tied to recent relapse.   Anxiety:  Yes - primary dx. Catastrophizes - discusses rumination Mania (lifetime and current): denies Psychosis: (lifetime and current): hx psychotic sx only in context of etoh w/d.   Collateral information:  Contacted *** at *** on ***  Psychiatric History:  Information collected from pt, medical record  Prev Dx/Sx: EtOH use d/o, severe, GAD Current Psych Provider: Sharlyne Pacas, FNP Home Meds (current): Fluoxetine (only been on a couple months) Previous Med Trials: fluoxetine, sertraline Therapy: recently in CD-IOP, July of 2024, AA  Prior ECT: denied Prior Psych Hospitalization: all rehab, no psych hospital Prior Self Harm: denied Prior Violence: denied  Family Psych History: MGM w/ etoh use d/o, Family Hx suicide: possibly MGM  Social History:  Developmental Hx: not obtained Educational Hx: 4 years college 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 No religious beliefs about suicide Access to weapons: no guns  Substance History Tobacco use: started during AA/fellowship hall. Trouble quantifying use.  Alcohol use: Patient admitted he drank 2 1/2 bottles of wine and half a pint of Vodka this morning before coming to the ER. Sometimes more. He also added that he drinks same amount daily - drinking at that level for about 6 months.  Drug use: denies.

## 2023-08-02 NOTE — Progress Notes (Signed)
eLink Physician-Brief Progress Note Patient Name: Vernon Fowler DOB: 11-Sep-1955 MRN: [id number]   Date of Service  08/02/2023  HPI/Events of Note  Ongoing urinary retention, bladder scan with greater than 500 cc.  eICU Interventions  Urinary retention guidelines.  In-N-Out cath as needed.     Intervention Category Minor Interventions: Routine modifications to care plan (e.g. PRN medications for pain, fever)  Vernon Fowler 08/02/2023, 2:38 AM

## 2023-08-03 DIAGNOSIS — F10932 Alcohol use, unspecified with withdrawal with perceptual disturbance: Secondary | ICD-10-CM | POA: Diagnosis not present

## 2023-08-03 DIAGNOSIS — F1020 Alcohol dependence, uncomplicated: Secondary | ICD-10-CM | POA: Diagnosis not present

## 2023-08-03 LAB — BASIC METABOLIC PANEL
Anion gap: 9 (ref 5–15)
BUN: 10 mg/dL (ref 8–23)
CO2: 19 mmol/L — ABNORMAL LOW (ref 22–32)
Calcium: 8.9 mg/dL (ref 8.9–10.3)
Chloride: 106 mmol/L (ref 98–111)
Creatinine, Ser: 1.05 mg/dL (ref 0.61–1.24)
GFR, Estimated: >60 mL/min (ref >60)
Glucose, Bld: 93 mg/dL (ref 70–99)
Potassium: 3.4 mmol/L — ABNORMAL LOW (ref 3.5–5.1)
Sodium: 134 mmol/L — ABNORMAL LOW (ref 135–145)

## 2023-08-03 LAB — CBC
HCT: 40.5 % (ref 39.0–52.0)
Hemoglobin: 14.7 g/dL (ref 13.0–17.0)
MCH: 35.3 pg — ABNORMAL HIGH (ref 26.0–34.0)
MCHC: 36.3 g/dL — ABNORMAL HIGH (ref 30.0–36.0)
MCV: 97.1 fL (ref 80.0–100.0)
Platelets: 209 10*3/uL (ref 150–400)
RBC: 4.17 MIL/uL — ABNORMAL LOW (ref 4.22–5.81)
RDW: 12.6 % (ref 11.5–15.5)
WBC: 6.3 10*3/uL (ref 4.0–10.5)
nRBC: 0 % (ref 0.0–0.2)

## 2023-08-03 LAB — PHOSPHORUS: Phosphorus: 2.6 mg/dL (ref 2.5–4.6)

## 2023-08-03 LAB — MAGNESIUM: Magnesium: 1.9 mg/dL (ref 1.7–2.4)

## 2023-08-03 LAB — FERRITIN: Ferritin: 324 ng/mL (ref 24–336)

## 2023-08-03 MED ORDER — POTASSIUM CHLORIDE CRYS ER 20 MEQ PO TBCR
60.0000 meq | EXTENDED_RELEASE_TABLET | Freq: Once | ORAL | Status: AC
  Administered 2023-08-03: 60 meq via ORAL
  Filled 2023-08-03: qty 3

## 2023-08-03 MED ORDER — IRBESARTAN 150 MG PO TABS: 150.0000 mg | ORAL_TABLET | Freq: Every day | ORAL | Status: DC

## 2023-08-03 MED ORDER — GABAPENTIN 300 MG PO CAPS
600.0000 mg | ORAL_CAPSULE | Freq: Three times a day (TID) | ORAL | Status: DC
  Administered 2023-08-03 (×2): 600 mg via ORAL
  Filled 2023-08-03 (×2): qty 2

## 2023-08-03 MED ORDER — MAGNESIUM SULFATE 2 GM/50ML IV SOLN
2.0000 g | Freq: Once | INTRAVENOUS | Status: AC
  Administered 2023-08-03: 2 g via INTRAVENOUS
  Filled 2023-08-03: qty 50

## 2023-08-03 NOTE — Plan of Care (Signed)
  Problem: Activity: Goal: Risk for activity intolerance will decrease Outcome: Progressing   Problem: Nutrition: Goal: Adequate nutrition will be maintained Outcome: Progressing   Problem: Education: Goal: Knowledge of General Education information will improve Description: Including pain rating scale, medication(s)/side effects and non-pharmacologic comfort measures Outcome: Not Progressing   Problem: Health Behavior/Discharge Planning: Goal: Ability to manage health-related needs will improve Outcome: Not Progressing   Problem: Coping: Goal: Level of anxiety will decrease Outcome: Not Progressing

## 2023-08-03 NOTE — Progress Notes (Signed)
Patient advised by nursing staff and MDs not to leave against medical advise. Patient insisted he was leaving regardless of what we had to say, risks of leaving and benefits of staying explained by MDs. Patient was leaving hospital. Patient given AMA paperwork, this nurse removed his IVs. Patient left floor to exit hospital.
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
## 2023-08-03 NOTE — Discharge Summary (Signed)
Physician Discharge Summary  PATIENT LEFT AGAINST MEDICAL ADVICE Pt is a 67 year old male with past medical hx of alcohol use disorder with multiple hospitalization, anxiety, GAD, thyroid cancer s/p thyroidectomy, PUD, CAD, OSA on CPAP presented to the ED for treatment of alcohol withdrawal. Initially admitted to [facility] but PCCM consulted for precedex infusion. Pt improved but on day 3 decided to leave AMA in the afternoon. He had decision making capacity but was still in the withdrawal period so he singed AMA.        Patient ID: Vernon Fowler MRN: [id number] DOB/AGE: [birth year] 67 y.o.  Admit date: 07/30/2023 Discharge date: 08/03/2023  Discharge Diagnoses:    Active Hospital Problems   Diagnosis Date Noted   Alcohol use disorder, severe, dependence (HCC) 12/09/2017   Alcohol withdrawal (HCC) 08/01/2023   Delirium tremens (HCC) 08/01/2023   Hypophosphatemia 08/01/2023   Depression 08/01/2023   Barrett's esophagus 08/04/2018   Hyperlipidemia 01/08/2018   Acquired hypothyroidism 01/08/2018   Hypertension 10/28/2017   GERD (gastroesophageal reflux disease) 10/28/2017   GAD (generalized anxiety disorder) 10/28/2017    Resolved Hospital Problems  No resolved problems to display.    HPI by Dr. Scarlette Slice Hearn is a 67 y.o. male with medical history significant of alcohol abuse, with at least 14 detox hospitalizations and 7 rehab treatments, alcoholic liver disease, anxiety, depression, Dupuytren contracture, thyroid cancer, thyroidectomy, peptic ulcer disease, GERD, Barrett's esophagus, hyperlipidemia, hypertension, coronary artery atherosclerosis, aortic atherosclerosis, sleep apnea on CPAP, restless leg syndrome, epididymitis, small bowel obstruction who initially presented 2 days ago in the afternoon seeking treatment for detox.  He stated he has been drinking 2 to 3 pints of vodka and 1 bottle of wine daily recently.  He is currently restless despite receiving lorazepam 2 mg IVP  x 2.  He was oriented to name, date with some difficulty, no he was in [place], but did not know which facility he was.  He thought that he is left index finger was a popsicle and was trying to eat it.  He denied fever, chills, rhinorrhea, sore throat, wheezing or hemoptysis.  No chest pain, palpitations, diaphoresis, PND, orthopnea or pitting edema of the lower extremities.  No abdominal pain, nausea, emesis, diarrhea, constipation, melena or hematochezia.  No flank pain, dysuria, frequency or hematuria.  No polyuria, polydipsia, polyphagia or blurred vision.  Discharge Plan by Active Problems   Alcohol Withdrawal Alcohol use disorder Acute Encephalopathy Elevated liver enzymes Pt presented to the ED to detox. His last drink 12/02. Overall he has struggled with alcohol and had multiple relapses, states was sober for 19 months previously and 12 months recently. Initially started on precedex given his agitation but weaned off. CIWA improved with ativan and CIWA was 5 on the morning of discharge. Psychiatry was consulted and made recommendations. Pt initially wanted to stay in the hospital to get all the help and complete his detox so TOC was consulted to assist him. Pt had abruptly changed his mind and wanted to leave AMA in the afternoon. Prolonged counseling was given to him to prevent him from leaving but he decided to leave AMA. The risk of him were discussed and he understood the risk of him leaving. He was deemed to have decision making capacity and he decided to leave AMA. Advised him that he was in the withdrawal period and to prevent worsening withdrawal he should take his ativan that was prescribed to him. He stated he knew his body and was feeling great.  Hypokalemia: Repleted as needed.   HTN: elevated likely in setting of alcohol withdrawal. Will hold given some hypotensive episodes and waxing and waning nature given timing of anxiolytics. Will continue home beta blocker. ARB scheduled to  start tomorrow.  GAD/MDD: Continue home prozac, trazodone and seroquel. Continue home gabapenin.   HLD/CAD: continued home statin and aspirin.   GERD: Chronic problem. Continued home PPI OSA: was placed on nightly CPAP.   Hypothyroidism 2/2 to thyroidectomy: Was started on Synthroid.    Restless legs: His home ropinirole 1 mg was continued.   Tobacco use disorder: started on NRT.  Significant Hospital tests/ studies  CT Head Wo Contrast  Result Date: 07/30/2023 CLINICAL DATA:  Status post fall. EXAM: CT HEAD WITHOUT CONTRAST TECHNIQUE: Contiguous axial images were obtained from the base of the skull through the vertex without intravenous contrast. RADIATION DOSE REDUCTION: This exam was performed according to the departmental dose-optimization program which includes automated exposure control, adjustment of the mA and/or kV according to patient size and/or use of iterative reconstruction technique. COMPARISON:  None Available. FINDINGS: Brain: There is mild cerebral atrophy with widening of the extra-axial spaces and ventricular dilatation. There are areas of decreased attenuation within the white matter tracts of the supratentorial brain, consistent with microvascular disease changes. Vascular: Moderate severity bilateral cavernous carotid artery calcification is noted. Skull: Normal. Negative for fracture or focal lesion. Sinuses/Orbits: No acute finding. Other: None. IMPRESSION: 1. No acute intracranial abnormality. 2. Generalized cerebral atrophy and microvascular disease changes of the supratentorial brain. Electronically Signed   By: Aram Candela M.D.   On: 07/30/2023 19:55   DG Chest Port 1 View  Result Date: 07/30/2023 CLINICAL DATA:  psych clearance EXAM: PORTABLE CHEST 1 VIEW COMPARISON:  CT chest 07/09/2022 FINDINGS: Patient is rotated. The heart and mediastinal contours are within normal limits. No focal consolidation. No pulmonary edema. No pleural effusion. No pneumothorax.  No acute osseous abnormality. IMPRESSION: No active disease. Electronically Signed   By: Tish Frederickson M.D.   On: 07/30/2023 17:31   DG Lumbar Spine Complete  Result Date: 07/30/2023 CLINICAL DATA:  back pain post fall EXAM: LUMBAR SPINE - COMPLETE 4+ VIEW COMPARISON:  None Available. FINDINGS: Limited evaluation due to overlapping osseous structures and overlying soft tissues. There is no evidence of lumbar spine fracture. Alignment is normal. Intervertebral disc spaces are maintained. Atherosclerotic plaque of the aorta. IMPRESSION: 1. No acute displaced fracture or traumatic listhesis of the lumbar spine. 2.  Aortic Atherosclerosis (ICD10-I70.0). Electronically Signed   By: Tish Frederickson M.D.   On: 07/30/2023 17:31   CT Head Wo Contrast  Result Date: 07/30/2023 CLINICAL DATA:  Head trauma, minor (Age >= 65y) Patient is tearful at time of triage. Patient reports he is an alcoholic, drinks a 2-3 pints of vodka and 1 bottle of wine a day. Wants to detox. EXAM: CT HEAD WITHOUT CONTRAST TECHNIQUE: Contiguous axial images were obtained from the base of the skull through the vertex without intravenous contrast. RADIATION DOSE REDUCTION: This exam was performed according to the departmental dose-optimization program which includes automated exposure control, adjustment of the mA and/or kV according to patient size and/or use of iterative reconstruction technique. COMPARISON:  None Available. FINDINGS: Brain: Patchy and confluent areas of decreased attenuation are noted throughout the deep and periventricular white matter of the cerebral hemispheres bilaterally, compatible with chronic microvascular ischemic disease. No evidence of large-territorial acute infarction. No parenchymal hemorrhage. No mass lesion. No extra-axial collection. No mass effect or midline  shift. No hydrocephalus. Basilar cisterns are patent. Vascular: No hyperdense vessel. Skull: No acute fracture or focal lesion. Sinuses/Orbits:  Paranasal sinuses and mastoid air cells are clear. The orbits are unremarkable. Other: None. IMPRESSION: No acute intracranial abnormality. Electronically Signed   By: Tish Frederickson M.D.   On: 07/30/2023 17:05      Consults  Psychiatry: Made medication recommendation and recommended outpatient follow up.  Discharge Exam: BP (!) 147/101   Pulse 65   Temp 99.1 F (37.3 C) (Oral)   Resp (!) 21   Ht 6\' 2"  (1.88 m)   Wt 98.9 kg   SpO2 92%   BMI 27.99 kg/m   Labs at discharge   Lab Results  Component Value Date   CREATININE 1.05 08/03/2023   BUN 10 08/03/2023   NA 134 (L) 08/03/2023   K 3.4 (L) 08/03/2023   CL 106 08/03/2023   CO2 19 (L) 08/03/2023   Lab Results  Component Value Date   WBC 6.3 08/03/2023   HGB 14.7 08/03/2023   HCT 40.5 08/03/2023   MCV 97.1 08/03/2023   PLT 209 08/03/2023   Lab Results  Component Value Date   ALT 56 (H) 08/02/2023   AST 62 (H) 08/02/2023   ALKPHOS 41 08/02/2023   BILITOT 1.5 (H) 08/02/2023   No results found for: "INR", "PROTIME"  Current radiological studies    No results found.  Disposition:  PATIENT LEFT AGAINST MEDICAL ADVICE  Follow-up appointment   Pt to schedule follow up appointment. He states he will report to detox facility after visiting his house and running some errands.  Discharge Condition:    stable  Signed: Gwenevere Abbot 08/03/2023, 5:02 PM

## 2023-08-03 NOTE — Progress Notes (Deleted)
bath

## 2023-08-03 NOTE — Progress Notes (Signed)
Patient expresses desire to leave AMA. He is oriented to time place and person.  He is able to express a plan -will get dressed and call [facility].  Reports address correctly.  He requests his belongings. This is a change in plan from this morning.  He denies wanting or drink.  He states he will get home and review rehab facilities, he has been to multiple places in the past. We advised his sister.  He seems to have decision-making ability.  He has not expressed any suicidal ideas during this hospitalization he was assessed by psychiatry earlier today and they agree that he had decision making ability on their evaluation.   Comer Locket Vassie Loll MD

## 2023-08-03 NOTE — Consult Note (Addendum)
[facility] Consult Evaluation  Service Date: August 03, 2023 LOS:  LOS: 2 days    Primary Psychiatric Diagnoses  Alcohol use disorder, severe, dependence GAD  Assessment  Vernon Fowler is a 67 y.o. male admitted medically for 07/30/2023  3:56 PM for alcohol withdrawal. He carries the psychiatric diagnoses of alcohol use disorder and GAD and has a past medical history of alcoholic liver disease, Dupuytren contracture, thyroid cancer status post thyroidectomy, PUD, GERD, Barrett's office, hyperlipidemia, hypertension, CAD, OSA on CPAP, RLS. Psychiatry was consulted for positive suicide screening by Dr. Levon Hedger.   His current presentation of alcohol withdrawal is most consistent with alcohol disorder, severe, dependence, which he has a long history of.  He has trialed various craving medications in the past including naltrexone and acamprosate with mild benefit.  He would prefer to restart Vivitrol, but we cannot feasibly restart that in the hospital.  We discussed options including naltrexone, acamprosate, gabapentin (with switch pregabalin just prior to return to use), and disulfiram, and we will restarted agent after patient has had time to consider.  He has extensive history of rehab including residential and intensive outpatient. After discussion with social worker and his support (fiance and sister), he is looking to proceed with rehab and stay longer (90 days). Reports he usually has been for 30 days.  Patient will schedule based on list he received from the LCSW.  There was concern that patient had positive suicide screen.  We have been unable to find documentation for this.  On our assessment today, he continues to be low suicide risk.  Furthermore he has good social support including fianc and is very connected in the community.  He has no access to weapons.  He reports protective factors and having children who he lives for.  He has no past history of suicide attempts or  psychiatric hospitalizations.  He does not currently meet criteria for MDD.  He does have a history of GAD for which he takes Prozac and has been adherent to. Patient has outpatient follow-up scheduled this month with his psychiatrist, we will defer to them for any adjustments.     With regard to patient's nutritional status, he reports that he had been eating less at last several weeks and in general gets a lot of calories from alcohol.  In the context of his current confusion (disorientation, short-term memory impairment, preserved attention), empirically started high-dose thiamine for suspicion of Warnicke encephalopathy.  Gait was deferred on exam and no obvious ocular abnormalities present.  No evidence of confabulations, executive dysfunction, or obvious amnesia suggestive of Korsakoff syndrome.  Additionally recommending delirium precautions in the context of acute confusion including having family bring his reading glasses from home.  Diagnoses:  Active Hospital problems: Principal Problem:   Alcohol use disorder, severe, dependence (HCC) Active Problems:   Hypertension   GERD (gastroesophageal reflux disease)   GAD (generalized anxiety disorder)   Alcohol withdrawal (HCC)   Acquired hypothyroidism   Barrett's esophagus   Hyperlipidemia   Delirium tremens (HCC)   Hypophosphatemia   Depression     Plan   ## Psychiatric Medication Recommendations:  -- Continue high-dose thiamine for empiric treatment of Wernicke encephalopathy -- Continue fluoxetine 20 mg once daily for GAD -- Denies wanting to start current medications and will follow-up with psychiatry outpatient  ## Medical Decision Making Capacity:  None formally assessed  ## Further Work-up:  -- continue to monitor liver function if starting naltrexone   -- QT  of 430 on 02/18/23 -- Pertinent labwork reviewed earlier this admission includes:    ## Disposition:  -- There are no psychiatric contraindications to  discharge at this time  ## Behavioral / Environmental:  --  Delirium Precautions: Delirium Interventions for Nursing and Staff: - RN to open blinds every AM. - To Bedside: Glasses, hearing aide, and pt's own shoes. Make available to patients. when possible and encourage use. - Encourage po fluids when appropriate, keep fluids within reach. - OOB to chair with meals. - Passive ROM exercises to all extremities with AM & PM care. - RN to assess orientation to person, time and place QAM and PRN. - Recommend extended visitation hours with familiar family/friends as feasible. - Staff to minimize disturbances at night. Turn off television when pt asleep or when not in use.     ## Safety and Observation Level:  - Based on my clinical evaluation, I estimate the patient to be at low risk of self harm in the current setting - At this time, we recommend a low level of observation. This decision is based on my review of the chart including patient's history and current presentation, interview of the patient, mental status examination, and consideration of suicide risk including evaluating suicidal ideation, plan, intent, suicidal or self-harm behaviors, risk factors, and protective factors. This judgment is based on our ability to directly address suicide risk, implement suicide prevention strategies and develop a safety plan while the patient is in the clinical setting. Please contact our team if there is a concern that risk level has changed.  Suicide risk assessment  Patient has following modifiable risk factors for suicide: Alcohol use, which we are addressing by discussing restarting alcohol craving medication and next steps for behavioral interventions such as rehab.   Patient has following non-modifiable or demographic risk factors for suicide: male gender  Patient has the following protective factors against suicide: Access to outpatient mental health care, Supportive family, no history of  suicide attempts, and no history of NSSIB   Thank you for this consult request. Recommendations have been communicated to the primary team.  We will sign off at this time.   Meryl Dare, MD  I personally was present and performed or re-performed the history, physical exam and medical decision-making activities of this service and have verified that the service and findings are accurately documented in the student's note.  Meryl Dare, MD PGY-1 Psychiatry Resident 08/03/2023, 10:24 AM   Psychiatric and Social History   Relevant Aspects of Hospital Course:  Admitted on 07/30/2023 for alcohol withdrawal.  CIWA score significant elevated and was admitted to the ICU and initiated on fixed dose Ativan regimen with improvement in CIWA's.  There was concern for a positive suicide screen (which could not be found in EMR) and psychiatry was consulted.   Patient Report:  Pt seen in AM. He is oriented to self and to situation. Reports his family visited and they discussed that he may have to stay longer (90 days) in rehab this time. Has spoken with social work to figure out rehab centers. He was switched from fluoxetine to sertraline recently. After receiving phenobarbital, he reports he slept all day yesterday and was unable to sleep all night. But reports he feels good. Has good insight and judgement.     Regarding alcohol cessation, has some ambivalence -feels his current life is less worth living, but is highly motivated to quit. Used to really enjoy exercise/marathons which "helped with endorphins". Has some disillusionment  with EtOH treatment - "I could teach IOP I've been to so many".  With regard to medications, Gabapentin made him feel better, helped with restless legs, Switched to pregabalin because someone told him it would help more. Acamprosate did not help with anything. Naltrexone only helps when he is on vivitrol - skips it on drinking nights because "I want to feel the buzz tonight".  Lists indication and side effects of naltrexone/vivitrol without it. Has never tried disulfiram. Had been on busprione but didn't give it a chance "I was expecting it to be like valium." Open to oral naltrexone but wants to talk to someone outside the psychiatry team. Someone prescribed him buproprion recently 10/4. Seeing regular Dr. In couple weeks to discuss general health.  Today denied any meds for his cravings.    Was slightly concerned for elevated ferritin levels in the past and "has been misdiagnosed before."  No recent ferritin levels drawn. Will defer to his primary doctor as this would not affect his inpatient management.  No SI, HI. Recalls hallucinations in ED, none since.      Psych ROS:  Depression: General sense of ennui/worthlessness. Appears to be tied to recent relapse.   Anxiety:  Yes - primary dx. Catastrophizes - discusses rumination Mania (lifetime and current): denies Psychosis: (lifetime and current): hx psychotic sx only in context of etoh w/d.   Collateral information:  Patient gave consent to contact Geraldine Contras (fiance, [phone]) and we were unable to get in contact with her but will try again tomorrow morning or get consent to contact someone else.  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 No religious beliefs about suicide Access to weapons: no guns   Substance History Tobacco use: started during AA/fellowship hall. Trouble quantifying use.  Alcohol use: Patient admitted he drank 2 1/2 bottles of wine and half a pint of Vodka this morning before coming to the ER. Sometimes more. He also added that he drinks same amount daily - drinking at that level for about 6 months.  Drug use: denies.    Exam Findings   Psychiatric Specialty Exam:  Presentation  General Appearance: Appropriate for Environment  Eye Contact:Good  Speech:Normal Rate  Speech Volume:Normal  Handedness:Right   Mood and Affect  Mood:-- Peri Jefferson")  Affect:Appropriate   Thought Process  Thought Processes:Coherent  Descriptions of Associations:Intact  Orientation:-- (Not formally assessed.)  Thought Content:Logical  Hallucinations:Hallucinations: None Description of Visual Hallucinations: Experienced hallucinations of children during early withdrawal phases but not currently  Ideas of Reference:None  Suicidal Thoughts:Suicidal Thoughts: No  Homicidal Thoughts:Homicidal Thoughts: No   Sensorium  Memory:Immediate Good; Remote Good  Judgment:Good  Insight:Good   Executive Functions  Concentration:Good  Attention Span:-- (Not formally assessed.)  Recall:-- (Not formally assessed.)  Fund of Knowledge:-- (Not formally assessed.)  Language:-- (Not formally assessed.)   Psychomotor Activity  Psychomotor Activity:Psychomotor Activity: Normal  Assets  Assets:Desire for Improvement; Communication Skills; Social Support   Sleep  Sleep:Sleep: -- (Slept throughout the day yesterday and did not sleep at night.)    Physical Exam: Vital signs:  Temp:  [98.5 F (36.9 C)-99.5 F (37.5 C)] 98.7 F (37.1 C) (12/04 0725) Pulse Rate:  [52-69] 59 (12/04 0527) Resp:  [16-29] 28 (12/03 2330) BP: (132-165)/(72-98) 144/94 (12/04 0527) SpO2:  [86 %-99 %] 94 % (12/03  2330) Physical Exam Vitals and nursing note reviewed.  HENT:     Head: Normocephalic and atraumatic.  Pulmonary:     Effort: Pulmonary effort is normal.  Neurological:     General: No focal deficit present.     Mental Status: He is alert.     Comments: Mental status: Alert and oriented person place and time.  Attention intact as evident by months in reverse.  Memory improved from yesterday as evident by conversation.     Blood pressure (!) 144/94, pulse (!) 59, temperature 98.7 F (37.1 C), temperature source Oral, resp. rate (!) 28, height 6\' 2"  (1.88 m), weight 98.9 kg, SpO2 94%. Body mass index is 27.99 kg/m.   Other History   These have been pulled in through the EMR, reviewed, and updated if appropriate.   Family History:  The patient's family history includes Anxiety disorder in his daughter and mother; Cancer in his maternal grandmother, mother, and paternal grandmother; Diabetes in his father; Heart disease in his paternal grandfather; Hyperlipidemia in his father; Hypertension in his father.  Medical History: Past Medical History:  Diagnosis Date   Alcohol abuse    Anxiety    Cancer (HCC)    Depression 08/01/2023   Dupuytren contracture 12/23/2017   GERD (gastroesophageal reflux disease)    Hyperlipidemia    Hypertension    PUD (peptic ulcer disease) 10/01/2011   SBO (small bowel obstruction) (HCC) 12/13/2020   Sleep apnea    w/CPAP   Thyroid disease     Surgical History: Past Surgical History:  Procedure Laterality Date   COLONOSCOPY     COLONOSCOPY  04/2016   [place] polyps   FASCIOTOMY Right 10/2019   LAPAROTOMY N/A 12/13/2020   Procedure: EXPLORATORY LAPAROTOMY, LYSIS OF ADHESIONS;  Surgeon: Romie Levee, MD;  Location: [facility];  Service: General;  Laterality: N/A;   SHOULDER ARTHROSCOPY WITH ROTATOR CUFF REPAIR Right 03/01/2019   Procedure: Right shoulder arthroscopy, subacromial decompression, distal clavicle resection, rotator cuff repair;   Surgeon: Francena Hanly, MD;  Location: [facility];  Service: Orthopedics;  Laterality: Right;   SMALL INTESTINE SURGERY  12/13/2020   SBO   THYROIDECTOMY     1987   UPPER GASTROINTESTINAL ENDOSCOPY      Medications:   Current Facility-Administered Medications:    acetaminophen (TYLENOL) tablet 650 mg, 650 mg, Oral, Q6H PRN **OR** acetaminophen (TYLENOL) suppository 650 mg, 650 mg, Rectal, Q6H PRN, Bobette Mo, MD   Chlorhexidine Gluconate Cloth 2 % PADS 6 each, 6 each, Topical, Daily, Lorin Glass, MD, 6 each at 08/03/23 0857   enoxaparin (LOVENOX) injection 40 mg, 40 mg, Subcutaneous, Q24H, Lorin Glass, MD, 40 mg at 08/03/23 0856   FLUoxetine (PROZAC) capsule 20 mg, 20 mg, Oral, Daily, Tanda Rockers A, DO, 20 mg at 08/03/23 0827   gabapentin (NEURONTIN) capsule 600 mg, 600 mg, Oral, TID, Gwenevere Abbot, MD, 600 mg at 08/03/23 0827   irbesartan (AVAPRO) tablet 150 mg, 150 mg, Oral, Daily PRN, Tanda Rockers A, DO   labetalol (NORMODYNE) injection 20  mg, 20 mg, Intravenous, Q2H PRN, Bobette Mo, MD   levothyroxine (SYNTHROID) tablet 175 mcg, 175 mcg, Oral, Q0600, Sloan Leiter, DO, 175 mcg at 08/03/23 0511   lip balm (CARMEX) ointment, , Topical, PRN, Oretha Milch, MD   LORazepam (ATIVAN) tablet 1-4 mg, 1-4 mg, Oral, Q8H PRN, 1 mg at 08/03/23 0510 **OR** LORazepam (ATIVAN) injection 1-4 mg, 1-4 mg, Intravenous, Q8H PRN, Francena Hanly, RPH   multivitamin with minerals tablet 1 tablet, 1 tablet, Oral, Daily, Sloan Leiter, DO, 1 tablet at 08/03/23 1610   nicotine (NICODERM CQ - dosed in mg/24 hours) patch 14 mg, 14 mg, Transdermal, Daily, Paliwal, Aditya, MD, 14 mg at 08/03/23 0859   nicotine (NICODERM CQ - dosed in mg/24 hours) patch 14 mg, 14 mg, Transdermal, Daily, Cinderella, Margaret A, 14 mg at 08/03/23 0859   ondansetron (ZOFRAN) tablet 4 mg, 4 mg, Oral, Q6H PRN **OR** ondansetron (ZOFRAN) injection 4 mg, 4 mg, Intravenous, Q6H PRN, Bobette Mo, MD   Oral  care mouth rinse, 15 mL, Mouth Rinse, 4 times per day, Lorin Glass, MD, 15 mL at 08/03/23 9604   Oral care mouth rinse, 15 mL, Mouth Rinse, PRN, Lorin Glass, MD   pantoprazole (PROTONIX) EC tablet 40 mg, 40 mg, Oral, Daily, Tanda Rockers A, DO, 40 mg at 08/03/23 5409   polyvinyl alcohol (LIQUIFILM TEARS) 1.4 % ophthalmic solution 2 drop, 2 drop, Both Eyes, PRN, Paliwal, Aditya, MD   propranolol (INDERAL) tablet 20 mg, 20 mg, Oral, BID, Tanda Rockers A, DO, 20 mg at 08/03/23 8119   QUEtiapine (SEROQUEL) tablet 50 mg, 50 mg, Oral, QHS, Tanda Rockers A, DO, 50 mg at 08/02/23 2127   rOPINIRole (REQUIP) tablet 1 mg, 1 mg, Oral, QHS, Tanda Rockers A, DO, 1 mg at 08/02/23 2127   thiamine (VITAMIN B1) 500 mg in sodium chloride 0.9 % 50 mL IVPB, 500 mg, Intravenous, TID, Last Rate: 110 mL/hr at 08/03/23 0855, 500 mg at 08/03/23 0855 **FOLLOWED BY** [START ON 08/05/2023] thiamine (VITAMIN B1) tablet 100 mg, 100 mg, Oral, Daily, Cinderella, Margaret A  Allergies: Allergies  Allergen Reactions   Sulfa Antibiotics Rash    Fixed based skin reaction

## 2023-08-09 ENCOUNTER — Other Ambulatory Visit (HOSPITAL_COMMUNITY): Payer: Self-pay

## 2023-11-17 ENCOUNTER — Emergency Department (HOSPITAL_COMMUNITY)

## 2023-11-17 ENCOUNTER — Encounter (HOSPITAL_COMMUNITY): Payer: Self-pay

## 2023-11-17 ENCOUNTER — Emergency Department (HOSPITAL_COMMUNITY)
Admission: EM | Admit: 2023-11-17 | Discharge: 2023-11-17 | Disposition: A | Discharge status: Home / Self Care | Attending: Student | Admitting: Student

## 2023-11-17 ENCOUNTER — Other Ambulatory Visit: Payer: Self-pay

## 2023-11-17 DIAGNOSIS — S0990XA Unspecified injury of head, initial encounter: Secondary | ICD-10-CM | POA: Insufficient documentation

## 2023-11-17 DIAGNOSIS — F10129 Alcohol abuse with intoxication, unspecified: Secondary | ICD-10-CM | POA: Diagnosis not present

## 2023-11-17 DIAGNOSIS — Z79899 Other long term (current) drug therapy: Secondary | ICD-10-CM | POA: Diagnosis not present

## 2023-11-17 DIAGNOSIS — W01198A Fall on same level from slipping, tripping and stumbling with subsequent striking against other object, initial encounter: Secondary | ICD-10-CM | POA: Insufficient documentation

## 2023-11-17 DIAGNOSIS — W19XXXA Unspecified fall, initial encounter: Secondary | ICD-10-CM

## 2023-11-17 DIAGNOSIS — F10920 Alcohol use, unspecified with intoxication, uncomplicated: Secondary | ICD-10-CM

## 2023-11-17 DIAGNOSIS — I10 Essential (primary) hypertension: Secondary | ICD-10-CM | POA: Insufficient documentation

## 2023-11-17 DIAGNOSIS — Y908 Blood alcohol level of 240 mg/100 ml or more: Secondary | ICD-10-CM | POA: Insufficient documentation

## 2023-11-17 DIAGNOSIS — R7401 Elevation of levels of liver transaminase levels: Secondary | ICD-10-CM | POA: Insufficient documentation

## 2023-11-17 LAB — CBC WITH DIFFERENTIAL/PLATELET
Abs Immature Granulocytes: 0.03 10*3/uL (ref 0.00–0.07)
Basophils Absolute: 0.1 10*3/uL (ref 0.0–0.1)
Basophils Relative: 1 %
Eosinophils Absolute: 0 10*3/uL (ref 0.0–0.5)
Eosinophils Relative: 0 %
HCT: 35.7 % — ABNORMAL LOW (ref 39.0–52.0)
Hemoglobin: 12.3 g/dL — ABNORMAL LOW (ref 13.0–17.0)
Immature Granulocytes: 0 %
Lymphocytes Relative: 41 %
Lymphs Abs: 3.3 10*3/uL (ref 0.7–4.0)
MCH: 34.6 pg — ABNORMAL HIGH (ref 26.0–34.0)
MCHC: 34.5 g/dL (ref 30.0–36.0)
MCV: 100.6 fL — ABNORMAL HIGH (ref 80.0–100.0)
Monocytes Absolute: 0.8 10*3/uL (ref 0.1–1.0)
Monocytes Relative: 10 %
Neutro Abs: 3.9 10*3/uL (ref 1.7–7.7)
Neutrophils Relative %: 48 %
Platelets: 338 10*3/uL (ref 150–400)
RBC: 3.55 MIL/uL — ABNORMAL LOW (ref 4.22–5.81)
RDW: 14.9 % (ref 11.5–15.5)
WBC: 8.2 10*3/uL (ref 4.0–10.5)
nRBC: 0 % (ref 0.0–0.2)

## 2023-11-17 LAB — COMPREHENSIVE METABOLIC PANEL
ALT: 323 U/L — ABNORMAL HIGH (ref 0–44)
AST: 200 U/L — ABNORMAL HIGH (ref 15–41)
Albumin: 4.9 g/dL (ref 3.5–5.0)
Alkaline Phosphatase: 61 U/L (ref 38–126)
Anion gap: 14 (ref 5–15)
BUN: 13 mg/dL (ref 8–23)
CO2: 19 mmol/L — ABNORMAL LOW (ref 22–32)
Calcium: 9.5 mg/dL (ref 8.9–10.3)
Chloride: 104 mmol/L (ref 98–111)
Creatinine, Ser: 0.93 mg/dL (ref 0.61–1.24)
GFR, Estimated: >60 mL/min (ref >60)
Glucose, Bld: 81 mg/dL (ref 70–99)
Potassium: 3.8 mmol/L (ref 3.5–5.1)
Sodium: 137 mmol/L (ref 135–145)
Total Bilirubin: 0.7 mg/dL (ref 0.0–1.2)
Total Protein: 8.4 g/dL — ABNORMAL HIGH (ref 6.5–8.1)

## 2023-11-17 LAB — ETHANOL: Alcohol, Ethyl (B): 317 mg/dL (ref <10)

## 2023-11-17 MED ORDER — LORAZEPAM 1 MG PO TABS
2.0000 mg | ORAL_TABLET | Freq: Once | ORAL | Status: AC
  Administered 2023-11-17: 2 mg via ORAL
  Filled 2023-11-17: qty 2

## 2023-11-17 NOTE — ED Triage Notes (Signed)
 Patient brought in by EMS after having a fall today. Hit the back of his back with bruising noted to it. No LOC, no blood thinners. Patient has consumed large quantity of etoh. Drinks daily.

## 2023-11-17 NOTE — Discharge Instructions (Signed)
 Your labs are not critically abnormal tonight. And your xrays did not show any injury. You can be discharged home and are encouraged to follow up with Dr. Timothy Lasso given the increased number of falls recently.   Return to the ED with any new or concerning symptoms.

## 2023-11-17 NOTE — ED Provider Notes (Signed)
 [facility] AT [facility] Provider Note   CSN: [id number] Arrival date & time: 11/17/23  1851     History  Chief Complaint  Patient presents with   Fall   Alcohol Intoxication    Vernon Fowler is a 68 y.o. male.  Patient to ED by EMS after fall with head injury. He is unable to provide details of history. He admits to large quantity of alcohol today with history of chronic alcoholism and dependence. No vomiting. He is not medicinally anticoagulated. Discussed with sister, Corrie Dandy, who reports the fall was 4 days ago but her concern is that he has fallen multiple times with increasing frequency. She relays that he went to a 40-day alcohol rehab until the end of February but was drinking within a few days of being home. She feels he has a progressive cognitive decline.   The history is provided by the patient and the EMS personnel. No language interpreter was used.  Fall  Alcohol Intoxication       Home Medications Prior to Admission medications   Medication Sig Start Date End Date Taking? Authorizing Provider  b complex vitamins tablet Take 1 tablet by mouth daily.    [provider]  B Complex-Biotin-FA (SUPER QUINTS B-50) TABS Take by mouth. Patient not taking: Reported on 07/31/2023 09/03/19   [provider]  carboxymethylcellulose (REFRESH PLUS) 0.5 % SOLN Place 1 drop into both eyes 2 (two) times daily as needed (dry eyes).     [provider]  cholecalciferol (VITAMIN D3) 25 MCG (1000 UNIT) tablet Take by mouth. 09/03/19   [provider]  FLUoxetine (PROZAC) 20 MG capsule Take 20 mg by mouth daily. 07/27/23   [provider]  gabapentin (NEURONTIN) 600 MG tablet Take 600 mg by mouth 3 (three) times daily. Patient not taking: Reported on 07/31/2023 04/20/23   [provider]  hydrOXYzine (ATARAX) 25 MG tablet Take 25 mg by mouth at bedtime. Patient not taking: Reported on 07/31/2023     [provider]  irbesartan (AVAPRO) 150 MG tablet Take 150 mg by mouth daily as needed (His BP was dropping really low so his MD told him to take if only if his DBP>90). 03/26/21   [provider]  levothyroxine (SYNTHROID) 175 MCG tablet Take by mouth. 08/05/21   [provider]  LORazepam (ATIVAN) 1 MG tablet Take 1 mg by mouth as needed for anxiety. 06/15/23   [provider]  Multiple Vitamin (MULTIVITAMIN WITH MINERALS) TABS tablet Take 1 tablet by mouth daily. Men's 50+    [provider]  Omega-3 Fatty Acids (FISH OIL) 1000 MG CAPS Take by mouth. 09/03/19   [provider]  omeprazole (PRILOSEC) 40 MG capsule TAKE ONE CAPSULE BY MOUTH TWICE A DAY Patient taking differently: 40 mg 2 (two) times daily as needed (heartburn). 12/29/21   [name], MD  pregabalin (LYRICA) 150 MG capsule Take 1 capsule (150 mg total) by mouth 3 (three) times daily. No early refill 02/09/23 07/31/23  [name], PA-C  propranolol (INDERAL) 20 MG tablet Take 20 mg by mouth 2 (two) times daily.    [provider]  QUEtiapine (SEROQUEL) 50 MG tablet Take 50 mg by mouth at bedtime. 05/25/23   [provider]  rOPINIRole (REQUIP) 1 MG tablet Take 1 mg by mouth at bedtime. 07/20/23   [provider]  rosuvastatin (CRESTOR) 20 MG tablet Take 20 mg by mouth daily.  [provider]  sertraline (ZOLOFT) 100 MG tablet Take 2 tablets (200 mg total) by mouth daily. Patient not taking: Reported on 07/31/2023 11/16/22   [name], MD  thiamine (VITAMIN B1) 100 MG tablet Take 1 tablet by mouth daily.    [provider]  traZODone (DESYREL) 100 MG tablet Take 100 mg by mouth at bedtime. Patient not taking: Reported on 07/31/2023    [provider]      Allergies    Sulfa antibiotics    Review of Systems   Review of Systems  Physical Exam Updated Vital Signs BP (!) 146/87   Pulse 73   Temp 98.3 F (36.8  C) (Oral)   Resp 16   Ht 6\' 2"  (1.88 m)   Wt 98.9 kg   SpO2 100%   BMI 27.99 kg/m  Physical Exam Vitals and nursing note reviewed.  Constitutional:      Comments: Acutely intoxicated  HENT:     Head: Normocephalic and atraumatic.  Neck:     Comments: No midline or paracervical tenderness.  Cardiovascular:     Rate and Rhythm: Normal rate and regular rhythm.     Heart sounds: No murmur heard. Pulmonary:     Effort: Pulmonary effort is normal.     Breath sounds: Normal breath sounds.  Abdominal:     General: There is no distension.     Palpations: Abdomen is soft.     Tenderness: There is no abdominal tenderness.  Musculoskeletal:        General: Normal range of motion.     Cervical back: Normal range of motion and neck supple.  Skin:    General: Skin is warm and dry.     Findings: No bruising or erythema.  Neurological:     Mental Status: He is alert.     Comments: Patient oriented to person, unsure of date but knows the year.     ED Results / Procedures / Treatments   Labs (all labs ordered are listed, but only abnormal results are displayed) Labs Reviewed  CBC WITH DIFFERENTIAL/PLATELET - Abnormal; Notable for the following components:      Result Value   RBC 3.55 (*)    Hemoglobin 12.3 (*)    HCT 35.7 (*)    MCV 100.6 (*)    MCH 34.6 (*)    All other components within normal limits  ETHANOL - Abnormal; Notable for the following components:   Alcohol, Ethyl (B) 317 (*)    All other components within normal limits  COMPREHENSIVE METABOLIC PANEL - Abnormal; Notable for the following components:   CO2 19 (*)    Total Protein 8.4 (*)    AST 200 (*)    ALT 323 (*)    All other components within normal limits   Results for orders placed or performed during the hospital encounter of 11/17/23  CBC with Differential   Collection Time: 11/17/23  7:49 PM  Result Value Ref Range   WBC 8.2 4.0 - 10.5 K/uL   RBC 3.55 (L) 4.22 - 5.81 MIL/uL   Hemoglobin 12.3 (L)  13.0 - 17.0 g/dL   HCT 13.0 (L) 86.5 - 78.4 %   MCV 100.6 (H) 80.0 - 100.0 fL   MCH 34.6 (H) 26.0 - 34.0 pg   MCHC 34.5 30.0 - 36.0 g/dL   RDW 69.6 29.5 - 28.4 %   Platelets 338 150 - 400 K/uL   nRBC 0.0 0.0 - 0.2 %   Neutrophils  Relative % 48 %   Neutro Abs 3.9 1.7 - 7.7 K/uL   Lymphocytes Relative 41 %   Lymphs Abs 3.3 0.7 - 4.0 K/uL   Monocytes Relative 10 %   Monocytes Absolute 0.8 0.1 - 1.0 K/uL   Eosinophils Relative 0 %   Eosinophils Absolute 0.0 0.0 - 0.5 K/uL   Basophils Relative 1 %   Basophils Absolute 0.1 0.0 - 0.1 K/uL   Immature Granulocytes 0 %   Abs Immature Granulocytes 0.03 0.00 - 0.07 K/uL  Ethanol   Collection Time: 11/17/23  7:49 PM  Result Value Ref Range   Alcohol, Ethyl (B) 317 (HH) <10 mg/dL  Comprehensive metabolic panel   Collection Time: 11/17/23  7:49 PM  Result Value Ref Range   Sodium 137 135 - 145 mmol/L   Potassium 3.8 3.5 - 5.1 mmol/L   Chloride 104 98 - 111 mmol/L   CO2 19 (L) 22 - 32 mmol/L   Glucose, Bld 81 70 - 99 mg/dL   BUN 13 8 - 23 mg/dL   Creatinine, Ser 1.61 0.61 - 1.24 mg/dL   Calcium 9.5 8.9 - 09.6 mg/dL   Total Protein 8.4 (H) 6.5 - 8.1 g/dL   Albumin 4.9 3.5 - 5.0 g/dL   AST 045 (H) 15 - 41 U/L   ALT 323 (H) 0 - 44 U/L   Alkaline Phosphatase 61 38 - 126 U/L   Total Bilirubin 0.7 0.0 - 1.2 mg/dL   GFR, Estimated >40 >98 mL/min   Anion gap 14 5 - 15    EKG None  Radiology CT Head Wo Contrast Result Date: 11/17/2023 CLINICAL DATA:  Head and neck trauma, fall. EXAM: CT HEAD WITHOUT CONTRAST CT CERVICAL SPINE WITHOUT CONTRAST TECHNIQUE: Multidetector CT imaging of the head and cervical spine was performed following the standard protocol without intravenous contrast. Multiplanar CT image reconstructions of the cervical spine were also generated. RADIATION DOSE REDUCTION: This exam was performed according to the departmental dose-optimization program which includes automated exposure control, adjustment of the mA and/or kV  according to patient size and/or use of iterative reconstruction technique. COMPARISON:  07/30/2023. FINDINGS: CT HEAD FINDINGS Brain: No acute intracranial hemorrhage, midline shift or mass effect is seen. No extra-axial fluid collection. Diffuse atrophy is noted. Periventricular white matter hypodensities are present bilaterally. No hydrocephalus. Vascular: No hyperdense vessel or unexpected calcification. Skull: Normal. Negative for fracture or focal lesion. Sinuses/Orbits: No acute finding. Other: None. CT CERVICAL SPINE FINDINGS Alignment: Normal. Skull base and vertebrae: No acute fracture. No primary bone lesion or focal pathologic process. Soft tissues and spinal canal: No prevertebral fluid or swelling. No visible canal hematoma. Disc levels: Mild multilevel intervertebral disc space narrowing and disc osteophyte complexes, most pronounced at C5-C6. Upper chest: No acute abnormality. Other: None. IMPRESSION: 1. No acute intracranial process. 2. Atrophy with chronic microvascular ischemic changes. 3. Mild multilevel degenerative changes in the cervical spine without evidence of acute fracture. Electronically Signed   By: Thornell Sartorius M.D.   On: 11/17/2023 20:37   CT Cervical Spine Wo Contrast Result Date: 11/17/2023 CLINICAL DATA:  Head and neck trauma, fall. EXAM: CT HEAD WITHOUT CONTRAST CT CERVICAL SPINE WITHOUT CONTRAST TECHNIQUE: Multidetector CT imaging of the head and cervical spine was performed following the standard protocol without intravenous contrast. Multiplanar CT image reconstructions of the cervical spine were also generated. RADIATION DOSE REDUCTION: This exam was performed according to the departmental dose-optimization program which includes automated exposure control, adjustment of the mA and/or  kV according to patient size and/or use of iterative reconstruction technique. COMPARISON:  07/30/2023. FINDINGS: CT HEAD FINDINGS Brain: No acute intracranial hemorrhage, midline shift or  mass effect is seen. No extra-axial fluid collection. Diffuse atrophy is noted. Periventricular white matter hypodensities are present bilaterally. No hydrocephalus. Vascular: No hyperdense vessel or unexpected calcification. Skull: Normal. Negative for fracture or focal lesion. Sinuses/Orbits: No acute finding. Other: None. CT CERVICAL SPINE FINDINGS Alignment: Normal. Skull base and vertebrae: No acute fracture. No primary bone lesion or focal pathologic process. Soft tissues and spinal canal: No prevertebral fluid or swelling. No visible canal hematoma. Disc levels: Mild multilevel intervertebral disc space narrowing and disc osteophyte complexes, most pronounced at C5-C6. Upper chest: No acute abnormality. Other: None. IMPRESSION: 1. No acute intracranial process. 2. Atrophy with chronic microvascular ischemic changes. 3. Mild multilevel degenerative changes in the cervical spine without evidence of acute fracture. Electronically Signed   By: Thornell Sartorius M.D.   On: 11/17/2023 20:37    Procedures Procedures    Medications Ordered in ED Medications  LORazepam (ATIVAN) tablet 2 mg (2 mg Oral Given 11/17/23 2141)    ED Course/ Medical Decision Making/ A&P                                 Medical Decision Making This patient presents to the ED for concern of fall, this involves an extensive number of treatment options, and is a complaint that carries with it a high risk of complications and morbidity.  The differential diagnosis includes Head injury including IC bleed, skull fx; peripheral injury, fracture   Co morbidities that complicate the patient evaluation  Severe alcoholism, HLD, OSA, GERD, HTN, PUD, SBO   Additional history obtained:  Additional history and/or information obtained from chart review, notable for contact with sister, Corrie Dandy, with permission of the patient, who provided additional information regarding patient condition.   Lab Tests:  I Ordered, and personally  interpreted labs.  The pertinent results include:  LFT's: normal total bilirubin, AST 200, ALT 322. Normal hemoglobin    Imaging Studies ordered:  I ordered imaging studies including CT head and neck I independently visualized and interpreted imaging which showed negative for acute injury per radiologist interpretation   Cardiac Monitoring:  The patient was maintained on a cardiac monitor.  I personally viewed and interpreted the cardiac monitored which showed an underlying rhythm of: n/a   Medicines ordered and prescription drug management:  I ordered medication including Ativan  for anxiety Reevaluation of the patient after these medicines showed that the patient improved I have reviewed the patients home medicines and have made adjustments as needed   Test Considered:  N/a   Critical Interventions:  N/a   Consultations Obtained:  I requested consultation with the n/a,  and discussed lab and imaging findings as well as pertinent plan - they recommend: n/a   Problem List / ED Course:  Arrives via EMS for evaluation of fall 4 days ago.  Sister concerned for increasing number of fall recently. Labs done - no critical values identified. CT head and neck without injury VSS.  Sister Corrie Dandy is coming to pick him up. Encouraged to f/u with PCP.   Reevaluation:  After the interventions noted above, I reevaluated the patient and found that they have :improved   Social Determinants of Health:  Lives alone, chronic alcoholism   Disposition:  After consideration of the diagnostic results and  the patients response to treatment, I feel that the patient would benefit from discharge to home. Family to provide transportation.   Amount and/or Complexity of Data Reviewed Labs: ordered. Radiology: ordered.  Risk Prescription drug management.           Final Clinical Impression(s) / ED Diagnoses Final diagnoses:  Alcoholic intoxication without complication  [facility])  Fall, initial encounter    Rx / DC Orders ED Discharge Orders     None         Danne Harbor 11/17/23 2153    Glendora Score, MD 11/18/23 1217

## 2024-06-22 ENCOUNTER — Other Ambulatory Visit: Payer: Self-pay | Admitting: Internal Medicine

## 2024-06-22 DIAGNOSIS — I2510 Atherosclerotic heart disease of native coronary artery without angina pectoris: Secondary | ICD-10-CM

## 2024-06-22 DIAGNOSIS — I7121 Aneurysm of the ascending aorta, without rupture: Secondary | ICD-10-CM

## 2024-06-22 DIAGNOSIS — I712 Thoracic aortic aneurysm, without rupture, unspecified: Secondary | ICD-10-CM

## 2024-06-22 DIAGNOSIS — Z0000 Encounter for general adult medical examination without abnormal findings: Secondary | ICD-10-CM

## 2024-07-02 ENCOUNTER — Ambulatory Visit
Admission: RE | Admit: 2024-07-02 | Discharge: 2024-07-02 | Disposition: A | Discharge status: Home / Self Care | Source: Ambulatory Visit | Attending: Internal Medicine | Admitting: Internal Medicine

## 2024-07-02 DIAGNOSIS — Z0000 Encounter for general adult medical examination without abnormal findings: Secondary | ICD-10-CM

## 2024-07-02 DIAGNOSIS — I7121 Aneurysm of the ascending aorta, without rupture: Secondary | ICD-10-CM

## 2024-07-02 DIAGNOSIS — I712 Thoracic aortic aneurysm, without rupture, unspecified: Secondary | ICD-10-CM

## 2024-07-02 DIAGNOSIS — I2510 Atherosclerotic heart disease of native coronary artery without angina pectoris: Secondary | ICD-10-CM

## 2024-07-02 MED ORDER — IOPAMIDOL (ISOVUE-370) INJECTION 76%
80.0000 mL | Freq: Once | INTRAVENOUS | Status: AC | PRN
  Administered 2024-07-02: 80 mL via INTRAVENOUS

## 2024-07-02 MED ORDER — IOHEXOL 350 MG/ML SOLN: 80.0000 mL | Freq: Once | INTRAVENOUS | Status: DC | PRN
